# Patient Record
Sex: Male | Born: 1940 | Race: White | Hispanic: No | Marital: Married | Smoking: Former smoker
Health system: Southern US, Community
[De-identification: ages and names within clinical notes are randomized; demographics above are authoritative.]

## PROBLEM LIST (undated history)

## (undated) DIAGNOSIS — I10 Essential (primary) hypertension: Secondary | ICD-10-CM

## (undated) DIAGNOSIS — I219 Acute myocardial infarction, unspecified: Secondary | ICD-10-CM

## (undated) DIAGNOSIS — T4145XA Adverse effect of unspecified anesthetic, initial encounter: Secondary | ICD-10-CM

## (undated) DIAGNOSIS — E785 Hyperlipidemia, unspecified: Secondary | ICD-10-CM

## (undated) DIAGNOSIS — Z9889 Other specified postprocedural states: Secondary | ICD-10-CM

## (undated) DIAGNOSIS — T8859XA Other complications of anesthesia, initial encounter: Secondary | ICD-10-CM

## (undated) DIAGNOSIS — R413 Other amnesia: Principal | ICD-10-CM

## (undated) DIAGNOSIS — I4891 Unspecified atrial fibrillation: Secondary | ICD-10-CM

## (undated) DIAGNOSIS — I469 Cardiac arrest, cause unspecified: Secondary | ICD-10-CM

## (undated) DIAGNOSIS — G4730 Sleep apnea, unspecified: Secondary | ICD-10-CM

## (undated) DIAGNOSIS — M1990 Unspecified osteoarthritis, unspecified site: Secondary | ICD-10-CM

## (undated) DIAGNOSIS — R112 Nausea with vomiting, unspecified: Secondary | ICD-10-CM

## (undated) DIAGNOSIS — I519 Heart disease, unspecified: Secondary | ICD-10-CM

## (undated) HISTORY — PX: OTHER SURGICAL HISTORY: SHX169

## (undated) HISTORY — PX: HAND SURGERY: SHX662

## (undated) HISTORY — DX: Cardiac arrest, cause unspecified: I46.9

## (undated) HISTORY — DX: Acute myocardial infarction, unspecified: I21.9

## (undated) HISTORY — PX: CAROTID STENT: SHX1301

## (undated) HISTORY — DX: Heart disease, unspecified: I51.9

## (undated) HISTORY — DX: Essential (primary) hypertension: I10

## (undated) HISTORY — DX: Other amnesia: R41.3

## (undated) HISTORY — DX: Hyperlipidemia, unspecified: E78.5

## (undated) HISTORY — PX: APPENDECTOMY: SHX54

## (undated) HISTORY — DX: Sleep apnea, unspecified: G47.30

## (undated) HISTORY — DX: Unspecified atrial fibrillation: I48.91

---

## 2001-07-29 ENCOUNTER — Encounter: Admission: RE | Admit: 2001-07-29 | Discharge: 2001-07-29 | Payer: Self-pay | Admitting: Family Medicine

## 2001-07-29 ENCOUNTER — Encounter: Payer: Self-pay | Admitting: Family Medicine

## 2002-08-25 ENCOUNTER — Encounter: Admission: RE | Admit: 2002-08-25 | Discharge: 2002-09-30 | Payer: Self-pay | Admitting: Family Medicine

## 2004-09-22 ENCOUNTER — Encounter: Admission: RE | Admit: 2004-09-22 | Discharge: 2004-09-22 | Payer: Self-pay | Admitting: Family Medicine

## 2004-09-24 ENCOUNTER — Encounter: Admission: RE | Admit: 2004-09-24 | Discharge: 2004-09-24 | Payer: Self-pay | Admitting: Family Medicine

## 2006-05-06 ENCOUNTER — Emergency Department (HOSPITAL_COMMUNITY): Admission: EM | Admit: 2006-05-06 | Discharge: 2006-05-06 | Payer: Self-pay | Admitting: Emergency Medicine

## 2007-06-26 ENCOUNTER — Ambulatory Visit: Payer: Self-pay | Admitting: Cardiovascular Disease

## 2007-06-27 ENCOUNTER — Inpatient Hospital Stay (HOSPITAL_COMMUNITY): Admission: EM | Admit: 2007-06-27 | Discharge: 2007-06-29 | Payer: Self-pay | Admitting: Emergency Medicine

## 2007-06-27 HISTORY — PX: CORONARY ANGIOPLASTY WITH STENT PLACEMENT: SHX49

## 2007-10-26 ENCOUNTER — Encounter: Payer: Self-pay | Admitting: Internal Medicine

## 2007-10-26 ENCOUNTER — Ambulatory Visit (HOSPITAL_BASED_OUTPATIENT_CLINIC_OR_DEPARTMENT_OTHER): Admission: RE | Admit: 2007-10-26 | Discharge: 2007-10-26 | Payer: Self-pay | Admitting: Cardiology

## 2007-11-01 ENCOUNTER — Ambulatory Visit: Payer: Self-pay | Admitting: Internal Medicine

## 2008-07-13 ENCOUNTER — Encounter (INDEPENDENT_AMBULATORY_CARE_PROVIDER_SITE_OTHER): Payer: Self-pay | Admitting: *Deleted

## 2009-04-13 ENCOUNTER — Ambulatory Visit: Payer: Self-pay | Admitting: Internal Medicine

## 2009-04-13 DIAGNOSIS — M109 Gout, unspecified: Secondary | ICD-10-CM | POA: Insufficient documentation

## 2009-04-13 DIAGNOSIS — G4733 Obstructive sleep apnea (adult) (pediatric): Secondary | ICD-10-CM | POA: Insufficient documentation

## 2009-04-13 DIAGNOSIS — I469 Cardiac arrest, cause unspecified: Secondary | ICD-10-CM | POA: Insufficient documentation

## 2009-04-13 DIAGNOSIS — I219 Acute myocardial infarction, unspecified: Secondary | ICD-10-CM | POA: Insufficient documentation

## 2009-04-13 DIAGNOSIS — E785 Hyperlipidemia, unspecified: Secondary | ICD-10-CM | POA: Insufficient documentation

## 2009-04-13 DIAGNOSIS — I10 Essential (primary) hypertension: Secondary | ICD-10-CM | POA: Insufficient documentation

## 2009-04-21 ENCOUNTER — Encounter: Payer: Self-pay | Admitting: Internal Medicine

## 2009-05-10 ENCOUNTER — Encounter: Payer: Self-pay | Admitting: Internal Medicine

## 2009-05-24 ENCOUNTER — Telehealth: Payer: Self-pay | Admitting: Internal Medicine

## 2009-05-25 ENCOUNTER — Ambulatory Visit: Payer: Self-pay | Admitting: Internal Medicine

## 2009-11-18 ENCOUNTER — Ambulatory Visit: Payer: Self-pay | Admitting: Internal Medicine

## 2010-02-07 ENCOUNTER — Ambulatory Visit: Payer: Self-pay | Admitting: Cardiology

## 2010-05-09 NOTE — Letter (Signed)
Summary: Recall Colonoscopy Date Change Letter  [facility]  [address] [place], [place] [address]   Phone: [phone]  Fax: [phone]      July 13, 2008 MRN: [id number]   Benjamin George [address] [place], [place]  [address]   Dear [name],   Previously you were recommended to have a repeat colonoscopy around this time. Your chart was recently reviewed by Dr. Jarold Motto of [facility]. Follow up colonoscopy is now recommended in May 2015. This revised recommendation is based on current, nationally recognized guidelines for colorectal cancer screening and polyp surveillance. These guidelines are endorsed by the American Cancer Society, [employer] on Colorectal Cancer as well as numerous other major medical organizations.  Please understand that our recommendation assumes that you do not have any new symptoms such as bleeding, a change in bowel habits, anemia, or significant abdominal discomfort. If you do have any concerning GI symptoms or want to discuss the guideline recommendations, please call to arrange an office visit at your earliest convenience. Otherwise we will keep you in our reminder system and contact you 1-2 months prior to the date listed above to schedule your next colonoscopy.  Thank you,  Vania Rea. Jarold Motto, M.D.  [facility] [phone]

## 2010-05-09 NOTE — Assessment & Plan Note (Signed)
Summary: cpap/jd   Copy to:  Dr. Delfin Edis. Primary Provider/Referring Provider:  Dr. Pearson Grippe  CC:  Sleep Consult-had sleep study 10/2007.  c/o waking up several times in the night for years. .  History of Present Illness: April 13, 2009- 70 yoM seen at kind request of Dr Deborah Chalk because of sleep apnea. He had history of snore and daytime sleepiness. A sleep study 10/26/07 showed moderate obstructive sleep apnea , AHI 16.4/hr, and Periodic Limb Movement with 8.2 arousals/hr. He didn't follow up to try cpap then. The issue is raised now because blood pressure control has been difficult. He is being told still of loud snoring. Nose is congested lying in bed, and he drinks water at night due to sore throats, Bedtime 930-10PM, 1/2 hr sleep latency, waking 2-3 times/ night before up at 8AM, no naps., infrequent caffeine. Exercises at gym but stays heavy. Previous septoplasty.  Preventive Screening-Counseling & Management  Alcohol-Tobacco     Smoking Status: quit  Current Medications (verified): 1)  Atenolol 50 Mg Tabs (Atenolol) .... Two Times A Day 2)  Imdur 30 Mg Xr24h-Tab (Isosorbide Mononitrate) .... Once Daily 3)  Lipitor 10 Mg Tabs (Atorvastatin Calcium) .... Once Daily 4)  Valturna 300-320 Mg Tabs (Aliskiren-Valsartan) .... Once Daily 5)  Allopurinol 100 Mg Tabs (Allopurinol) .... Once Daily 6)  Bayer Aspirin 325 Mg Tabs (Aspirin) .... Once Daily 7)  Multivitamins  Tabs (Multiple Vitamin) .... Once Daily 8)  Vitamin C 500 Mg Tabs (Ascorbic Acid) .... Once Daily  Allergies (verified): 1)  ! Vibramycin 2)  Codeine   Past History:  Family History: Last updated: 04/13/2009 Father- hx heart disease, died "Guillain Barre" MGM deceased from ca- ? type Mother- died Alzheimers  Social History: Last updated: 04/13/2009 married Patient states former smoker. quit in 1989.  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 type Mother- died Alzheimers  Social History: married Patient states former smoker. quit in 1989.  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  The patient denies shortness of breath with activity, shortness of breath at rest, productive cough, non-productive cough, coughing up blood, chest pain, acid heartburn, indigestion, loss of appetite, weight change, abdominal pain, difficulty swallowing, tooth/dental problems, headaches, sneezing, itching, ear ache, anxiety, depression, hand/feet swelling, joint stiffness or pain, rash, change in color of mucus, and fever.    Vital Signs:  Patient profile:   70 year old male Height:      71 inches Weight:      268 pounds BMI:     37.51 O2 Sat:      95 % on Room air Pulse rate:   57 / minute BP sitting:   124 / 86  (right arm) Cuff size:   large  Vitals Entered By: Gweneth Dimitri RN (April 13, 2009 10:39 AM)  O2 Flow:  Room air CC: Sleep Consult-had sleep study 10/2007.  c/o waking up several times  in the night for years.  Comments Medications reviewed with patient Gweneth Dimitri RN  April 13, 2009 10:40 AM     Physical Exam  Additional Exam:  General: A/Ox3; pleasant and cooperative, NAD, SKIN: no rash, lesions NODES: no lymphadenopathy HEENT: [place]/AT, EOM- WNL, Conjuctivae- clear, PERRLA, TM-WNL, Nose- clear, Throat- clear and  wnl, Mellampatti  III NECK: Supple w/ fair ROM, JVD- none, normal carotid impulses w/o bruits Thyroid- normal to palpation CHEST: Clear to P&A HEART: RRR, no m/g/r heard ABDOMEN: Soft and nl; nml bowel sounds; no organomegaly or masses noted ZOX:WRUE, nl pulses, no edema  NEURO: Grossly intact to observation      Impression & Recommendations:  Problem # 1:  OBSTRUCTIVE SLEEP APNEA (ICD-327.23) Moderate OSA when tested in 2009. Nothing suggests he has improved. We discussed sleep apnea as a possible contributor to his hypertension. We discussed the physiology and medical issues as well as common treatment modalities. We emphasized weight loss, sleep hygiene and driving safety. We wil get him started with autotitration CPAP, and convert to fixed pressure after download data is available. We can give temazepam to help if needed during the adjustment.  Medications Added to Medication List This Visit: 1)  Atenolol 50 Mg Tabs (Atenolol) .... Two times a day 2)  Imdur 30 Mg Xr24h-tab (Isosorbide mononitrate) .... Once daily 3)  Lipitor 10 Mg Tabs (Atorvastatin calcium) .... Once daily 4)  Valturna 300-320 Mg Tabs (Aliskiren-valsartan) .... Once daily 5)  Allopurinol 100 Mg Tabs (Allopurinol) .... Once daily 6)  Bayer Aspirin 325 Mg Tabs (Aspirin) .... Once daily 7)  Multivitamins Tabs (Multiple vitamin) .... Once daily 8)  Vitamin C 500 Mg Tabs (Ascorbic acid) .... Once daily 9)  Temazepam 15 Mg Caps (Temazepam) .Marland Kitchen.. 1 for sleep if needed 10)  Cpap   Other Orders: Consultation Level IV (45409) DME Referral (DME)  Patient Instructions: 1)  Please schedule a follow-up appointment in 71 month. 2)  See [facility] to get started with CPAP. Feel free to call us and the home care company as needed. 3)  Script for temazepam to help with sleep if needed Prescriptions: TEMAZEPAM 15 MG CAPS (TEMAZEPAM) 1 for sleep if needed  #20 x 1   Entered and Authorized by:   Waymon Budge MD   Signed by:    Waymon Budge MD on 04/13/2009   Method used:   Print then Give to Patient   RxID:   [id number]

## 2010-05-09 NOTE — Assessment & Plan Note (Signed)
Summary: 6 weeks/apc   Copy to:  Dr. Delfin Edis. Primary Provider/Referring Provider:  Dr. Pearson Grippe  CC:  Follow up visit-sleep; "Lightheaded" feelings..  History of Present Illness: April 13, 2009- 68 yoM seen at kind request of Dr Deborah Chalk because of sleep apnea. He had history of snore and daytime sleepiness. A sleep study 10/26/07 showed moderate obstructive sleep apnea , AHI 16.4/hr, and Periodic Limb Movement with 8.2 arousals/hr. He didn't follow up to try cpap then. The issue is raised now because blood pressure control has been difficult. He is being told still of loud snoring. Nose is congested lying in bed, and he drinks water at night due to sore throats, Bedtime 930-10PM, 1/2 hr sleep latency, waking 2-3 times/ night before up at 8AM, no naps., infrequent caffeine. Exercises at gym but stays heavy. Previous septoplasty.  May 25, 2009- OSA, CAD We have ordered CPAP change to fixed pressure at 11. He says he can use it every night, is comfortable, gets up much less, and is sleeping better. Full face mask and humidifier but still gets dry mouth at night.  November 18, 2009- OSA, CAD CPAP doesn't let him sleep quite as well- wakes more- and this is a change. Feels that lack of sleep makes him feel dizzy. Mouth is drier. Does sleep with CPAP every night and notes his BP has come down as he has been on CPAP. He's not sure what has changed about sleep quality and CPAP tolerance, since CPAP settings are not changed. Machine seems  to blow too hard.  Full face mask- known mouth breather.  Preventive Screening-Counseling & Management  Alcohol-Tobacco     Smoking Status: quit > 6 months  Current Medications (verified): 1)  Atenolol 50 Mg Tabs (Atenolol) .... Two Times A Day 2)  Imdur 30 Mg Xr24h-Tab (Isosorbide Mononitrate) .... Once Daily 3)  Lipitor 10 Mg Tabs (Atorvastatin Calcium) .... Once Daily 4)  Allopurinol 100 Mg Tabs (Allopurinol) .... Once Daily 5)  Bayer Aspirin 325  Mg Tabs (Aspirin) .... Once Daily 6)  Multivitamins  Tabs (Multiple Vitamin) .... Once Daily 7)  Vitamin C 500 Mg Tabs (Ascorbic Acid) .... Once Daily 8)  Cpap 11 [facility] 9)  Losartan Potassium-Hctz 100-12.5 Mg Tabs (Losartan Potassium-Hctz) .... Take 1 By Mouth Once Daily  Allergies: 1)  ! Vibramycin 2)  ! Hydrocodone 3)  ! * Pain Killers 4)  Codeine  Past History:  Past Medical History: Last updated: 04/13/2009 HYPERLIPIDEMIA (ICD-272.4) GOUT (ICD-274.9) SLEEP APNEA (ICD-780.57) NPSG 10/26/07- AHI 16.4 HEART ATTACK (ICD-410.90) HYPERTENSION (ICD-401.9) CARDIAC ARREST (ICD-427.5)  Past Surgical History: Last updated: 04/13/2009 angioplasty stent nasal septoplasty  Family History: Last updated: 04/13/2009 Father- hx heart disease, died "Guillain Barre" MGM deceased from ca- ? type Mother- died Alzheimers  Social History: Last updated: 04/13/2009 married Patient states former smoker. quit in 1989.  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    Vital Signs:  Patient profile:   70 year old male Height:      71 inches Weight:      271.13 pounds BMI:  37.95 O2 Sat:      96 % on Room air Pulse rate:   61 / minute BP sitting:   122 / 66  (left arm) Cuff size:   large  Vitals Entered By: Reynaldo Minium CMA (November 18, 2009 2:12 PM)  O2 Flow:  Room air CC: Follow up visit-sleep; "Lightheaded" feelings.   Physical Exam  Additional Exam:  General: A/Ox3; pleasant and cooperative, NAD, obese SKIN: no  rash, lesions NODES: no lymphadenopathy HEENT: [place]/AT, EOM- WNL, Conjuctivae- clear, PERRLA, TM-WNL, Nose- clear, Throat- clear and wnl, Mallampati  III NECK: Supple w/ fair ROM, JVD- none, normal carotid impulses w/o bruits Thyroid- normal to palpation CHEST: Clear to P&A HEART: RRR, no m/g/r heard ABDOMEN: Soft and nl; NFA:OZHY, nl pulses, no edema  NEURO: Grossly intact to observation      Impression & Recommendations:  Problem # 1:  OBSTRUCTIVE SLEEP APNEA (ICD-327.23)  I can't tell why he has begun having more CPAP discomfort. It sounds as if he may be over pressured. We will try empirical reduction to 10 cwp, whyich should help noise concern and drying.  Medications Added to Medication List This Visit: 1)  Cpap 10 [facility]   Other Orders: Est. Patient Level III (86578)  Patient Instructions: 1)  Please schedule a follow-up appointment in 6 months. 2)  Ask [facility] to reduce CPAP to 10. let me know if that is not comfortable.

## 2010-05-09 NOTE — Progress Notes (Signed)
Summary: Autotitrated cpap to 11  Phone Note Other Incoming   Summary of Call: [facility]- Auto cpap to 11. Good compliance and control. Initial call taken by: Waymon Budge MD,  May 24, 2009 10:22 PM    New/Updated Medications: * CPAP 11 [facility]

## 2010-05-09 NOTE — Assessment & Plan Note (Signed)
Summary: 4 weeks/ mbw   Copy to:  Dr. Delfin Edis. Primary Provider/Referring Provider:  Dr. Pearson Grippe  CC:  follow up visit-.  History of Present Illness: April 13, 2009- 70 yoM seen at kind request of Dr Deborah Chalk because of sleep apnea. He had history of snore and daytime sleepiness. A sleep study 10/26/07 showed moderate obstructive sleep apnea , AHI 16.4/hr, and Periodic Limb Movement with 8.2 arousals/hr. He didn't follow up to try cpap then. The issue is raised now because blood pressure control has been difficult. He is being told still of loud snoring. Nose is congested lying in bed, and he drinks water at night due to sore throats, Bedtime 930-10PM, 1/2 hr sleep latency, waking 2-3 times/ night before up at 8AM, no naps., infrequent caffeine. Exercises at gym but stays heavy. Previous septoplasty.  May 25, 2009- OSA, CAD We have ordered CPAP change to fixed pressure at 11. He says he can use it every night, is comfortable, gets up much less, and is sleeping better. Full face mask and humidifier but still gets dry mouth at night.    Current Medications (verified): 1)  Atenolol 50 Mg Tabs (Atenolol) .... Two Times A Day 2)  Imdur 30 Mg Xr24h-Tab (Isosorbide Mononitrate) .... Once Daily 3)  Lipitor 10 Mg Tabs (Atorvastatin Calcium) .... Once Daily 4)  Allopurinol 100 Mg Tabs (Allopurinol) .... Once Daily 5)  Bayer Aspirin 325 Mg Tabs (Aspirin) .... Once Daily 6)  Multivitamins  Tabs (Multiple Vitamin) .... Once Daily 7)  Vitamin C 500 Mg Tabs (Ascorbic Acid) .... Once Daily 8)  Cpap 11 [facility] 9)  Losartan Potassium-Hctz 100-12.5 Mg Tabs (Losartan Potassium-Hctz) .... Take 1 By Mouth Once Daily  Allergies: 1)  ! Vibramycin 2)  ! Hydrocodone 3)  Codeine  Past History:  Past Medical History: Last updated: 04/13/2009 HYPERLIPIDEMIA (ICD-272.4) GOUT (ICD-274.9) SLEEP APNEA (ICD-780.57) NPSG 10/26/07- AHI 16.4 HEART ATTACK (ICD-410.90) HYPERTENSION (ICD-401.9) CARDIAC  ARREST (ICD-427.5)  Past Surgical History: Last updated: 04/13/2009 angioplasty stent nasal septoplasty  Family History: Last updated: 04/13/2009 Father- hx heart disease, died "Guillain Barre" MGM deceased from ca- ? type Mother- died Alzheimers  Social History: Last updated: 04/13/2009 married Patient states former smoker. quit in 1989.  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    Vital Signs:  Patient profile:   70 year old male Height:      71 inches Weight:      274.38 pounds BMI:     38.41 O2 Sat:      97 % on Room air Pulse rate:   58 / minute BP sitting:   128 / 70  (left arm) Cuff size:   large  Vitals Entered By: Reynaldo Minium CMA (May 25, 2009 10:10 AM)  O2 Flow:  Room air  Physical Exam  Additional Exam:  General: A/Ox3; pleasant and cooperative, NAD, obese SKIN: no rash, lesions NODES: no lymphadenopathy HEENT: [place]/AT, EOM- WNL, Conjuctivae- clear, PERRLA, TM-WNL, Nose- clear, Throat- clear and wnl, Mellampatti  III NECK: Supple w/ fair ROM, JVD- none, normal carotid impulses w/o bruits Thyroid- normal to palpation CHEST: Clear to P&A HEART: RRR, no m/g/r heard ABDOMEN: Soft and nl;  ZOX:WRUE, nl pulses, no edema  NEURO: Grossly intact to observation      Impression & Recommendations:  Problem # 1:  OBSTRUCTIVE SLEEP APNEA (ICD-327.23)  Great compliance and control. We are changing to fixed pressure at 11. Weight loss would help.  Medications Added to Medication List This Visit: 1)  Losartan Potassium-hctz 100-12.5 Mg Tabs (Losartan potassium-hctz) .... Take 1 by mouth once  daily  Other Orders: Est. Patient Level III (45409)  Patient Instructions: 1)  Please schedule a follow-up appointment in 6 months. 2)  Continue CPAP. We are setting fixed pressure at 11. If you aren't comfortable let the home care company or me know.

## 2010-05-17 ENCOUNTER — Ambulatory Visit (INDEPENDENT_AMBULATORY_CARE_PROVIDER_SITE_OTHER): Payer: MEDICARE | Admitting: Internal Medicine

## 2010-05-17 ENCOUNTER — Encounter: Payer: Self-pay | Admitting: Internal Medicine

## 2010-05-17 DIAGNOSIS — J309 Allergic rhinitis, unspecified: Secondary | ICD-10-CM | POA: Insufficient documentation

## 2010-05-17 DIAGNOSIS — G4700 Insomnia, unspecified: Secondary | ICD-10-CM

## 2010-05-17 DIAGNOSIS — G4733 Obstructive sleep apnea (adult) (pediatric): Secondary | ICD-10-CM

## 2010-05-17 DIAGNOSIS — J302 Other seasonal allergic rhinitis: Secondary | ICD-10-CM | POA: Insufficient documentation

## 2010-05-25 NOTE — Assessment & Plan Note (Signed)
Summary: 6 mth//kp   Copy to:  Dr. Delfin Edis. Primary Provider/Referring Provider:  Dr. Pearson Grippe  CC:  6  month follow up visit-OSA; uses CPAP each night and no complaints..  History of Present Illness: May 25, 2009- OSA, CAD We have ordered CPAP change to fixed pressure at 11. He says he can use it every night, is comfortable, gets up much less, and is sleeping better. Full face mask and humidifier but still gets dry mouth at night.  November 18, 2009- OSA, CAD CPAP doesn't let him sleep quite as well- wakes more- and this is a change. Feels that lack of sleep makes him feel dizzy. Mouth is drier. Does sleep with CPAP every night and notes his BP has come down as he has been on CPAP. He's not sure what has changed about sleep quality and CPAP tolerance, since CPAP settings are not changed. Machine seems  to blow too hard.  Full face mask- known mouth breather.  May 17, 2010- OSA, CAD Nurse-CC: 6  month follow up visit-OSA; uses CPAP each night and no complaints. CPAP 10/ [facility]- Uses all night every night. Denies naps, but continues to wake for 2 hours, unable to get back to sleep. Denies snoring. Gets up to bathroom then can't get back to sleep- tosses and turns. Denies other significant health events since here last.  Admits to seasonal rhinitis Spring and Fall- I offerred help if needed.    Preventive Screening-Counseling & Management  Alcohol-Tobacco     Smoking Status: quit > 6 months     Packs/Day: 2.0     Year Started: age 53     Year Quit: 23 years ago  Current Medications (verified): 1)  Atenolol 50 Mg Tabs (Atenolol) .... Two Times A Day 2)  Imdur 30 Mg Xr24h-Tab (Isosorbide Mononitrate) .... Once Daily 3)  Lipitor 10 Mg Tabs (Atorvastatin Calcium) .... Once Daily 4)  Allopurinol 100 Mg Tabs (Allopurinol) .... Once Daily 5)  Bayer Aspirin 325 Mg Tabs (Aspirin) .... Once Daily 6)  Multivitamins  Tabs (Multiple Vitamin) .... Once Daily 7)   Vitamin C 500 Mg Tabs (Ascorbic Acid) .... Once Daily 8)  Cpap 10 [facility] 9)  Losartan Potassium-Hctz 100-12.5 Mg Tabs (Losartan Potassium-Hctz) .... Take 1 By Mouth Once Daily 10)  Aleve 220 Mg Tabs (Naproxen Sodium) .... Take 1-2 By Mouth Once Daily As Needed 11)  Meloxicam 7.5 Mg Tabs (Meloxicam) .... Take 1-2 By Mouth Once Daily As Needed  Allergies (verified): 1)  ! Vibramycin 2)  ! Hydrocodone 3)  ! * Pain Killers 4)  Codeine  Past History:  Past Medical History: Last updated: 04/13/2009 HYPERLIPIDEMIA (ICD-272.4) GOUT (ICD-274.9) SLEEP APNEA (ICD-780.57) NPSG 10/26/07- AHI 16.4 HEART ATTACK (ICD-410.90) HYPERTENSION (ICD-401.9) CARDIAC ARREST (ICD-427.5)  Past Surgical History: Last updated: 04/13/2009 angioplasty stent nasal septoplasty  Family History: Last updated: 04/13/2009 Father- hx heart disease, died "Guillain Barre" MGM deceased from ca- ? type Mother- died Alzheimers  Social History: Last updated: 04/13/2009 married Patient states former smoker. quit in 1989.  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  The patient denies shortness of breath at rest, productive cough,  non-productive cough, coughing up blood, chest pain, irregular heartbeats, acid heartburn, indigestion, loss of appetite, weight change, abdominal pain, difficulty swallowing, sore throat, tooth/dental problems, headaches, ear ache, rash, change in color of mucus, and fever.    Vital Signs:  Patient profile:   70 year old male Height:      71 inches Weight:      277.25 pounds BMI:     38.81 O2 Sat:      96 % on Room air Pulse rate:   62 / minute BP  sitting:   120 / 72  (right arm) Cuff size:   large  Vitals Entered By: Reynaldo Minium CMA (May 17, 2010 9:23 AM)  O2 Flow:  Room air CC: 6  month follow up visit-OSA; uses CPAP each night and no complaints.   Physical Exam  Additional Exam:  General: A/Ox3; pleasant and cooperative, NAD, obese SKIN: no rash, lesions NODES: no lymphadenopathy HEENT: [place]/AT, EOM- WNL, Conjuctivae- clear, PERRLA, TM-WNL, Nose- clear, Throat- clear and wnl, Mallampati  III NECK: Supple w/ fair ROM, JVD- none, normal carotid impulses w/o bruits Thyroid- normal to palpation CHEST: Clear to P&A HEART: RRR, no m/g/r heard ABDOMEN: Seriously overweight EAV:WUJW, nl pulses, no edema  NEURO: Grossly intact to observation      Impression & Recommendations:  Problem # 1:  OBSTRUCTIVE SLEEP APNEA (ICD-327.23)  Good compliance and control with CPAP- no changes indicated for now., Weight loss would help  Orders: Est. Patient Level IV (11914)  Problem # 2:  INSOMNIA (ICD-780.52)  Complaint of difficulty maintaining sleep. Discussed light levels when he wakes for bathroom at night. We will let him try Sonata for short half life.   His updated medication list for this problem includes:    Zaleplon 10 Mg Caps (Zaleplon) .Marland Kitchen... 1 for sleep if needed  Problem # 3:  ALLERGIC RHINITIS (ICD-477.9)  He admits seasonal rhinitis. It isn't obviously interfering with CPAP use or sleep. We can offer treatment if needed.   Orders: Est. Patient Level IV (78295)  Medications Added to Medication List This Visit: 1)  Aleve 220 Mg Tabs (Naproxen sodium) .... Take 1-2 by mouth once daily as needed 2)  Meloxicam 7.5 Mg Tabs (Meloxicam) .... Take 1-2 by mouth once daily as needed 3)  Zaleplon 10 Mg Caps (Zaleplon) .Marland Kitchen.. 1 for sleep if needed  Patient Instructions: 1)  Please schedule a follow-up appointment in 1 year. 2)  Try zaleplon to help get back to sleep if needed. 3)  Continue CPAP at 10   Prescriptions: ZALEPLON 10 MG CAPS (ZALEPLON) 1 for sleep if needed  #30 x 5   Entered and Authorized by:   Waymon Budge MD   Signed by:   Waymon Budge MD on 05/17/2010   Method used:   Print then Give to Patient   RxID:   [id number]

## 2010-08-22 NOTE — H&P (Signed)
NAME:  Benjamin George, Benjamin George NO.:  [id number]   MEDICAL RECORD NO.:  [id number]          PATIENT TYPE:  EMS   LOCATION:  MAJO                         FACILITY:  [facility]   PHYSICIAN:  Brayton El, MD    DATE OF BIRTH:  Oct 29, 1940   DATE OF ADMISSION:  06/26/2007  DATE OF DISCHARGE:                              HISTORY & PHYSICAL   The patient is going to [facility].  The doctor is Dr.  Deborah Chalk who is his primary cardiologist.   REFERRING PHYSICIAN:  Colleen Can. Deborah Chalk, M.D.   CHIEF COMPLAINT:  Chest pain and palpitations.   HISTORY OF PRESENT ILLNESS:  Benjamin George is a 70 year old white male  with a past medical history significant for coronary artery disease  status post myocardial infarction at the ge of 70 and subsequent PCA and  an additional PCI approximately 5 years ago, hypertension,  hyperlipidemia, chronic back pain, presenting with persistent  palpitations and new onset chest pain with exertion today.  Patient  states that he has had problems with palpitations over the years;  however, in the past several weeks they have been worsening, especially  with exertion.  He underwent an exercise stress Cardiolite two to three  days ago but is unaware of the results.  Today, while at a game, he had  palpitations and new onset of substernal chest discomfort without  radiation or associated symptoms with exertion.  He reported to the  emergency room where his EKG was within normal limits and his first set  of enzymes was negative.  Patient is currently chest pain free.  Other  than increase in frequency of his palpitations, the new-onset chest  pain, patient has been in his normal state of health.   PAST MEDICAL HISTORY:  As above.   SOCIAL HISTORY:  Patient has a history of tobacco use but stopped  smoking after his first MI, also uses alcohol occasionally.   FAMILY HISTORY:  Family history is positive for premature coronary  disease.   ALLERGIES:   Codeine and Vibramycin.   MEDICATIONS:  Patient is unaware of his doses, but he is taking aspirin,  Atenolol, Imdur, allopurinol, Lipitor, Lisinopril and Aleve.   REVIEW OF SYSTEMS:  As in HPI.  All other systems were reviewed and are  negative.   PHYSICAL EXAMINATION:  VITAL SIGNS:  Temperature of 97.8, blood pressure  110/60, pulse of 61, respirations 18, satting 97% on 2 liters O2.  GENERAL:  He is in no acute distress.  HEENT:  Normocephalic, atraumatic.  NECK:  Supple without JVD or carotid bruit.  HEART:  Regular rate and rhythm without murmur or gallop.  LUNGS:  Clear bilaterally.  ABDOMEN:  Soft, nontender, nondistended.  EXTREMITIES:  Without edema.  NEUROLOGIC:  Nonfocal.  SKIN:  Warm and dry without any rashes.  PSYCHIATRIC:  Patient is appropriate with normal levels of insight.   EKG, independently reviewed by myself, demonstrates a normal sinus  rhythm with first-degree AV block.  There are no ST or T-wave segment  abnormalities.   Labs from the emergency room show a sodium of 138,  potassium 4.6,  chloride 106, BUN 14, glucose 111.  White count 8.5, hemoglobin 13.9,  hematocrit 40.4, platelet count of 242.  Troponin less than 0.05.  CKMB  less than 1.0.  Myoglobin 43.8.   ASSESSMENT:  A 70 year old with known coronary disease, presenting with  new-onset chest pain.   PLAN:  We will admit the patient for observation, rule him out for acute  myocardial infarction.  We will continue his home medications and only  start anticoagulation if the patient were to rule in, at which point we  would start heparin and a IIb3 inhibitor.  Patient had a stress test  approximately 2-3 days ago.  Unfortunately, we do not have the results  of this stress test this evening.  His future management will be based  on the results of that stress test.  He will be made n.p.o. in case  further procedures are warranted.      Brayton El, MD  Electronically Signed     SGA/MEDQ   D:  06/27/2007  T:  06/27/2007  Job:  [id number]

## 2010-08-22 NOTE — Cardiovascular Report (Signed)
NAME:  VERDUN, RACKLEY NO.:  [id number]   MEDICAL RECORD NO.:  [id number]          PATIENT TYPE:  INP   LOCATION:  4733                         FACILITY:  [facility]   PHYSICIAN:  Colleen Can. Deborah Chalk, M.D.DATE OF BIRTH:  Apr 20, 1940   DATE OF PROCEDURE:  06/27/2007  DATE OF DISCHARGE:                            CARDIAC CATHETERIZATION   PROCEDURE:  Left heart catheterization with selective coronary  angiography, left ventricular angiography, with Angio-Seal.   TYPE AND SITE OF ENTRY:  Percutaneous right femoral artery.   CATHETERS:  6-French four curved Judkins right and left coronary  catheters, 6-French pigtail ventriculographic catheter.   CONTRAST MATERIAL:  Omnipaque.   MEDICATIONS GIVEN PRIOR TO PROCEDURE:  Valium 10 mg.   MEDICATIONS GIVEN DURING PROCEDURE:  Versed 2 mg IV.   COMMENTS:  The patient tolerated the procedure well.   HEMODYNAMIC DATA:  The aortic pressure initially was 134/74.  It  increased after the injection of the coronaries.  The left ventricular  pressure initially was 178/11-27.  Post angiograph left ventricular  angiogram, the LV was 164/10-26, and the aortic pressure was 151/75.   ANGIOGRAPHIC DATA:  1. Left main coronary artery is normal.  2. Left circumflex.  Left circumflex had a stent in its proximal      segment.  It bifurcated into two moderate-size marginal vessels,      and then two smaller branches on the inferior aspect of the heart.      There were minor irregularities in the left circumflex, but no      obstructive disease.  3. Left anterior descending.  The left anterior descending gave rise      to high diagonal vessel, as well as a left anterior descending that      continued to cross the apex.  Left anterior descending distally was      small and somewhat tortuous, but flow completed to his terminal      portions was brisk.  There was no significant obstructive disease,      only minor irregularities.  4. Right  coronary artery.  The right coronary artery was heavily      calcified.  There was 60% ostial stenosis at the level of heavy      calcification, but there was no catheter damping at the ostium.      There were irregularities throughout, with 40%-60% narrowing.  The      vessel overall was somewhat diffusely narrowed, but still a 2.5 mm      vessel.  There was good flow in the posterior descending and      posterolateral branches.   Left ventricular angiogram was performed in the RAO projection.  Overall  cardiac size was normal.  There was apical akinesis and inferior  hypokinesis.  The global ejection fraction would be estimated to be in  the 45% range.  There was no mitral regurgitation, intracardiac  calcification, or intracavitary filling defect.   OVERALL IMPRESSION:  1. 60% ostial stenosis in the right coronary artery, with diffuse      calcification and atherosclerosis in the right coronary artery,  with patent stent in the left circumflex, with scattered      irregularities in the left coronary tree.  2. Mild left ventricular dysfunction, with inferior hypokinesis and      apical akinesis, with ejection fraction of approximately 45%.   DISCUSSION:  In light of these findings, I think we can manage Mr.  Sizemore medically.  We will look for other sources of his chest pain.      Colleen Can. Deborah Chalk, M.D.  Electronically Signed     SNT/MEDQ  D:  06/27/2007  T:  06/27/2007  Job:  [id number]

## 2010-08-25 NOTE — Procedures (Signed)
NAME:  GAYLON, BENTZ NO.:  [id number]   MEDICAL RECORD NO.:  [id number]          PATIENT TYPE:  OUT   LOCATION:  SLEEP CENTER                 FACILITY:  [facility]   PHYSICIAN:  Clinton D. Maple Hudson, MD, FCCP, FACPDATE OF BIRTH:  04-18-39   DATE OF STUDY:                            NOCTURNAL POLYSOMNOGRAM   REFERRING PHYSICIAN:  Colleen Can. Deborah Chalk, M.D.   INDICATION FOR STUDY:  Hypersomnia with sleep apnea.   EPWORTH SLEEPINESS SCORE:  Indicated by patient as zero, BMI 34.4,  weight 254 pounds, height 72 inches, neck 19 inches.   MEDICATIONS:  Home medication charted and reviewed.   SLEEP ARCHITECTURE:  Total sleep time of 292.5 minutes with sleep  efficiency 80.1%.  Stage I was 11.3%.  Stage II 69.6%.  Stage III 4.8%.  REM 14.4% of total sleep time.  Sleep latency 10.5 minutes.  REM latency  95 minutes.  Awake after sleep onset 59 minutes.  Arousal index 16.8.  The first 2 hours of sleep were marked by frequent nonspecific wakings.  No bedtime medication taken.   RESPIRATORY DATA:  Apnea/hypopnea index (AHI) 16.4 per hour.  Eighty  events were scored including 6 obstructive apneas and 74 hypopneas.  Events were nonpositional, but more common while supine.  REM AHI 10 per  hour.  A diagnostic NPSG protocol was ordered and CPAP titration was not  attempted.   OXYGEN DATA:  Moderately loud snoring with oxygen desaturation to a  nadir of 83%.  Mean oxygen saturation through the study was 92% on room  air.   CARDIAC DATA:  Normal sinus rhythm.   MOVEMENT-PARASOMNIA:  Periodic limb movement with arousal.  A total of  439 limb jerks were counted averaging 90.1 per hour.  Of these, 40  events counted as periodic limb movement with arousal for an  index of  8.2 per hour.   IMPRESSIONS-RECOMMENDATIONS:  1. Moderate obstructive sleep apnea/hypopnea syndrome, AHI 16.4 per      hour with nonpositional events more common while supine.      Moderately loud snoring and  oxygen desaturation to a nadir of 83%.  2. A diagnostic NPSG protocol was ordered and followed.  Consider      return for CPAP titration or evaluate for alternative management as      appropriate.  3. Periodic limb movement syndrome with arousal, 8.2 per hour.  He may      benefit from specific therapy such as Requip or Mirapex if      clinically appropriate.  4. The patient states his practice at home is to drink water      throughout the night expecting nocturia 3-4 times.  He      had some difficulty returning to sleep after a bathroom trip.  He      woke at 3:53 a.m. claiming to be rested and requesting to the end      the study at that time.      Clinton D. Maple Hudson, MD, FCCP, FACP  Diplomate, Biomedical engineer of Sleep Medicine  Electronically Signed     CDY/MEDQ  D:  11/01/2007 11:48:11  T:  11/01/2007 14:34:16  Job:  [id number] 

## 2010-08-25 NOTE — Discharge Summary (Signed)
NAME:  Benjamin George, Benjamin George NO.:  [id number]   MEDICAL RECORD NO.:  [id number]          PATIENT TYPE:  INP   LOCATION:  4733                         FACILITY:  [facility]   PHYSICIAN:  Colleen Can. Deborah Chalk, M.D.DATE OF BIRTH:  1940/09/11   DATE OF ADMISSION:  06/26/2007  DATE OF DISCHARGE:  06/29/2007                               DISCHARGE SUMMARY   DISCHARGE DIAGNOSES:  1. Chest pain and palpitations with subsequent elective cardiac      catheterization showing a 60% ostial stenosis in the right coronary      with diffuse calcification and atherosclerosis in the right      coronary, patent stent in the left circumflex, scattered      irregularities in the left coronary tree, mild left ventricular      dysfunction with an ejection fraction of approximately 45%, he is      felt best to be served by medical management.  2. Symptomatic premature ventricular contractions, improved with      higher doses of beta-blocker.  3. Known ischemic heart disease with previous myocardial infarction at      age 32 and subsequent percutaneous coronary intervention and      stenting of the left circumflex 5 years ago.  4. Hypertension.  5. Hyperlipidemia.  6. Chronic back pain.  7. Obesity.   HISTORY OF PRESENT ILLNESS:  Benjamin George is a very pleasant 70 year old  white male who has known ischemic heart disease as well as hypertension  and high cholesterol.  He underwent a recent Cardiolite study for  followup, which did show inferior ischemia with normal left-ventricular  systolic function.  Our plans were to proceed on with cardiac  catheterization.  However, the patient presented to the emergency room  with complaints of chest pain and substernal chest discomfort while he  was attending a basketball game.  There was no radiation or associated  symptoms reported.  When he came to the emergency room, his EKG was  unremarkable, his cardiac enzymes were negative, he was subsequently  pain  free yet admitted for further evaluation.   Please see the history and physical for further patient's presentation  and profile.   LABORATORY DATA ON ADMISSION:  His cardiac enzymes were negative.  His  chemistries were normal.  CBC was normal as well.  His EKG was within  normal limits.  Chest x-ray showed left base atelectasis.  The heart  sounds and mediastinal contours were normal.   PROCEDURES PERFORMED:  1. Cardiac catheterization performed on June 27, 2007, those results      are as noted above.  2. Gallbladder ultrasound, basically negative, the pancreas was      obscured by bowel gas.  3. CT angiogram of the chest performed on June 28, 2007, showing no      evidence of pulmonary embolism.  There was cardiomegaly and      coronary artery calcifications with diffuse degenerative changes      noted throughout the thoracic spine.   HOSPITAL COURSE:  The patient was admitted electively.  He ruled out  negative for myocardial infarction.  We proceeded on the following  morning with cardiac catheterization, those results are as noted above.  He is felt to best be served by medical management in regards to his  coronary disease.  His medicines were adjusted accordingly and our plans  were for him to be discharged the next morning; however, he began to  have significant presyncopal type episodes with ambulation.  These were  witnessed by the on-call physician over the course of the weekend and  noted to correlate with PVCs.  His beta-blocker dose was once again  doubled with a nice response.  By Sunday, on June 29, 2007, he was  doing well without complaints and was felt to be satisfactory candidate  for discharge.   DISCHARGE CONDITION:  Stable.   DISCHARGE DIET:  Low salt, heart healthy.   ACTIVITY:  To be increased as tolerated.   He is to use an ice pack if needed to the groin.   DISCHARGE MEDICINES:  1. Lipitor 20 mg a day.  2. Enteric-coated aspirin 325 a day.  3.  Multivitamin daily.  4. Allopurinol 100 mg a day.  5. Atenolol was increased to 50 mg b.i.d.  6. Lisinopril 10 mg a day.  7. Nitro patch 0.4 mg a day, put on at bedtime and removed the      following day at supper time.  8. Protonix 40 mg a day.  9. Nitroglycerin p.r.n.   PLAN:  I will see him back in the office in 1 week, certainly sooner if  any problems arise in the interim.      Sharlee Blew, N.P.      Colleen Can. Deborah Chalk, M.D.  Electronically Signed    LC/MEDQ  D:  06/30/2007  T:  07/01/2007  Job:  [id number]

## 2010-10-16 ENCOUNTER — Other Ambulatory Visit: Payer: Self-pay | Admitting: Cardiology

## 2010-10-16 MED ORDER — ATORVASTATIN CALCIUM 10 MG PO TABS: 10.0000 mg | ORAL_TABLET | Freq: Every day | ORAL | Status: DC

## 2010-10-16 NOTE — Telephone Encounter (Signed)
Called in needing a refill of his lipitor, but, wanted to know if he can take the generic brand of lipitor instead because it is one third of the price. Please call back. I have pulled his chart.

## 2010-10-16 NOTE — Telephone Encounter (Signed)
Ok to switch to generic? 

## 2010-10-16 NOTE — Telephone Encounter (Signed)
Med refill sent to [facility] on [address] by e-scribe

## 2010-10-19 ENCOUNTER — Encounter: Payer: Self-pay | Admitting: Cardiology

## 2010-10-24 ENCOUNTER — Encounter: Payer: Self-pay | Admitting: Cardiology

## 2010-10-24 ENCOUNTER — Ambulatory Visit (INDEPENDENT_AMBULATORY_CARE_PROVIDER_SITE_OTHER): Payer: Medicare Other | Admitting: Cardiology

## 2010-10-24 VITALS — BP 143/81 | HR 59 | Resp 16 | Ht 72.0 in | Wt 266.0 lb

## 2010-10-24 DIAGNOSIS — E785 Hyperlipidemia, unspecified: Secondary | ICD-10-CM

## 2010-10-24 DIAGNOSIS — I10 Essential (primary) hypertension: Secondary | ICD-10-CM

## 2010-10-24 DIAGNOSIS — Z01818 Encounter for other preprocedural examination: Secondary | ICD-10-CM | POA: Insufficient documentation

## 2010-10-24 DIAGNOSIS — I2510 Atherosclerotic heart disease of native coronary artery without angina pectoris: Secondary | ICD-10-CM | POA: Insufficient documentation

## 2010-10-24 NOTE — Assessment & Plan Note (Signed)
Stable with no ischemic symptoms.  Continue ASA, beta blocker, Imdur, statin, ARB.  Last EF was 45% in 3/09, will get an echocardiogram to reassess LV systolic function.

## 2010-10-24 NOTE — Assessment & Plan Note (Signed)
BP slightly elevated here but has been well-controlled at home.

## 2010-10-24 NOTE — Progress Notes (Signed)
PCP: Dr. Selena Batten  70 yo patient with history of CAD presents for cardiology followup.  He has been seen by Dr. Deborah Chalk in the past, and I am seeing him for the first time today.  He had RCA angioplasty in 1990 and PCI to CFX in 1997. Left heart cath in 2009 showed 60% ostial RCA stenosis and patent CFX stent.  He has been doing well recently.  No chest pain. He does yardwork and works out at [employer] twice a week with no exertional dyspnea.  He takes his BP daily, and it has always been < 140 systolic when he checks it at home.  He continues to use his CPAP for OSA.  No claudication-type symptoms.  He has osteoarthritis of the left shoulder and is considering a shoulder replacement.   ECG: NSR, slight concave ST elevation in inferior leads.    PMH: 1. OSA: CPAP 2. HTN 3. Obesity 4. PVCs 5. CAD: Inferior MI in 1990 treated with tPA then RCA angioplasty.  PCI to CFX in 1997.  LHC (3/09) with 60% ostial RCA, patent CFX stent, EF 45%.  6. Gout 7. Low back pain   SH: Married, retired from [employer], stopped smoking in 1989, occasional ETOH.   FH: Premature CAD  ROS: All systems reviewed and negative except as per HPI.   Current Outpatient Prescriptions  Medication Sig Dispense Refill  . allopurinol (ZYLOPRIM) 100 MG tablet Take 100 mg by mouth daily.        Marland Kitchen aspirin 325 MG tablet Take 325 mg by mouth daily.        Marland Kitchen atenolol (TENORMIN) 50 MG tablet Take 50 mg by mouth 2 (two) times daily.        Marland Kitchen atorvastatin (LIPITOR) 10 MG tablet Take 1 tablet (10 mg total) by mouth daily.  30 tablet  5  . isosorbide mononitrate (IMDUR) 30 MG 24 hr tablet Take 30 mg by mouth daily.        Marland Kitchen losartan-hydrochlorothiazide (HYZAAR) 100-12.5 MG per tablet Take 1 tablet by mouth daily.        . meloxicam (MOBIC) 7.5 MG tablet Take 7.5-15 mg by mouth daily as needed.        . Multiple Vitamin (MULTIVITAMIN) tablet Take 1 tablet by mouth daily.        . naproxen sodium (ANAPROX) 220 MG tablet Take 220-440 mg by  mouth daily as needed.        . NON FORMULARY CPAP 10 advanced       . vitamin C (ASCORBIC ACID) 500 MG tablet Take 500 mg by mouth daily.          BP 143/81  Pulse 59  Resp 16  Ht 6' (1.829 m)  Wt 266 lb (120.657 kg)  BMI 36.08 kg/m2 General: NAD, obese Neck: Thick neck, no JVD, no thyromegaly or thyroid nodule.  Lungs: Clear to auscultation bilaterally with normal respiratory effort. CV: Nondisplaced PMI.  Heart regular S1/S2, no S3/S4, no murmur.  No peripheral edema.  No carotid bruit.  2+ PT pulse on left and trace PT pulse on right.  Abdomen: Soft, nontender, no hepatosplenomegaly, no distention.  Neurologic: Alert and oriented x 3.  Psych: Normal affect. Extremities: No clubbing or cyanosis.

## 2010-10-24 NOTE — Assessment & Plan Note (Signed)
He will see his PCP in 2 weeks and will get lipids at that time.  I asked him to send [place] a copy.  Goal LDL < 70.

## 2010-10-24 NOTE — Assessment & Plan Note (Signed)
Patient may undergo left shoulder surgery soon.  He has had no ischemic symptoms and has good exercise tolerance.  He should continue atenolol peri-operatively.  No stress testing is indicated at this time.

## 2010-10-24 NOTE — Patient Instructions (Signed)
Schedule an appointment for an echocardiogram.  Your physician wants you to follow-up in: 6 months with Dr Shirlee Latch.(January 2013). You will receive a reminder letter in the mail two months in advance. If you don't receive a letter, please call our office to schedule the follow-up appointment.

## 2010-11-01 ENCOUNTER — Encounter: Payer: Self-pay | Admitting: Cardiology

## 2010-11-09 ENCOUNTER — Telehealth: Payer: Self-pay | Admitting: Cardiology

## 2010-11-09 ENCOUNTER — Ambulatory Visit (HOSPITAL_COMMUNITY): Payer: Medicare Other | Attending: Internal Medicine

## 2010-11-09 DIAGNOSIS — I252 Old myocardial infarction: Secondary | ICD-10-CM | POA: Insufficient documentation

## 2010-11-09 DIAGNOSIS — G4730 Sleep apnea, unspecified: Secondary | ICD-10-CM | POA: Insufficient documentation

## 2010-11-09 DIAGNOSIS — E785 Hyperlipidemia, unspecified: Secondary | ICD-10-CM | POA: Insufficient documentation

## 2010-11-09 DIAGNOSIS — I10 Essential (primary) hypertension: Secondary | ICD-10-CM | POA: Insufficient documentation

## 2010-11-09 DIAGNOSIS — Z01810 Encounter for preprocedural cardiovascular examination: Secondary | ICD-10-CM | POA: Insufficient documentation

## 2010-11-09 DIAGNOSIS — Z87891 Personal history of nicotine dependence: Secondary | ICD-10-CM | POA: Insufficient documentation

## 2010-11-09 DIAGNOSIS — I2510 Atherosclerotic heart disease of native coronary artery without angina pectoris: Secondary | ICD-10-CM | POA: Insufficient documentation

## 2010-11-09 NOTE — Telephone Encounter (Signed)
Walk In Pt Form " Pt Dropped off Labs from [facility]" sent to Message Nurse  11/09/10/km

## 2010-11-24 ENCOUNTER — Other Ambulatory Visit: Payer: Self-pay | Admitting: *Deleted

## 2010-11-24 MED ORDER — ISOSORBIDE MONONITRATE ER 30 MG PO TB24: 30.0000 mg | ORAL_TABLET | Freq: Every day | ORAL | Status: DC

## 2010-11-24 NOTE — Telephone Encounter (Signed)
rx sent in today

## 2010-11-28 ENCOUNTER — Inpatient Hospital Stay (HOSPITAL_COMMUNITY): Admission: RE | Admit: 2010-11-28 | Payer: Medicare Other | Source: Ambulatory Visit | Admitting: Orthopedic Surgery

## 2011-01-01 LAB — BASIC METABOLIC PANEL
BUN: 13
CO2: 27
Calcium: 8.7
Chloride: 103
Creatinine, Ser: 0.94
GFR calc Af Amer: >60
GFR calc non Af Amer: >60
Glucose, Bld: 90
Potassium: 4.2
Sodium: 139

## 2011-01-01 LAB — CBC
HCT: 38.3 — ABNORMAL LOW
HCT: 40.4
Hemoglobin: 13.1
Hemoglobin: 13.9
MCHC: 34.3
MCHC: 34.5
MCV: 91.7
MCV: 92.4
Platelets: 212
Platelets: 242
RBC: 4.14 — ABNORMAL LOW
RBC: 4.41
RDW: 12.8
RDW: 13
WBC: 8.5
WBC: 8.5

## 2011-01-01 LAB — I-STAT 8, (EC8 V) (CONVERTED LAB)
Acid-Base Excess: 2
BUN: 14
Bicarbonate: 28.6 — ABNORMAL HIGH
Chloride: 106
Glucose, Bld: 111 — ABNORMAL HIGH
HCT: 42
Hemoglobin: 14.3
Operator id: 270651
Potassium: 4.6
Sodium: 138
TCO2: 30
pCO2, Ven: 50.1 — ABNORMAL HIGH
pH, Ven: 7.364 — ABNORMAL HIGH

## 2011-01-01 LAB — CK TOTAL AND CKMB (NOT AT ARMC)
CK, MB: 1.3
Relative Index: INVALID
Total CK: 65

## 2011-01-01 LAB — DIFFERENTIAL
Basophils Absolute: 0
Basophils Relative: 0
Eosinophils Absolute: 0.2
Eosinophils Relative: 2
Lymphocytes Relative: 27
Lymphs Abs: 2.3
Monocytes Absolute: 0.6
Monocytes Relative: 8
Neutro Abs: 5.4
Neutrophils Relative %: 63

## 2011-01-01 LAB — CARDIAC PANEL(CRET KIN+CKTOT+MB+TROPI)
CK, MB: 1.4
CK, MB: 1.4
Relative Index: INVALID
Relative Index: INVALID
Total CK: 63
Total CK: 65
Troponin I: 0.02
Troponin I: 0.04

## 2011-01-01 LAB — POCT CARDIAC MARKERS
CKMB, poc: <1 — ABNORMAL LOW
Myoglobin, poc: 43.8
Operator id: 270651
Troponin i, poc: <0.05

## 2011-01-01 LAB — POCT I-STAT CREATININE
Creatinine, Ser: 1.1
Operator id: 270651

## 2011-01-01 LAB — TROPONIN I: Troponin I: 0.02

## 2011-03-22 ENCOUNTER — Other Ambulatory Visit: Payer: Self-pay | Admitting: Orthopedic Surgery

## 2011-03-23 ENCOUNTER — Ambulatory Visit (INDEPENDENT_AMBULATORY_CARE_PROVIDER_SITE_OTHER): Payer: Medicare Other | Admitting: Cardiology

## 2011-03-23 ENCOUNTER — Encounter: Payer: Self-pay | Admitting: Cardiology

## 2011-03-23 ENCOUNTER — Ambulatory Visit: Payer: Medicare Other | Admitting: Cardiology

## 2011-03-23 DIAGNOSIS — E785 Hyperlipidemia, unspecified: Secondary | ICD-10-CM

## 2011-03-23 DIAGNOSIS — I10 Essential (primary) hypertension: Secondary | ICD-10-CM

## 2011-03-23 DIAGNOSIS — Z01818 Encounter for other preprocedural examination: Secondary | ICD-10-CM

## 2011-03-23 DIAGNOSIS — I2510 Atherosclerotic heart disease of native coronary artery without angina pectoris: Secondary | ICD-10-CM

## 2011-03-23 NOTE — Patient Instructions (Signed)
   Your physician wants you to follow-up in: 6 months with Dr McLean. (June 2013). You will receive a reminder letter in the mail two months in advance. If you don't receive a letter, please call our office to schedule the follow-up appointment.  

## 2011-03-25 NOTE — Assessment & Plan Note (Signed)
Stable with no ischemic symptoms.  Continue ASA, beta blocker, Imdur, statin, ARB.  EF was normal on recent echo.

## 2011-03-25 NOTE — Assessment & Plan Note (Signed)
Will have him ask PCP to fax over labs when they are next done.  Goal LDL < 70.

## 2011-03-25 NOTE — Assessment & Plan Note (Signed)
BP occasionally runs low in the afternoon.  I will have him take losartan/HCT in the evening rather than in the morning.

## 2011-03-25 NOTE — Progress Notes (Signed)
PCP: Dr. Selena Batten  70 yo patient with history of CAD presents for cardiology followup.  He had RCA angioplasty in 1990 and PCI to CFX in 1997. Left heart cath in 2009 showed 60% ostial RCA stenosis and patent CFX stent.  Echo in 8/12 showed normal EF with mild MR and AI.  He has been doing well recently.  No chest pain. He does yardwork and works out at [employer] twice a week with no exertional dyspnea.  He takes his BP daily, and it has always been < 140 systolic when he checks it at home.  His BP actually drops into the 90s systolic range sometimes in the afternoon, causing mild lightheadedness.  He continues to use his CPAP for OSA.  No claudication-type symptoms.  He is planning to have a shoulder replacement in January.   ECG: NSR, slight concave ST elevation in inferior leads.    PMH: 1. OSA: CPAP 2. HTN 3. Obesity 4. PVCs 5. CAD: Inferior MI in 1990 treated with tPA then RCA angioplasty.  PCI to CFX in 1997.  LHC (3/09) with 60% ostial RCA, patent CFX stent, EF 45%.  Echo (8/12): EF 55-65%, grade I diastolic dysfunction, mild MR, mild AI.  6. Gout 7. Low back pain   SH: Married, retired from [employer], stopped smoking in 1989, occasional ETOH.   FH: Premature CAD   Current Outpatient Prescriptions  Medication Sig Dispense Refill  . allopurinol (ZYLOPRIM) 100 MG tablet Take 100 mg by mouth daily.        Marland Kitchen aspirin 325 MG tablet Take 325 mg by mouth daily.        Marland Kitchen atenolol (TENORMIN) 50 MG tablet Take 50 mg by mouth 2 (two) times daily.        Marland Kitchen atorvastatin (LIPITOR) 10 MG tablet Take 1 tablet (10 mg total) by mouth daily.  30 tablet  5  . isosorbide mononitrate (IMDUR) 30 MG 24 hr tablet Take 1 tablet (30 mg total) by mouth daily.  30 tablet  6  . losartan-hydrochlorothiazide (HYZAAR) 100-12.5 MG per tablet Take 1 tablet by mouth daily.        . meloxicam (MOBIC) 7.5 MG tablet Take 7.5-15 mg by mouth daily as needed.        . Multiple Vitamin (MULTIVITAMIN) tablet Take 1 tablet by mouth  daily.        . naproxen sodium (ANAPROX) 220 MG tablet Take 220-440 mg by mouth daily as needed.        . NON FORMULARY CPAP 10 advanced       . vitamin C (ASCORBIC ACID) 500 MG tablet Take 500 mg by mouth daily.          BP 140/80  Pulse 68  Ht 6' (1.829 m)  Wt 119.75 kg (264 lb)  BMI 35.80 kg/m2 General: NAD, obese Neck: Thick neck, no JVD, no thyromegaly or thyroid nodule.  Lungs: Clear to auscultation bilaterally with normal respiratory effort. CV: Nondisplaced PMI.  Heart regular S1/S2, no S3/S4, no murmur.  No peripheral edema.  No carotid bruit.  2+ PT pulse on left and trace PT pulse on right.  Abdomen: Soft, nontender, no hepatosplenomegaly, no distention.  Neurologic: Alert and oriented x 3.  Psych: Normal affect. Extremities: No clubbing or cyanosis.

## 2011-03-25 NOTE — Assessment & Plan Note (Signed)
Patient will undergo left shoulder surgery soon.  He has had no ischemic symptoms and has good exercise tolerance.  He should continue atenolol peri-operatively.  No stress testing is indicated at this time.

## 2011-03-30 ENCOUNTER — Encounter (HOSPITAL_COMMUNITY): Payer: Self-pay

## 2011-04-12 ENCOUNTER — Encounter (HOSPITAL_COMMUNITY)
Admission: RE | Admit: 2011-04-12 | Discharge: 2011-04-12 | Disposition: A | Discharge status: Home / Self Care | Payer: Medicare Other | Source: Ambulatory Visit | Attending: Orthopedic Surgery | Admitting: Orthopedic Surgery

## 2011-04-12 ENCOUNTER — Encounter (HOSPITAL_COMMUNITY): Payer: Self-pay

## 2011-04-12 ENCOUNTER — Other Ambulatory Visit: Payer: Self-pay

## 2011-04-12 LAB — URINALYSIS, ROUTINE W REFLEX MICROSCOPIC
Bilirubin Urine: NEGATIVE
Glucose, UA: NEGATIVE mg/dL
Hgb urine dipstick: NEGATIVE
Ketones, ur: NEGATIVE mg/dL
Leukocytes, UA: NEGATIVE
Nitrite: NEGATIVE
Protein, ur: NEGATIVE mg/dL
Specific Gravity, Urine: 1.013 (ref 1.005–1.030)
Urobilinogen, UA: 0.2 mg/dL (ref 0.0–1.0)
pH: 6 (ref 5.0–8.0)

## 2011-04-12 LAB — CBC
HCT: 40.8 % (ref 39.0–52.0)
Hemoglobin: 14.1 g/dL (ref 13.0–17.0)
MCH: 31.4 pg (ref 26.0–34.0)
MCHC: 34.6 g/dL (ref 30.0–36.0)
MCV: 90.9 fL (ref 78.0–100.0)
Platelets: 206 10*3/uL (ref 150–400)
RBC: 4.49 MIL/uL (ref 4.22–5.81)
RDW: 12.5 % (ref 11.5–15.5)
WBC: 7.8 10*3/uL (ref 4.0–10.5)

## 2011-04-12 LAB — APTT: aPTT: 30 seconds (ref 24–37)

## 2011-04-12 LAB — COMPREHENSIVE METABOLIC PANEL
ALT: 28 U/L (ref 0–53)
AST: 19 U/L (ref 0–37)
Albumin: 3.8 g/dL (ref 3.5–5.2)
Alkaline Phosphatase: 65 U/L (ref 39–117)
BUN: 14 mg/dL (ref 6–23)
CO2: 27 mEq/L (ref 19–32)
Calcium: 9.4 mg/dL (ref 8.4–10.5)
Chloride: 101 mEq/L (ref 96–112)
Creatinine, Ser: 1.02 mg/dL (ref 0.50–1.35)
GFR calc Af Amer: 83 mL/min — ABNORMAL LOW (ref >90)
GFR calc non Af Amer: 72 mL/min — ABNORMAL LOW (ref >90)
Glucose, Bld: 114 mg/dL — ABNORMAL HIGH (ref 70–99)
Potassium: 4.4 mEq/L (ref 3.5–5.1)
Sodium: 136 mEq/L (ref 135–145)
Total Bilirubin: 0.5 mg/dL (ref 0.3–1.2)
Total Protein: 6.6 g/dL (ref 6.0–8.3)

## 2011-04-12 LAB — DIFFERENTIAL
Basophils Absolute: 0 10*3/uL (ref 0.0–0.1)
Basophils Relative: 0 % (ref 0–1)
Eosinophils Absolute: 0.2 10*3/uL (ref 0.0–0.7)
Eosinophils Relative: 3 % (ref 0–5)
Lymphocytes Relative: 32 % (ref 12–46)
Lymphs Abs: 2.5 10*3/uL (ref 0.7–4.0)
Monocytes Absolute: 0.6 10*3/uL (ref 0.1–1.0)
Monocytes Relative: 8 % (ref 3–12)
Neutro Abs: 4.4 10*3/uL (ref 1.7–7.7)
Neutrophils Relative %: 57 % (ref 43–77)

## 2011-04-12 LAB — ABO/RH: ABO/RH(D): AB POS

## 2011-04-12 LAB — SURGICAL PCR SCREEN
MRSA, PCR: NEGATIVE
Staphylococcus aureus: NEGATIVE

## 2011-04-12 LAB — TYPE AND SCREEN
ABO/RH(D): AB POS
Antibody Screen: NEGATIVE

## 2011-04-12 LAB — PROTIME-INR
INR: 0.99 (ref 0.00–1.49)
Prothrombin Time: 13.3 seconds (ref 11.6–15.2)

## 2011-04-12 NOTE — Pre-Procedure Instructions (Signed)
19 Benjamin George  04/12/2011   Your procedure is scheduled on:  Apr 16, 2010 Tuesday    Report to [facility] at 1140 AM.  Call this number if you have problems the morning of surgery: [phone]   Remember:   Do not eat food:After Midnight.  May have clear liquids: up to 4 Hours before arrival.  Clear liquids include soda, tea, black coffee, apple or grape juice, broth.  Take these medicines the morning of surgery with A SIP OF WATER: atenolol, imdur    Do not wear jewelry, make-up or nail polish.  Do not wear lotions, powders, or perfumes. You may wear deodorant.  Do not shave 48 hours prior to surgery.  Do not bring valuables to the hospital.  Contacts, dentures or bridgework may not be worn into surgery.  Leave suitcase in the car. After surgery it may be brought to your room.  For patients admitted to the hospital, checkout time is 11:00 AM the day of discharge.   Patients discharged the day of surgery will not be allowed to drive home.  Name and phone number of your driver: NA  Special Instructions: CHG Shower Use Special Wash: 1/2 bottle night before surgery and 1/2 bottle morning of surgery.   Please read over the following fact sheets that you were given: Pain Booklet, MRSA Information and Surgical Site Infection Prevention

## 2011-04-13 ENCOUNTER — Telehealth: Payer: Self-pay | Admitting: Cardiology

## 2011-04-13 NOTE — Telephone Encounter (Signed)
Advised pt to stay on ASA.

## 2011-04-13 NOTE — Telephone Encounter (Signed)
New Problem:    Patient is having shoulder surgery done by Dr. Jackquline Bosch and they would like to know if he should stop his asprin today. Please advise.

## 2011-04-16 MED ORDER — CEFAZOLIN SODIUM-DEXTROSE 2-3 GM-% IV SOLR
2.0000 g | INTRAVENOUS | Status: AC
  Administered 2011-04-17: 2 g via INTRAVENOUS
  Filled 2011-04-16: qty 50

## 2011-04-17 ENCOUNTER — Encounter (HOSPITAL_COMMUNITY): Payer: Self-pay | Admitting: Anesthesiology

## 2011-04-17 ENCOUNTER — Encounter (HOSPITAL_COMMUNITY)
Admission: RE | Disposition: A | Discharge status: Home / Self Care | Payer: Self-pay | Source: Ambulatory Visit | Attending: Orthopedic Surgery

## 2011-04-17 ENCOUNTER — Ambulatory Visit (HOSPITAL_COMMUNITY): Payer: Medicare Other

## 2011-04-17 ENCOUNTER — Inpatient Hospital Stay (HOSPITAL_COMMUNITY)
Admission: RE | Admit: 2011-04-17 | Discharge: 2011-04-19 | DRG: 484 | Disposition: A | Discharge status: Home / Self Care | Payer: Medicare Other | Source: Ambulatory Visit | Attending: Orthopedic Surgery | Admitting: Orthopedic Surgery

## 2011-04-17 ENCOUNTER — Ambulatory Visit (HOSPITAL_COMMUNITY): Payer: Medicare Other | Admitting: Anesthesiology

## 2011-04-17 DIAGNOSIS — G4733 Obstructive sleep apnea (adult) (pediatric): Secondary | ICD-10-CM | POA: Diagnosis present

## 2011-04-17 DIAGNOSIS — Z7982 Long term (current) use of aspirin: Secondary | ICD-10-CM

## 2011-04-17 DIAGNOSIS — Z888 Allergy status to other drugs, medicaments and biological substances status: Secondary | ICD-10-CM

## 2011-04-17 DIAGNOSIS — M19019 Primary osteoarthritis, unspecified shoulder: Principal | ICD-10-CM | POA: Diagnosis present

## 2011-04-17 DIAGNOSIS — I10 Essential (primary) hypertension: Secondary | ICD-10-CM | POA: Diagnosis present

## 2011-04-17 DIAGNOSIS — I2510 Atherosclerotic heart disease of native coronary artery without angina pectoris: Secondary | ICD-10-CM | POA: Diagnosis present

## 2011-04-17 DIAGNOSIS — M109 Gout, unspecified: Secondary | ICD-10-CM | POA: Diagnosis present

## 2011-04-17 DIAGNOSIS — I252 Old myocardial infarction: Secondary | ICD-10-CM

## 2011-04-17 DIAGNOSIS — E785 Hyperlipidemia, unspecified: Secondary | ICD-10-CM | POA: Diagnosis present

## 2011-04-17 DIAGNOSIS — M19012 Primary osteoarthritis, left shoulder: Secondary | ICD-10-CM

## 2011-04-17 DIAGNOSIS — Z886 Allergy status to analgesic agent status: Secondary | ICD-10-CM

## 2011-04-17 HISTORY — PX: TOTAL SHOULDER ARTHROPLASTY: SHX126

## 2011-04-17 SURGERY — ARTHROPLASTY, SHOULDER, TOTAL
Anesthesia: Choice | Site: Shoulder | Laterality: Left | Wound class: Clean

## 2011-04-17 MED ORDER — ACETAMINOPHEN 650 MG RE SUPP: 650.0000 mg | Freq: Four times a day (QID) | RECTAL | Status: DC | PRN

## 2011-04-17 MED ORDER — VITAMIN C 500 MG PO TABS
500.0000 mg | ORAL_TABLET | Freq: Every day | ORAL | Status: DC
  Administered 2011-04-18 – 2011-04-19 (×2): 500 mg via ORAL
  Filled 2011-04-17 (×3): qty 1

## 2011-04-17 MED ORDER — EPHEDRINE SULFATE 50 MG/ML IJ SOLN
INTRAMUSCULAR | Status: DC | PRN
  Administered 2011-04-17 (×3): 5 mg via INTRAVENOUS

## 2011-04-17 MED ORDER — NEOSTIGMINE METHYLSULFATE 1 MG/ML IJ SOLN
INTRAMUSCULAR | Status: DC | PRN
  Administered 2011-04-17: 5 mg via INTRAVENOUS

## 2011-04-17 MED ORDER — PHENYLEPHRINE HCL 10 MG/ML IJ SOLN
INTRAMUSCULAR | Status: DC | PRN
  Administered 2011-04-17 (×7): 40 ug via INTRAVENOUS
  Administered 2011-04-17: 80 ug via INTRAVENOUS

## 2011-04-17 MED ORDER — LACTATED RINGERS IV SOLN
INTRAVENOUS | Status: DC | PRN
  Administered 2011-04-17 (×3): via INTRAVENOUS

## 2011-04-17 MED ORDER — DOCUSATE SODIUM 100 MG PO CAPS
100.0000 mg | ORAL_CAPSULE | Freq: Two times a day (BID) | ORAL | Status: DC
  Administered 2011-04-18 – 2011-04-19 (×3): 100 mg via ORAL
  Filled 2011-04-17 (×4): qty 1

## 2011-04-17 MED ORDER — MENTHOL 3 MG MT LOZG: 1.0000 | LOZENGE | OROMUCOSAL | Status: DC | PRN

## 2011-04-17 MED ORDER — ASPIRIN 81 MG PO TABS: 160.0000 mg | ORAL_TABLET | Freq: Every day | ORAL | Status: DC

## 2011-04-17 MED ORDER — ALUM & MAG HYDROXIDE-SIMETH 200-200-20 MG/5ML PO SUSP: 30.0000 mL | ORAL | Status: DC | PRN

## 2011-04-17 MED ORDER — HYDROCHLOROTHIAZIDE 12.5 MG PO CAPS
12.5000 mg | ORAL_CAPSULE | Freq: Every day | ORAL | Status: DC
  Administered 2011-04-18 – 2011-04-19 (×2): 12.5 mg via ORAL
  Filled 2011-04-17 (×3): qty 1

## 2011-04-17 MED ORDER — ALLOPURINOL 100 MG PO TABS
100.0000 mg | ORAL_TABLET | Freq: Every day | ORAL | Status: DC
  Administered 2011-04-18 – 2011-04-19 (×2): 100 mg via ORAL
  Filled 2011-04-17 (×3): qty 1

## 2011-04-17 MED ORDER — METOCLOPRAMIDE HCL 5 MG/ML IJ SOLN
5.0000 mg | Freq: Three times a day (TID) | INTRAMUSCULAR | Status: DC | PRN
  Administered 2011-04-17: 10 mg via INTRAVENOUS
  Filled 2011-04-17: qty 2

## 2011-04-17 MED ORDER — KCL IN DEXTROSE-NACL 20-5-0.9 MEQ/L-%-% IV SOLN
INTRAVENOUS | Status: DC
  Administered 2011-04-17: 22:00:00 via INTRAVENOUS
  Filled 2011-04-17 (×7): qty 1000

## 2011-04-17 MED ORDER — LOSARTAN POTASSIUM 50 MG PO TABS
100.0000 mg | ORAL_TABLET | Freq: Every day | ORAL | Status: DC
  Administered 2011-04-18 – 2011-04-19 (×2): 100 mg via ORAL
  Filled 2011-04-17 (×3): qty 2

## 2011-04-17 MED ORDER — SENNA 8.6 MG PO TABS
1.0000 | ORAL_TABLET | Freq: Two times a day (BID) | ORAL | Status: DC
  Administered 2011-04-18 – 2011-04-19 (×3): 8.6 mg via ORAL
  Filled 2011-04-17 (×5): qty 1

## 2011-04-17 MED ORDER — LACTATED RINGERS IV SOLN
INTRAVENOUS | Status: DC
  Administered 2011-04-17: 13:00:00 via INTRAVENOUS

## 2011-04-17 MED ORDER — ASPIRIN 81 MG PO CHEW
162.0000 mg | CHEWABLE_TABLET | Freq: Every day | ORAL | Status: DC
  Administered 2011-04-18 – 2011-04-19 (×2): 162 mg via ORAL
  Filled 2011-04-17 (×3): qty 2

## 2011-04-17 MED ORDER — HYDROMORPHONE HCL PF 1 MG/ML IJ SOLN: 0.2500 mg | INTRAMUSCULAR | Status: DC | PRN

## 2011-04-17 MED ORDER — FENTANYL CITRATE 0.05 MG/ML IJ SOLN
INTRAMUSCULAR | Status: DC | PRN
  Administered 2011-04-17: 150 ug via INTRAVENOUS
  Administered 2011-04-17: 50 ug via INTRAVENOUS

## 2011-04-17 MED ORDER — VECURONIUM BROMIDE 10 MG IV SOLR
INTRAVENOUS | Status: DC | PRN
  Administered 2011-04-17: 10 mg via INTRAVENOUS
  Administered 2011-04-17: 1 mg via INTRAVENOUS
  Administered 2011-04-17: 5 mg via INTRAVENOUS

## 2011-04-17 MED ORDER — ONDANSETRON HCL 4 MG/2ML IJ SOLN
4.0000 mg | Freq: Four times a day (QID) | INTRAMUSCULAR | Status: DC | PRN
  Administered 2011-04-17: 4 mg via INTRAVENOUS

## 2011-04-17 MED ORDER — HETASTARCH-ELECTROLYTES 6 % IV SOLN
INTRAVENOUS | Status: DC | PRN
  Administered 2011-04-17: 17:00:00 via INTRAVENOUS

## 2011-04-17 MED ORDER — CEFAZOLIN SODIUM-DEXTROSE 2-3 GM-% IV SOLR
2.0000 g | Freq: Four times a day (QID) | INTRAVENOUS | Status: AC
  Administered 2011-04-17 – 2011-04-18 (×3): 2 g via INTRAVENOUS
  Filled 2011-04-17 (×3): qty 50

## 2011-04-17 MED ORDER — THROMBIN 5000 UNITS EX SOLR
CUTANEOUS | Status: DC | PRN
  Administered 2011-04-17: 5000 [IU] via TOPICAL

## 2011-04-17 MED ORDER — PROMETHAZINE HCL 25 MG/ML IJ SOLN
12.5000 mg | Freq: Four times a day (QID) | INTRAMUSCULAR | Status: DC | PRN
  Administered 2011-04-17: 12.5 mg via INTRAVASCULAR

## 2011-04-17 MED ORDER — FENTANYL CITRATE 0.05 MG/ML IJ SOLN
INTRAMUSCULAR | Status: AC
  Filled 2011-04-17: qty 2

## 2011-04-17 MED ORDER — DEXTROSE 5 % IV SOLN
500.0000 mg | Freq: Four times a day (QID) | INTRAVENOUS | Status: DC | PRN
  Filled 2011-04-17: qty 5

## 2011-04-17 MED ORDER — BISACODYL 5 MG PO TBEC: 5.0000 mg | DELAYED_RELEASE_TABLET | Freq: Every day | ORAL | Status: DC | PRN

## 2011-04-17 MED ORDER — PROMETHAZINE HCL 25 MG/ML IJ SOLN
INTRAMUSCULAR | Status: AC
  Administered 2011-04-17: 12.5 mg via INTRAVASCULAR
  Filled 2011-04-17: qty 1

## 2011-04-17 MED ORDER — LIDOCAINE HCL (CARDIAC) 20 MG/ML IV SOLN
INTRAVENOUS | Status: DC | PRN
  Administered 2011-04-17: 80 mg via INTRAVENOUS

## 2011-04-17 MED ORDER — POVIDONE-IODINE 7.5 % EX SOLN
Freq: Once | CUTANEOUS | Status: DC
  Filled 2011-04-17: qty 118

## 2011-04-17 MED ORDER — LOSARTAN POTASSIUM-HCTZ 100-12.5 MG PO TABS: 1.0000 | ORAL_TABLET | Freq: Every day | ORAL | Status: DC

## 2011-04-17 MED ORDER — ONDANSETRON HCL 4 MG PO TABS: 4.0000 mg | ORAL_TABLET | Freq: Four times a day (QID) | ORAL | Status: DC | PRN

## 2011-04-17 MED ORDER — VITAMIN C 500 MG PO TABS: 500.0000 mg | ORAL_TABLET | Freq: Every day | ORAL | Status: DC

## 2011-04-17 MED ORDER — MIDAZOLAM HCL 2 MG/2ML IJ SOLN
1.0000 mg | Freq: Once | INTRAMUSCULAR | Status: AC
  Administered 2011-04-17: 2 mg via INTRAVENOUS

## 2011-04-17 MED ORDER — DIPHENHYDRAMINE HCL 12.5 MG/5ML PO ELIX
12.5000 mg | ORAL_SOLUTION | ORAL | Status: DC | PRN
  Filled 2011-04-17: qty 10

## 2011-04-17 MED ORDER — GLYCOPYRROLATE 0.2 MG/ML IJ SOLN
INTRAMUSCULAR | Status: DC | PRN
  Administered 2011-04-17: .9 mg via INTRAVENOUS

## 2011-04-17 MED ORDER — ISOSORBIDE MONONITRATE ER 30 MG PO TB24
30.0000 mg | ORAL_TABLET | Freq: Every day | ORAL | Status: DC
  Administered 2011-04-18 – 2011-04-19 (×2): 30 mg via ORAL
  Filled 2011-04-17 (×3): qty 1

## 2011-04-17 MED ORDER — PROPOFOL 10 MG/ML IV EMUL
INTRAVENOUS | Status: DC | PRN
  Administered 2011-04-17: 200 mg via INTRAVENOUS

## 2011-04-17 MED ORDER — ZOLPIDEM TARTRATE 5 MG PO TABS: 5.0000 mg | ORAL_TABLET | Freq: Every evening | ORAL | Status: DC | PRN

## 2011-04-17 MED ORDER — METOCLOPRAMIDE HCL 5 MG PO TABS
5.0000 mg | ORAL_TABLET | Freq: Three times a day (TID) | ORAL | Status: DC | PRN
  Filled 2011-04-17: qty 2

## 2011-04-17 MED ORDER — ATENOLOL 50 MG PO TABS
50.0000 mg | ORAL_TABLET | Freq: Every day | ORAL | Status: DC
  Administered 2011-04-18 – 2011-04-19 (×2): 50 mg via ORAL
  Filled 2011-04-17 (×3): qty 1

## 2011-04-17 MED ORDER — ADULT MULTIVITAMIN W/MINERALS CH
1.0000 | ORAL_TABLET | Freq: Every day | ORAL | Status: DC
  Administered 2011-04-18 – 2011-04-19 (×2): 1 via ORAL
  Filled 2011-04-17 (×3): qty 1

## 2011-04-17 MED ORDER — MORPHINE SULFATE 2 MG/ML IJ SOLN
1.0000 mg | INTRAMUSCULAR | Status: DC | PRN
  Administered 2011-04-18: 1 mg via INTRAVENOUS
  Filled 2011-04-17: qty 1

## 2011-04-17 MED ORDER — SIMVASTATIN 20 MG PO TABS
20.0000 mg | ORAL_TABLET | Freq: Every day | ORAL | Status: DC
  Administered 2011-04-17 – 2011-04-19 (×3): 20 mg via ORAL
  Filled 2011-04-17 (×3): qty 1

## 2011-04-17 MED ORDER — PHENOL 1.4 % MT LIQD
1.0000 | OROMUCOSAL | Status: DC | PRN
  Filled 2011-04-17: qty 177

## 2011-04-17 MED ORDER — ONE-DAILY MULTI VITAMINS PO TABS: 1.0000 | ORAL_TABLET | Freq: Every day | ORAL | Status: DC

## 2011-04-17 MED ORDER — OXYCODONE-ACETAMINOPHEN 5-325 MG PO TABS
1.0000 | ORAL_TABLET | ORAL | Status: DC | PRN
  Administered 2011-04-18: 1 via ORAL
  Filled 2011-04-17: qty 1

## 2011-04-17 MED ORDER — MIDAZOLAM HCL 2 MG/2ML IJ SOLN
INTRAMUSCULAR | Status: AC
  Filled 2011-04-17: qty 2

## 2011-04-17 MED ORDER — ACETAMINOPHEN 325 MG PO TABS: 650.0000 mg | ORAL_TABLET | Freq: Four times a day (QID) | ORAL | Status: DC | PRN

## 2011-04-17 MED ORDER — SODIUM CHLORIDE 0.9 % IR SOLN
Status: DC | PRN
  Administered 2011-04-17: 1

## 2011-04-17 MED ORDER — DROPERIDOL 2.5 MG/ML IJ SOLN: 0.6250 mg | INTRAMUSCULAR | Status: DC | PRN

## 2011-04-17 MED ORDER — FENTANYL CITRATE 0.05 MG/ML IJ SOLN
50.0000 ug | INTRAMUSCULAR | Status: DC | PRN
  Administered 2011-04-17: 100 ug via INTRAVENOUS

## 2011-04-17 MED ORDER — METHOCARBAMOL 500 MG PO TABS
500.0000 mg | ORAL_TABLET | Freq: Four times a day (QID) | ORAL | Status: DC | PRN
  Administered 2011-04-18: 500 mg via ORAL
  Filled 2011-04-17: qty 1

## 2011-04-17 MED ORDER — ONDANSETRON HCL 4 MG/2ML IJ SOLN
INTRAMUSCULAR | Status: DC | PRN
  Administered 2011-04-17: 4 mg via INTRAVENOUS

## 2011-04-17 SURGICAL SUPPLY — 81 items
ASSEMBLY NECK TAPER FIXED 135 (Orthopedic Implant) ×1 IMPLANT
BLADE SAW SAG 73X25 THK (BLADE) ×1
BLADE SAW SGTL 73X25 THK (BLADE) ×1 IMPLANT
BLADE SURG 15 STRL LF DISP TIS (BLADE) ×1 IMPLANT
BLADE SURG 15 STRL SS (BLADE) ×2
BOWL SMART MIX CTS (DISPOSABLE) IMPLANT
CEMENT BONE DEPUY (Cement) ×2 IMPLANT
CHLORAPREP W/TINT 26ML (MISCELLANEOUS) ×2 IMPLANT
CLOTH BEACON ORANGE TIMEOUT ST (SAFETY) ×2 IMPLANT
COVER SURGICAL LIGHT HANDLE (MISCELLANEOUS) ×2 IMPLANT
DEPUY CMW 1 IMPLANT
DRAPE INCISE IOBAN 66X45 STRL (DRAPES) ×2 IMPLANT
DRAPE SURG 17X23 STRL (DRAPES) ×2 IMPLANT
DRAPE TABLE COVER HEAVY DUTY (DRAPES) ×1 IMPLANT
DRAPE U-SHAPE 47X51 STRL (DRAPES) ×2 IMPLANT
DRSG ADAPTIC 3X8 NADH LF (GAUZE/BANDAGES/DRESSINGS) ×1 IMPLANT
DRSG PAD ABDOMINAL 8X10 ST (GAUZE/BANDAGES/DRESSINGS) ×4 IMPLANT
ELECT REM PT RETURN 9FT ADLT (ELECTROSURGICAL) ×2
ELECTRODE REM PT RTRN 9FT ADLT (ELECTROSURGICAL) ×1 IMPLANT
EVACUATOR 1/8 PVC DRAIN (DRAIN) ×2 IMPLANT
GAUZE SPONGE 4X4 12PLY STRL LF (GAUZE/BANDAGES/DRESSINGS) ×1 IMPLANT
GLENOID ANCHOR PEG CROSSLK 48 (Orthopedic Implant) ×1 IMPLANT
GLOVE BIO SURGEON STRL SZ 6.5 (GLOVE) ×2 IMPLANT
GLOVE BIO SURGEON STRL SZ7 (GLOVE) ×1 IMPLANT
GLOVE BIO SURGEON STRL SZ7.5 (GLOVE) ×4 IMPLANT
GLOVE BIOGEL PI IND STRL 7.0 (GLOVE) IMPLANT
GLOVE BIOGEL PI IND STRL 7.5 (GLOVE) IMPLANT
GLOVE BIOGEL PI IND STRL 8 (GLOVE) ×1 IMPLANT
GLOVE BIOGEL PI INDICATOR 7.0 (GLOVE) ×1
GLOVE BIOGEL PI INDICATOR 7.5 (GLOVE) ×3
GLOVE BIOGEL PI INDICATOR 8 (GLOVE) ×1
GLOVE SURG SS PI 7.5 STRL IVOR (GLOVE) ×1 IMPLANT
GOWN PREVENTION PLUS XLARGE (GOWN DISPOSABLE) ×4 IMPLANT
GOWN STRL NON-REIN LRG LVL3 (GOWN DISPOSABLE) ×2 IMPLANT
GOWN STRL REIN XL XLG (GOWN DISPOSABLE) ×3 IMPLANT
HANDPIECE INTERPULSE COAX TIP (DISPOSABLE) ×2
HEAD ECC 48X18 (Orthopedic Implant) ×1 IMPLANT
HEMOSTAT SURGICEL 2X14 (HEMOSTASIS) ×2 IMPLANT
HOOD PEEL AWAY FACE SHEILD DIS (HOOD) ×4 IMPLANT
KIT BASIN OR (CUSTOM PROCEDURE TRAY) ×2 IMPLANT
KIT ROOM TURNOVER OR (KITS) ×2 IMPLANT
MANIFOLD NEPTUNE II (INSTRUMENTS) ×2 IMPLANT
NDL HYPO 25GX1X1/2 BEV (NEEDLE) ×1 IMPLANT
NDL MAYO TROCAR (NEEDLE) ×1 IMPLANT
NEEDLE HYPO 25GX1X1/2 BEV (NEEDLE) IMPLANT
NEEDLE MAYO TROCAR (NEEDLE) ×2 IMPLANT
NS IRRIG 1000ML POUR BTL (IV SOLUTION) ×2 IMPLANT
PACK SHOULDER (CUSTOM PROCEDURE TRAY) ×2 IMPLANT
PAD ARMBOARD 7.5X6 YLW CONV (MISCELLANEOUS) ×4 IMPLANT
PIN METAGLENE 2.5 (PIN) ×1 IMPLANT
RETRIEVER SUT HEWSON (MISCELLANEOUS) ×1 IMPLANT
SET HNDPC FAN SPRY TIP SCT (DISPOSABLE) ×1 IMPLANT
SLING ARM IMMOBILIZER LRG (SOFTGOODS) ×2 IMPLANT
SLING ARM IMMOBILIZER MED (SOFTGOODS) IMPLANT
SMARTMIX MINI TOWER (MISCELLANEOUS) ×2
SPONGE GAUZE 4X4 12PLY (GAUZE/BANDAGES/DRESSINGS) ×2 IMPLANT
SPONGE LAP 18X18 X RAY DECT (DISPOSABLE) ×2 IMPLANT
SPONGE LAP 4X18 X RAY DECT (DISPOSABLE) ×3 IMPLANT
STEM GLOBAL AP 12MM (Stem) ×1 IMPLANT
STRIP CLOSURE SKIN 1/2X4 (GAUZE/BANDAGES/DRESSINGS) ×3 IMPLANT
SUCTION FRAZIER TIP 10 FR DISP (SUCTIONS) ×2 IMPLANT
SUPPORT WRAP ARM LG (MISCELLANEOUS) ×1 IMPLANT
SUT ETHIBOND 2 OS 4 DA (SUTURE) ×3 IMPLANT
SUT FIBERWIRE #2 38 T-5 BLUE (SUTURE) ×6
SUT MNCRL AB 4-0 PS2 18 (SUTURE) ×2 IMPLANT
SUT SILK 2 0 TIES 17X18 (SUTURE)
SUT SILK 2-0 18XBRD TIE BLK (SUTURE) ×1 IMPLANT
SUT VIC AB 0 CT1 27 (SUTURE) ×2
SUT VIC AB 0 CT1 27XBRD ANBCTR (SUTURE) IMPLANT
SUT VIC AB 0 CTB1 27 (SUTURE) ×4 IMPLANT
SUT VIC AB 2-0 CT1 27 (SUTURE) ×4
SUT VIC AB 2-0 CT1 TAPERPNT 27 (SUTURE) ×3 IMPLANT
SUTURE FIBERWR #2 38 T-5 BLUE (SUTURE) ×5 IMPLANT
SYR CONTROL 10ML LL (SYRINGE) ×1 IMPLANT
SYRINGE TOOMEY DISP (SYRINGE) ×1 IMPLANT
TOWEL OR 17X24 6PK STRL BLUE (TOWEL DISPOSABLE) ×2 IMPLANT
TOWEL OR 17X26 10 PK STRL BLUE (TOWEL DISPOSABLE) ×2 IMPLANT
TOWER SMARTMIX MINI (MISCELLANEOUS) ×1 IMPLANT
TRAY FOLEY CATH 14FR (SET/KITS/TRAYS/PACK) IMPLANT
WATER STERILE IRR 1000ML POUR (IV SOLUTION) ×1 IMPLANT
YANKAUER SUCT BULB TIP NO VENT (SUCTIONS) ×2 IMPLANT

## 2011-04-17 NOTE — H&P (Signed)
Benjamin George is an 71 y.o. male.   Chief Complaint: L shoulder pain  HPI: L shoulder end stage arthritis, failed nonoperative treatment.  Past Medical History  Diagnosis Date  . HLD (hyperlipidemia)   . Gout   . Sleep apnea     NPSG 10/26/07 - AHI 16.4  . Heart attack   . HTN (hypertension)   . Cardiac arrest   . Gout     Past Surgical History  Procedure Date  . Angioplasty   . Carotid stent   . Nasal septopalsty   . Hand surgery     left     No family history on file. Social History:  reports that he has quit smoking. He does not have any smokeless tobacco history on file. He reports that he drinks alcohol. His drug history not on file.  Allergies:  Allergies  Allergen Reactions  . Codeine     REACTION: nausea  . Hydrocodone   . Vibramycin     Medications Prior to Admission  Medication Dose Route Frequency Provider Last Rate Last Dose  . ceFAZolin (ANCEF) IVPB 2 g/50 mL premix  2 g Intravenous 60 min Pre-Op Tsz-Yin So, PHARMD       Medications Prior to Admission  Medication Sig Dispense Refill  . allopurinol (ZYLOPRIM) 100 MG tablet Take 100 mg by mouth daily.        Marland Kitchen aspirin 325 MG tablet Take 325 mg by mouth daily.        Marland Kitchen atenolol (TENORMIN) 50 MG tablet Take 50 mg by mouth daily.       Marland Kitchen atorvastatin (LIPITOR) 10 MG tablet Take 1 tablet (10 mg total) by mouth daily.  30 tablet  5  . isosorbide mononitrate (IMDUR) 30 MG 24 hr tablet Take 1 tablet (30 mg total) by mouth daily.  30 tablet  6  . losartan-hydrochlorothiazide (HYZAAR) 100-12.5 MG per tablet Take 1 tablet by mouth daily.        . meloxicam (MOBIC) 7.5 MG tablet Take 7.5 mg by mouth daily as needed. For back pain.      . Multiple Vitamin (MULTIVITAMIN) tablet Take 1 tablet by mouth daily.          No results found for this or any previous visit (from the past 48 hour(s)). No results found.  Review of Systems  All other systems reviewed and are negative.    There were no vitals taken for  this visit. Physical Exam  Constitutional: He is oriented to person, place, and time. He appears well-developed and well-nourished.  HENT:  Head: Atraumatic.  Eyes: EOM are normal.  Neck: Neck supple.  Cardiovascular: Intact distal pulses.   Respiratory: Effort normal.  GI: Soft.  Musculoskeletal:       Left shoulder: He exhibits decreased range of motion and tenderness.  Neurological: He is alert and oriented to person, place, and time.  Skin: Skin is warm and dry.  Psychiatric: He has a normal mood and affect.     Assessment/Plan L shoulder OA, for  TSA. Risks / benefits of surgery discussed Consent on chart  NPO for OR Preop antibiotics   Ronnie Mallette WILLIAM 04/17/2011, 7:47 AM

## 2011-04-17 NOTE — Anesthesia Procedure Notes (Addendum)
Anesthesia Regional Block:  Interscalene brachial plexus block  Pre-Anesthetic Checklist: ,, timeout performed, Correct Patient, Correct Site, Correct Laterality, Correct Procedure,, site marked, risks and benefits discussed, Surgical consent,  Pre-op evaluation,  At surgeon's request and post-op pain management  Laterality: Left  Prep: chloraprep       Needles:  Injection technique: Single-shot  Needle Type: Echogenic Stimulator Needle     Needle Length: 5cm 5 cm Needle Gauge: 22 and 22 G    Additional Needles:  Procedures: ultrasound guided and nerve stimulator Interscalene brachial plexus block  Nerve Stimulator or Paresthesia:  Response: bicep contraction, 0.45 mA,   Additional Responses:   Narrative:  Start time: 04/17/2011 1:41 PM End time: 04/17/2011 1:53 PM Injection made incrementally with aspirations every 5 mL.  Performed by: Personally  Anesthesiologist: J. Adonis Huguenin, MD  Additional Notes: Functioning IV was confirmed and monitors applied.  A 50mm 22ga echogenic arrow stimulator was used. Sterile prep and drape,hand hygiene and sterile gloves were used.Ultrasound guidance: relevent anatomy identified, needle position confirmed, local anesthetic spread visualized around nerve(s)., vascular puncture avoided.  Image printed for medical record.  Negative aspiration and negative test dose prior to incremental administration of local anesthetic. The patient tolerated the procedure well.  Interscalene brachial plexus block Procedure Name: Intubation Date/Time: 04/17/2011 2:49 PM Performed by: Carmela Rima Pre-anesthesia Checklist: Patient identified, Timeout performed, Emergency Drugs available, Suction available and Patient being monitored Patient Re-evaluated:Patient Re-evaluated prior to inductionOxygen Delivery Method: Circle System Utilized Preoxygenation: Pre-oxygenation with 100% oxygen Intubation Type: IV induction Ventilation: Mask ventilation without  difficulty Laryngoscope Size: Mac and 4 Grade View: Grade II Tube type: Oral Tube size: 8.0 mm Number of attempts: 1 Placement Confirmation: ETT inserted through vocal cords under direct vision,  breath sounds checked- equal and bilateral,  positive ETCO2 and CO2 detector Secured at: 23 cm Tube secured with: Tape Dental Injury: Teeth and Oropharynx as per pre-operative assessment

## 2011-04-17 NOTE — Transfer of Care (Signed)
Immediate Anesthesia Transfer of Care Note  Patient: Benjamin George  Procedure(s) Performed:  TOTAL SHOULDER ARTHROPLASTY  Patient Location: PACU  Anesthesia Type: GA combined with regional for post-op pain  Level of Consciousness: awake, alert  and oriented  Airway & Oxygen Therapy: Patient Spontanous Breathing and Patient connected to face mask oxygen  Post-op Assessment: Report given to PACU RN, Post -op Vital signs reviewed and stable and Patient moving all extremities X 4  Post vital signs: Reviewed and stable  Complications: No apparent anesthesia complications

## 2011-04-17 NOTE — Preoperative (Signed)
Beta Blockers   Reason not to administer Beta Blockers:Not Applicable, took Atenolol this am

## 2011-04-17 NOTE — Anesthesia Preprocedure Evaluation (Addendum)
Anesthesia Evaluation  Patient identified by MRN, date of birth, ID band Patient awake    Reviewed: Allergy & Precautions, H&P , NPO status , Patient's Chart, lab work & pertinent test results  History of Anesthesia Complications Negative for: history of anesthetic complications  Airway Mallampati: II  Neck ROM: full    Dental  (+) Dental Advidsory Given   Pulmonary sleep apnea and Continuous Positive Airway Pressure Ventilation ,  clear to auscultation  Pulmonary exam normal       Cardiovascular hypertension, Pt. on home beta blockers and Pt. on medications + CAD and + Past MI + dysrhythmias Atrial Fibrillation Regular Normal    Neuro/Psych Negative Neurological ROS  Negative Psych ROS   GI/Hepatic negative GI ROS, Neg liver ROS,   Endo/Other  Negative Endocrine ROSMorbid obesity  Renal/GU negative Renal ROS     Musculoskeletal   Abdominal   Peds  Hematology   Anesthesia Other Findings   Reproductive/Obstetrics                          Anesthesia Physical Anesthesia Plan  ASA: III  Anesthesia Plan: General   Post-op Pain Management:    Induction: Intravenous  Airway Management Planned: Oral ETT  Additional Equipment:   Intra-op Plan:   Post-operative Plan: Extubation in OR  Informed Consent: I have reviewed the patients History and Physical, chart, labs and discussed the procedure including the risks, benefits and alternatives for the proposed anesthesia with the patient or authorized representative who has indicated his/her understanding and acceptance.     Plan Discussed with: CRNA and Surgeon  Anesthesia Plan Comments:         Anesthesia Quick Evaluation

## 2011-04-17 NOTE — Op Note (Signed)
Procedure(s): TOTAL SHOULDER ARTHROPLASTY Procedure Note  Benjamin George male 71 y.o. 04/17/2011  Procedure(s) and Anesthesia Type:    * TOTAL SHOULDER ARTHROPLASTY - Choice  Surgeon(s) and Role:    * Mable Paris, MD - Primary   Indications:  71 y.o. male  With endstage left shoulder arthritis. Pain and dysfunction interfered with quality of life and nonoperative treatment with activity modification, NSAIDS and injections failed.     Surgeon: Mable Paris   Assistants: Skip Mayer PA-C  Anesthesia: General endotracheal anesthesia and IV regional     Procedure Detail  TOTAL SHOULDER ARTHROPLASTY  Findings: Depuy total shoulder with size 12 stem, 48x18 ecc head and 48 APG glenoid.  Estimated Blood Loss:  200 mL         Drains: 1 medium hemovac  Blood Given: none          Specimens: none        Complications:  * No complications entered in OR log *         Disposition: PACU - hemodynamically stable.         Condition: stable    Procedure:   The patient was identified in the preoperative holding area where I personally marked the operative extremity after verifying with the patient and consent. He  was taken to the operating room where He was transferred to the   operative table.  The patient received an interscalene block in   the holding area by the attending anesthesiologist.  General anesthesia was induced   in the operating room without complication.  The patient did receive IV  Ancef prior to the commencement of the procedure.  The patient was   placed in the beach-chair position with the back raised about 30   degrees.  The nonoperative extremity and head and neck were carefully   positioned and padded protecting against neurovascular compromise.  The   left upper extremity was then prepped and draped in the standard sterile   fashion.    The appropriate operative time-out was performed with   Anesthesia, the perioperative  staff, as well as myself and we all agreed   that the left side was the correct operative site.  An approximately   10 cm incision was made from the tip of the coracoid to the center point of the   humerus at the level of the axilla.  Dissection was carried down sharply   through subcutaneous tissues and cephalic vein was identified and taken   laterally with the deltoid.  The pectoralis major was taken medially.  The   upper 1 cm of the pectoralis major was released from its attachment on   the humerus.  The clavipectoral fascia was incised just lateral to the   conjoined tendon.  This incision was carried up to but not into the   coracoacromial ligament.  Digital palpation was used to prove   integrity of the axillary nerve which was protected throughout the   procedure.  Musculocutaneous nerve was not palpated in the operative   field.  Conjoined tendon was then retracted gently medially and the   deltoid laterally.  Anterior circumflex humeral vessels were clamped and   coagulated.  The soft tissues overlying the biceps was incised and this   incision was carried across the transverse humeral ligament to the base   of the coracoid.  The biceps was tenodesed to the soft tissue just above   pectoralis major and the remaining portion of  the biceps superiorly was   excised.  An osteotomy was performed at the lesser tuberosity and the   subscapularis was freed from the underlying capsule.  Capsule was then   released all the way down to the 6 o'clock position of the humeral head.   The humeral head was then delivered with simultaneous adduction,   extension and external rotation.  All humeral osteophytes were removed   and the anatomic neck of the humerus was marked and cut free hand at   approximately 25 degrees retroversion within about 3 mm of the cuff   reflection posteriorly.  The head size was estimated to be a 48 medium   offset.  At that point, the humeral head was retracted  posteriorly with   a Fukuda retractor and the anterior-inferior capsule was excised.   Remaining portion of the capsule was released at the base of the   coracoid.  The remaining biceps anchor and the entire anterior-inferior   labrum was excised.  The posterior labrum was also excised but the   posterior capsule was not released.  The guidepin was placed bicortically with 0 elevated guide.  The reamer was used to ream to concentric bone with punctate bleeding.  This gave an excellent concentric surface.  The center hole was then drilled for an anchor peg glenoid followed by the three peripheral holes and 1 of the holes   exited the glenoid wall posteriorly.  I then pulse irrigated these holes and dried   them with Surgicel and thrombin.  The three peripheral holes were then   pressurized cemented except the post hole and the anchor peg glenoid was placed and impacted   with an excellent fit.  The glenoid was a 48 component.  The proximal humerus was then again exposed taking care not to displace the glenoid.    The humerus was then sequentially reamed going from 6 to 12 by 2 mm incriments. The 12 mm reamer was found to have appropriate cortical contact.  A   box osteotome was then used and a 12-mm broach.  The broach handle was   removed and the trial head was placed.   Calcar reamer was used.The eccentric 48 x 18 head fit best.  With the trial implantation of the component, there was   approximately 50% posterior translation with immediate snap back to the   anatomic position.  With forward elevation, there was no tendency   towards posterior subluxation.   The trial was removed and the final implant was prepared on a back table.  The implant was then impacted and   achieved excellent anatomic reconstruction of the proximal humerus.  #2 Fiberwire was placed around the neck prior to impaction.  The joint   was then copiously irrigated with pulse lavage.  The subscapularis and   lesser tuberosity  osteotomy were then repaired using three #2 FiberWires   One #2 Ethibond was placed at the rotator interval just above   the lesser tuberosity.  After repair of the lesser tuberosity, a medium   Hemovac was placed out anterolaterally and again copious irrigation was   used.   Skin was closed with 2-0 Vicryl sutures in the deep dermal layer and 4-0 Monocryl in a subcuticular  running fashion.  Sterile dressings were then applied including Steri- Strips, 4x4s, ABDs and tape.  The patient was placed in a sling and allowed to awaken from general anesthesia and taken to the recovery room in stable  condition.  POSTOPERATIVE PLAN:  Early passive range of motion will be allowed with the goal of 403 degrees external rotation and a 140 degrees forward elevation.  No internal rotation at this time.  No active motion of the arm until the lesser tuberosity heels.  The patient will likely be kept in the hospital for 2 days and then discharged home.

## 2011-04-17 NOTE — Anesthesia Postprocedure Evaluation (Signed)
  Anesthesia Post-op Note  Patient: Benjamin George  Procedure(s) Performed:  TOTAL SHOULDER ARTHROPLASTY  Patient Location: PACU  Anesthesia Type: General  Level of Consciousness: awake  Airway and Oxygen Therapy: Patient Spontanous Breathing  Post-op Pain: mild  Post-op Assessment: Post-op Vital signs reviewed  Post-op Vital Signs: stable  Complications: No apparent anesthesia complications

## 2011-04-18 ENCOUNTER — Encounter (HOSPITAL_COMMUNITY): Payer: Self-pay | Admitting: Orthopedic Surgery

## 2011-04-18 DIAGNOSIS — M19012 Primary osteoarthritis, left shoulder: Secondary | ICD-10-CM

## 2011-04-18 LAB — HEMOGLOBIN AND HEMATOCRIT, BLOOD
HCT: 32.5 % — ABNORMAL LOW (ref 39.0–52.0)
Hemoglobin: 11.2 g/dL — ABNORMAL LOW (ref 13.0–17.0)

## 2011-04-18 LAB — BASIC METABOLIC PANEL
BUN: 13 mg/dL (ref 6–23)
CO2: 26 mEq/L (ref 19–32)
Calcium: 8.3 mg/dL — ABNORMAL LOW (ref 8.4–10.5)
Chloride: 103 mEq/L (ref 96–112)
Creatinine, Ser: 0.94 mg/dL (ref 0.50–1.35)
GFR calc Af Amer: >90 mL/min (ref >90)
GFR calc non Af Amer: 82 mL/min — ABNORMAL LOW (ref >90)
Glucose, Bld: 132 mg/dL — ABNORMAL HIGH (ref 70–99)
Potassium: 4 mEq/L (ref 3.5–5.1)
Sodium: 139 mEq/L (ref 135–145)

## 2011-04-18 MED ORDER — HYDROCODONE-ACETAMINOPHEN 5-325 MG PO TABS
1.0000 | ORAL_TABLET | ORAL | Status: DC | PRN
  Administered 2011-04-18 – 2011-04-19 (×4): 1 via ORAL
  Filled 2011-04-18 (×5): qty 1

## 2011-04-18 NOTE — Progress Notes (Signed)
PATIENT ID: KAMIL MCHAFFIE  MRN: [id number]  DOB/AGE:  [birth year] / 71 y.o.  1 Day Post-Op Procedure(s) (LRB): TOTAL SHOULDER ARTHROPLASTY (Left)  Subjective: Pain is mild.  No c/o chest pain or SOB.   No c/o overnight.  Used home CPAP.   Objective: Vital signs in last 24 hours: Temp:  [97.7 F (36.5 C)-98.4 F (36.9 C)] 98.4 F (36.9 C) (01/09 0524) Pulse Rate:  [52-94] 73  (01/09 0524) Resp:  [16-28] 20  (01/09 0524) BP: (112-134)/(59-78) 134/78 mmHg (01/09 0524) SpO2:  [93 %-100 %] 94 % (01/09 0524)  Intake/Output from previous day: 01/08 0701 - 01/09 0700 In: 2550 [I.V.:2000; IV Piggyback:500] Out: 1250 [Urine:1050; Blood:200] Intake/Output this shift:    No results found for this basename: HGB:5 in the last 72 hours No results found for this basename: WBC:2,RBC:2,HCT:2,PLT:2 in the last 72 hours No results found for this basename: NA:2,K:2,CL:2,CO2:2,BUN:2,CREATININE:2,GLUCOSE:2,CALCIUM:2 in the last 72 hours No results found for this basename: LABPT:2,INR:2 in the last 72 hours  Physical Exam: Neurovascular intact Sensation intact distally Intact pulses distally Incision: dressing C/D/I Sling intact, drain d/c'd  Assessment/Plan: 1 Day Post-Op Procedure(s) (LRB): TOTAL SHOULDER ARTHROPLASTY (Left)   Advance diet Up with therapy.  OT to teach PROM program. D/C IV fluids Plan for discharge tomorrow Non Weight Bearing (NWB)  Labs pending.   Mable Paris 04/18/2011, 7:38 AM

## 2011-04-18 NOTE — Progress Notes (Signed)
Pt admitted for L total shoulder repair. Pt has a dry dressing to L shoulder and +cms of L hand. Ice prn to L shoulder. Pt has sling to L shoulder. Pt is alert and oriented x 3. Neuro check is negative. Heart rate regular rate and rhythm. No s/sx cardiac or resp distress and no c/o such. Lungs CTA. Pt has a history of sleep apnea and CPAP at bedside and pt is using while asleep. Pt repts that he has a history of postop nausea and vomiting and did have some yesterday PM but denies upon assessment. Will start pt on clear liquid diet this AM. Pt abdomen is soft flat nontender and nondistended but BS faint to nonexistent x 4. Pt denies passing gas. Pt voids in urinal without difficulty. Pt repts LBM 1/7. Pt is receiving stool softners per order. MD in this AM to d/c hemovac.

## 2011-04-18 NOTE — Progress Notes (Signed)
Occupational Therapy Evaluation Patient Details Name: Benjamin George MRN: [id number] DOB: 05-10-1940 Today's Date: 04/18/2011  Problem List:  Patient Active Problem List  Diagnoses  . HYPERLIPIDEMIA  . GOUT  . OBSTRUCTIVE SLEEP APNEA  . HYPERTENSION  . HEART ATTACK  . CARDIAC ARREST  . ALLERGIC RHINITIS  . INSOMNIA  . CAD (coronary artery disease)  . Preoperative evaluation to rule out surgical contraindication    Past Medical History:  Past Medical History  Diagnosis Date  . HLD (hyperlipidemia)   . Gout   . Sleep apnea     NPSG 10/26/07 - AHI 16.4  . Heart attack   . HTN (hypertension)   . Cardiac arrest   . Gout    Past Surgical History:  Past Surgical History  Procedure Date  . Angioplasty   . Carotid stent   . Nasal septopalsty   . Hand surgery     left   . Total shoulder arthroplasty 04/17/2011    Procedure: TOTAL SHOULDER ARTHROPLASTY;  Surgeon: Mable Paris, MD;  Location: [facility] OR;  Service: Orthopedics;  Laterality: Left;    OT Assessment/Plan/Recommendation OT Assessment Clinical Impression Statement: Pt s/p L total shoulder replacement with Dr. Ave Filter. Pt will benefit from skilled acute care OT to complete ADL education s/p shoulder relacement according to precautions ( NWB and no AROM) in addition to educating pt/wife on HEP of forward flexion to 140 degress with assist of RUE and ER to 40 with use of dowel. Pt to follow up with MD after D/C. Further OT will be established at that time. OT Recommendation/Assessment: Patient will need skilled OT in the acute care venue OT Problem List: Decreased strength;Decreased range of motion;Decreased knowledge of precautions;Impaired UE functional use;Pain Barriers to Discharge: None OT Therapy Diagnosis : Generalized weakness OT Plan OT Frequency: Min 2X/week OT Treatment/Interventions: Self-care/ADL training;Therapeutic exercise;Therapeutic activities;Patient/family education OT Recommendation Follow  Up Recommendations: Outpatient OT (as needed. TBA) Equipment Recommended: None recommended by OT Individuals Consulted Consulted and Agree with Results and Recommendations: Patient;Family member/caregiver Family Member Consulted: wife OT Goals Acute Rehab OT Goals OT Goal Formulation: With patient Time For Goal Achievement: 7 days ADL Goals Pt Will Perform Upper Body Bathing: with set-up;Standing at sink ADL Goal: Upper Body Bathing - Progress: Not met Pt Will Perform Upper Body Dressing: with min assist;Sit to stand from chair;with cueing (comment type and amount) ADL Goal: Upper Body Dressing - Progress: Not met Arm Goals Pt Will Perform AROM: Independently;to maintain range of motion;Left upper extremity;2 sets (L elbow/hand A/AAROM when out of sling to pain tolerance.) Arm Goal: AROM - Progress: Not met Pt Will Tolerate PROM: Other (comment);Left upper extremity (per Dr Ave Filter protocal with assist of RUE. ER 40 with dowe) Arm Goal: PROM - Progress: Progressing toward goal Miscellaneous OT Goals Miscellaneous OT Goal #1: pt'wife will donn/doff sling independently. OT Goal: Miscellaneous Goal #1 - Progress: Progressing toward goals  OT Evaluation Precautions/Restrictions  Precautions Precautions: Shoulder (Chandler protocal) Type of Shoulder Precautions: PROM only. shoulder forward flexion to 140 supine using RUE to guide L arm. 40 degress ER with use of dowel Precaution Comments: NWB. Sling at all times with exception of ADL Required Braces or Orthoses: Yes (sling) Restrictions Weight Bearing Restrictions: Yes LUE Weight Bearing: Non weight bearing Prior Functioning Home Living Lives With: Spouse Receives Help From: Family Type of Home: House Home Layout: Two level;Full bath on main level Home Access: Stairs to enter [employer] Shower/Tub: Health visitor: Standard Home Adaptive Equipment:  None Prior Function Level of Independence: Independent with basic  ADLs;Independent with homemaking with ambulation Able to Take Stairs?: Yes Driving: Yes Vocation: Retired ADL ADL Eating/Feeding: Set up Grooming: Minimal assistance Where Assessed - Grooming: Sitting, chair Upper Body Bathing: Minimal assistance;Performed Where Assessed - Upper Body Bathing: Sitting, chair Lower Body Bathing: Minimal assistance Where Assessed - Lower Body Bathing: Sit to stand from chair Upper Body Dressing: Moderate assistance;Simulated Where Assessed - Upper Body Dressing: Sitting, chair Lower Body Dressing: Minimal assistance;Simulated Where Assessed - Lower Body Dressing: Sit to stand from chair Toilet Transfer: Supervision/safety Toilet Transfer Method: Proofreader: Comfort height toilet Toileting - Clothing Manipulation: Performed;Independent Where Assessed - Toileting Clothing Manipulation: Standing Toileting - Hygiene: Independent Where Assessed - Toileting Hygiene: Standing Tub/Shower Transfer: Not assessed Equipment Used:  (sling) Ambulation Related to ADLs: indep for ADL ADL Comments: Educated pt/family on compensatory technique for bathing and dressing after shoulder replacement surgery. Given written information.  Vision/Perception  Vision - History Baseline Vision: Wears glasses all the time Cognition Cognition Arousal/Alertness: Awake/alert Overall Cognitive Status: Appears within functional limits for tasks assessed Orientation Level: Oriented X4 Sensation/Coordination Sensation Light Touch: Appears Intact Stereognosis: Appears Intact Coordination Gross Motor Movements are Fluid and Coordinated: No Fine Motor Movements are Fluid and Coordinated: No Coordination and Movement Description:  (due to surgery/precautions) Extremity Assessment RUE Assessment RUE Assessment: Within Functional Limits LUE Assessment LUE Assessment: Exceptions to [facility] (restrictions due to surgery) Mobility  Bed Mobility Bed Mobility:  Yes (supervision. Cues for NWB LUE.) Transfers Transfers: Yes (indep) Exercises Shoulder Exercises Shoulder Flexion: PROM;Supine;Limitations;Left (goal to 140 with assist of RUE.) Shoulder External Rotation: PROM;Left;Supine (with dowel) Elbow Flexion: AAROM Elbow Extension: AAROM Wrist Flexion: AROM Wrist Extension: AROM Digit Composite Flexion: AROM Composite Extension: AROM End of Session OT - End of Session Equipment Utilized During Treatment: Gait belt Activity Tolerance: Patient tolerated treatment well;Patient limited by pain Patient left: in bed;with call bell in reach;with family/visitor present Nurse Communication: Mobility status for transfers;Other (comment) (arm precautions) General Behavior During Session: [facility] for tasks performed Cognition: [facility] for tasks performed   Lemont Sitzmann,HILLARY 04/18/2011, 2:43 PM  [facility], OTR/L  [phone] 04/18/2011

## 2011-04-18 NOTE — Progress Notes (Signed)
UR COMPLETED.  [phone] 

## 2011-04-19 MED ORDER — BISACODYL 10 MG RE SUPP
10.0000 mg | Freq: Every day | RECTAL | Status: DC | PRN
  Filled 2011-04-19: qty 1

## 2011-04-19 MED ORDER — HYDROCODONE-ACETAMINOPHEN 5-325 MG PO TABS: 1.0000 | ORAL_TABLET | ORAL | Status: AC | PRN

## 2011-04-19 MED ORDER — DSS 100 MG PO CAPS: 100.0000 mg | ORAL_CAPSULE | Freq: Two times a day (BID) | ORAL | Status: AC

## 2011-04-19 MED ORDER — METHOCARBAMOL 500 MG PO TABS: 500.0000 mg | ORAL_TABLET | Freq: Three times a day (TID) | ORAL | Status: AC | PRN

## 2011-04-19 NOTE — Progress Notes (Signed)
After pt requested suppository prior to d/c, RN obtained order for suppository and went into room to administer. Pt declined suppository. Pt states, "I will take care of that at home". Proceeding with d/c prior order.

## 2011-04-19 NOTE — Progress Notes (Signed)
Pt is s/p  L total shoulder repair. +cms. Pt keeps LUE elevated to prevent/minimize edema. Dr. Ave Filter in this AM to remove dressing. Incision of L shoulder is open to air. Incision well approximated with steri strips. No s/sx infection noted. Ice prn to L shoulder. Pt ambulates with steady gait with sling. Pt repts, "I am constipated." Pt BS+x4. Pt repts passing gas. Pt denies nausea or vomiting and tolerates diet fair to good. BS+x4. Abdomen soft, flat nontender and nondistended. Pt repts LBM 1/7. Pt is receiving stool softners per order. Will call MD and obtain order for laxative and administer. Pt is voiding and pain is controlled with current pain regimen. No s/sx resp distress and no c/o such. Vital signs stable. Lungs CTA. Heart rate regular.

## 2011-04-19 NOTE — Discharge Summary (Signed)
Patient ID: Benjamin George MRN: [id number] DOB/AGE: [birth year] 71 y.o.  Admit date: 04/17/2011 Discharge date: 04/19/2011  Admission Diagnoses:  Active Problems:  Arthritis of shoulder region, left   Discharge Diagnoses:  Same  Past Medical History  Diagnosis Date  . HLD (hyperlipidemia)   . Gout   . Sleep apnea     NPSG 10/26/07 - AHI 16.4  . Heart attack   . HTN (hypertension)   . Cardiac arrest   . Gout     Surgeries: Procedure(s): TOTAL SHOULDER ARTHROPLASTY on 04/17/2011   Consultants:  none  Discharged Condition: Improved  Hospital Course: Benjamin George is an 71 y.o. male who was admitted 04/17/2011 for operative treatment of L shoulder osteoarthritis. Patient has severe unremitting pain that affects sleep, daily activities, and work/hobbies. After pre-op clearance the patient was taken to the operating room on 04/17/2011 and underwent  Procedure(s): TOTAL SHOULDER ARTHROPLASTY.    Patient was given perioperative antibiotics: Anti-infectives     Start     Dose/Rate Route Frequency Ordered Stop   04/17/11 2200   ceFAZolin (ANCEF) IVPB 2 g/50 mL premix        2 g 100 mL/hr over 30 Minutes Intravenous Every 6 hours 04/17/11 1802 04/18/11 1136   04/16/11 1500   ceFAZolin (ANCEF) IVPB 2 g/50 mL premix        2 g 100 mL/hr over 30 Minutes Intravenous 60 min pre-op 04/16/11 1452 04/17/11 1445           Patient was given sequential compression devices, early ambulation,  to prevent DVT.  Patient benefited maximally from hospital stay and there were no complications.    Maintained CPAP nightly for OSA.  Worked with OT and demonstrated proficiency with exercises.  Pain well controlled at d/c. Tolerated norco, without significant nausea.  Recent vital signs: Patient Vitals for the past 24 hrs:  BP Temp Temp src Pulse Resp SpO2  04/19/11 0659 129/67 mmHg 97.9 F (36.6 C) - 65  24  90 %  04/18/11 2332 123/72 mmHg 98.8 F (37.1 C) Oral 74  24  95 %  04/18/11  2218 - - - - - 93 %  04/18/11 1400 117/72 mmHg 99.1 F (37.3 C) - 77  18  95 %     Recent laboratory studies:  Basename 04/18/11 0705  WBC --  HGB 11.2*  HCT 32.5*  PLT --  NA 139  K 4.0  CL 103  CO2 26  BUN 13  CREATININE 0.94  GLUCOSE 132*  INR --  CALCIUM 8.3*     Discharge Medications:  Current Discharge Medication List    START taking these medications   Details  docusate sodium 100 MG CAPS Take 100 mg by mouth 2 (two) times daily. Qty: 60 capsule, Refills: 0    HYDROcodone-acetaminophen (NORCO) 5-325 MG per tablet Take 1-2 tablets by mouth every 4 (four) hours as needed. Qty: 60 tablet, Refills: 0    methocarbamol (ROBAXIN) 500 MG tablet Take 1 tablet (500 mg total) by mouth 3 (three) times daily as needed. Qty: 30 tablet, Refills: 0      CONTINUE these medications which have NOT CHANGED   Details  allopurinol (ZYLOPRIM) 100 MG tablet Take 100 mg by mouth daily.      Ascorbic Acid (VITAMIN C) 1000 MG tablet Take 500 mg by mouth daily.      !! aspirin 325 MG tablet Take 325 mg by mouth daily.      !!  aspirin 81 MG tablet Take 160 mg by mouth daily.      atenolol (TENORMIN) 50 MG tablet Take 50 mg by mouth daily.     atorvastatin (LIPITOR) 10 MG tablet Take 1 tablet (10 mg total) by mouth daily. Qty: 30 tablet, Refills: 5    losartan-hydrochlorothiazide (HYZAAR) 100-12.5 MG per tablet Take 1 tablet by mouth daily.      Multiple Vitamin (MULTIVITAMIN) tablet Take 1 tablet by mouth daily.      isosorbide mononitrate (IMDUR) 30 MG 24 hr tablet Take 1 tablet (30 mg total) by mouth daily. Qty: 30 tablet, Refills: 6     !! - Potential duplicate medications found. Please discuss with provider.    STOP taking these medications     meloxicam (MOBIC) 7.5 MG tablet         Diagnostic Studies: Dg Chest 2 View  04/12/2011  *RADIOLOGY REPORT*  Clinical Data: Preoperative respiratory exam; left shoulder arthroplasty; hypertension and previous smoker  CHEST - 2  VIEW  Comparison: June 26, 2007  Findings: The cardiac silhouette, mediastinum, pulmonary vasculature are within normal limits.  Both lungs are clear. There is no acute bony abnormality.  IMPRESSION: There is no evidence of acute cardiac or pulmonary process.  Original Report Authenticated By: Brandon Melnick, M.D.   Dg Shoulder Left Port  04/17/2011  *RADIOLOGY REPORT*  Clinical Data: Postop  PORTABLE LEFT SHOULDER - 2+ VIEW  Comparison: Plain film 02/26/2010  Findings: Left shoulder hemiarthroplasty.  Glenohumeral joint is intact.  Surgical drain in place.  IMPRESSION: Left shoulder hemiarthroplasty without complication.  Original Report Authenticated By: Genevive Bi, M.D.    Disposition:   Discharge Orders    Future Orders Please Complete By Expires   Diet - low sodium heart healthy      Call MD / Call 911      Comments:   If you experience chest pain or shortness of breath, CALL 911 and be transported to the hospital emergency room.  If you develope a fever above 101 F, pus (white drainage) or increased drainage or redness at the wound, or calf pain, call your surgeon's office.   Constipation Prevention      Comments:   Drink plenty of fluids.  Prune juice may be helpful.  You may use a stool softener, such as Colace (over the counter) 100 mg twice a day.  Use MiraLax (over the counter) for constipation as needed.   Increase activity slowly as tolerated      Weight Bearing as taught in Physical Therapy      Comments:   Use a walker or crutches as instructed.   Driving restrictions      Comments:   No driving for 4 weeks      Follow-up Information    Follow up with Mable Paris, MD in 2 weeks.   Contact information:   [facility] [address] [place] [address] [phone]           Signed: Mable Paris 04/19/2011, 7:33 AM

## 2011-04-19 NOTE — Progress Notes (Signed)
PATIENT ID: Benjamin George  MRN: [id number]  DOB/AGE:  12/22/1940 / 71 y.o.  2 Days Post-Op Procedure(s) (LRB): TOTAL SHOULDER ARTHROPLASTY (Left)  Subjective: Pain is mild.  No c/o chest pain or SOB.   Slept well overnight.  Saw OT yesterday.   Objective: Vital signs in last 24 hours: Temp:  [97.9 F (36.6 C)-99.1 F (37.3 C)] 97.9 F (36.6 C) (01/10 0659) Pulse Rate:  [65-77] 65  (01/10 0659) Resp:  [18-24] 24  (01/10 0659) BP: (117-129)/(67-72) 129/67 mmHg (01/10 0659) SpO2:  [90 %-95 %] 90 % (01/10 0659)  Intake/Output from previous day: 01/09 0701 - 01/10 0700 In: 2430 [P.O.:1680; I.V.:750] Out: 601 [Urine:601] Intake/Output this shift:     Basename 04/18/11 0705  HGB 11.2*    Basename 04/18/11 0705  WBC --  RBC --  HCT 32.5*  PLT --    Basename 04/18/11 0705  NA 139  K 4.0  CL 103  CO2 26  BUN 13  CREATININE 0.94  GLUCOSE 132*  CALCIUM 8.3*   No results found for this basename: LABPT:2,INR:2 in the last 72 hours  Physical Exam: Neurovascular intact Sensation intact distally Intact pulses distally Incision: no drainage  Assessment/Plan: 2 Days Post-Op Procedure(s) (LRB): TOTAL SHOULDER ARTHROPLASTY (Left)   Doing well. D/c home today after OT for reinforcement of exercises F/u 10-14 days.    Mable Paris 04/19/2011, 7:28 AM

## 2011-04-19 NOTE — Progress Notes (Addendum)
Occupational Therapy Treatment Patient Details Name: Benjamin George MRN: [id number] DOB: February 13, 1941 Today's Date: 04/19/2011 9:23-9:51  1sc 1te OT Assessment/Plan OT Assessment/Plan Comments on Treatment Session: Pt doing well.  Able to return demonstrate safe performance of self PROM exercises.  Will have assist for selfcare tasks from wife.  Feel he is ok to D/C home with wife and proceed with outpatient therapy as Dr. Ave Filter sees fit. OT Plan: Discharge plan remains appropriate Follow Up Recommendations: Outpatient OT Equipment Recommended: None recommended by OT OT Goals Arm Goals Arm Goal: PROM - Progress: Partly met Miscellaneous OT Goals OT Goal: Miscellaneous Goal #1 - Progress: Progressing toward goals  OT Treatment Precautions/Restrictions  Precautions Precautions: Shoulder Type of Shoulder Precautions: PROM shoulder flexion to 140 degrees, external rotation with dowel rod to 40 degrees. Required Braces or Orthoses: Yes Other Brace/Splint: Sling for left arm. Restrictions Weight Bearing Restrictions: Yes LUE Weight Bearing: Non weight bearing   ADL ADL Upper Body Dressing: Maximal assistance Upper Body Dressing Details (indicate cue type and reason): Max assist for sling. Lower Body Dressing: Minimal assistance Lower Body Dressing Details (indicate cue type and reason): Pt required assist with donning underpants over left foo and pull over hips. Where Assessed - Lower Body Dressing: Sit to stand from bed ADL Comments: Pt/family educated on self PROM exercises using dowel rod.  Wife present and will be able to assist with selfcare post d/c.  Educated on proper donning, doffing, and positioning of sling. Mobility  Bed Mobility Bed Mobility: Yes Rolling Right: 5: Supervision Right Sidelying to Sit: 5: Supervision Transfers Transfers: Yes Sit to Stand: 7: Independent;With upper extremity assist Exercises Shoulder Exercises Shoulder Flexion: Left;10  reps;Supine;Self ROM (Utilized dowel rod.  ) Shoulder External Rotation: Self ROM;10 reps;Other (comment) Elbow Flexion: 10 reps  End of Session OT - End of Session Equipment Utilized During Treatment: Other (comment) (Sling) Activity Tolerance: Patient tolerated treatment well Patient left: in bed General Behavior During Session: [facility] for tasks performed Cognition: [facility] for tasks performed  Yulian Gosney OTR/L 04/19/2011, 1:28 PM Pager number [phone]

## 2011-04-19 NOTE — Progress Notes (Signed)
Occupational Therapy Treatment Patient Details Name: Benjamin George MRN: [id number] DOB: 16-Sep-1940 Today's Date: 04/19/2011 12:43-12:58  1sc OT Assessment/Plan OT Assessment/Plan Comments on Treatment Session: Pt and family had forgotten correct technique for donning button up shirt.  Therapist re-instructed pt and spouse on correct procedure for donning button up shirt.  Also re-emphasized bathing techniques.  Pt ready fo D/C. OT Plan: Discharge plan remains appropriate OT Frequency: Min 2X/week Follow Up Recommendations: Outpatient OT Equipment Recommended: None recommended by OT OT Goals ADL Goals ADL Goal: Upper Body Dressing - Progress: Progressing toward goals Arm Goals Arm Goal: PROM - Progress: Partly met Miscellaneous OT Goals OT Goal: Miscellaneous Goal #1 - Progress: Progressing toward goals  OT Treatment Precautions/Restrictions  Precautions Precautions: Shoulder Type of Shoulder Precautions: PROM shoulder flexion to 140 degrees, external rotation with dowel rod to 40 degrees. Precaution Comments: Sling at all times except for bathing, dressing, and exercises. Required Braces or Orthoses: Yes Other Brace/Splint: Sling for left arm. Restrictions Weight Bearing Restrictions: Yes LUE Weight Bearing: Non weight bearing   ADL ADL Upper Body Dressing: Moderate assistance Upper Body Dressing Details (indicate cue type and reason): For button up shirt, max assist for sling.  Pt's wife able to help donn sling independently. Where Assessed - Upper Body Dressing: Sitting, bed Lower Body Dressing: Minimal assistance Lower Body Dressing Details (indicate cue type and reason): Pt required assist with donning underpants over left foo and pull over hips. Where Assessed - Lower Body Dressing: Sit to stand from bed ADL Comments: Educated pt and wife on correct donning of button up shirt and reviewed handout with regards to psitioning for bathing, dressing, and exercises for the  non involved joints.   Mobility  Bed Mobility Bed Mobility: No Rolling Right: 5: Supervision Right Sidelying to Sit: 5: Supervision Transfers Transfers: No Sit to Stand: 7: Independent;With upper extremity assist  End of Session OT - End of Session Equipment Utilized During Treatment: Other (comment) (Sling) Activity Tolerance: Patient tolerated treatment well Patient left: in bed General Behavior During Session: [facility] for tasks performed Cognition: [facility] for tasks performed  Waniya Hoglund OTR/L 04/19/2011, 1:45 PM Pager number [phone]

## 2011-05-22 ENCOUNTER — Encounter: Payer: Self-pay | Admitting: Cardiology

## 2011-06-20 ENCOUNTER — Other Ambulatory Visit: Payer: Self-pay | Admitting: Cardiology

## 2011-09-06 ENCOUNTER — Encounter: Payer: Self-pay | Admitting: Cardiology

## 2011-09-25 ENCOUNTER — Other Ambulatory Visit: Payer: Self-pay | Admitting: *Deleted

## 2011-09-25 MED ORDER — ISOSORBIDE MONONITRATE ER 30 MG PO TB24: 30.0000 mg | ORAL_TABLET | Freq: Every day | ORAL | Status: DC

## 2011-10-25 ENCOUNTER — Other Ambulatory Visit: Payer: Self-pay | Admitting: Internal Medicine

## 2011-10-25 ENCOUNTER — Ambulatory Visit
Admission: RE | Admit: 2011-10-25 | Discharge: 2011-10-25 | Disposition: A | Discharge status: Home / Self Care | Payer: Medicare Other | Source: Ambulatory Visit | Attending: Internal Medicine | Admitting: Internal Medicine

## 2011-10-25 ENCOUNTER — Encounter: Payer: Self-pay | Admitting: Cardiology

## 2011-10-25 DIAGNOSIS — R109 Unspecified abdominal pain: Secondary | ICD-10-CM

## 2011-11-19 ENCOUNTER — Other Ambulatory Visit: Payer: Self-pay | Admitting: Cardiology

## 2011-12-09 ENCOUNTER — Other Ambulatory Visit: Payer: Self-pay | Admitting: Cardiology

## 2011-12-12 ENCOUNTER — Ambulatory Visit (INDEPENDENT_AMBULATORY_CARE_PROVIDER_SITE_OTHER): Payer: Medicare Other | Admitting: Cardiology

## 2011-12-12 ENCOUNTER — Encounter: Payer: Self-pay | Admitting: Cardiology

## 2011-12-12 VITALS — BP 130/70 | HR 58 | Ht 71.0 in | Wt 270.0 lb

## 2011-12-12 DIAGNOSIS — E785 Hyperlipidemia, unspecified: Secondary | ICD-10-CM

## 2011-12-12 DIAGNOSIS — I2510 Atherosclerotic heart disease of native coronary artery without angina pectoris: Secondary | ICD-10-CM

## 2011-12-12 NOTE — Assessment & Plan Note (Signed)
Stable with no ischemic symptoms.  Continue ASA, beta blocker, Imdur, statin, ARB but can decrease aspirin to 81 mg daily.  EF was normal on most recent echo.

## 2011-12-12 NOTE — Assessment & Plan Note (Signed)
We discussed diet and exercise for weight loss. 

## 2011-12-12 NOTE — Progress Notes (Signed)
Patient ID: Benjamin George, male   DOB: [birth year], 71 y.o.   MRN: [id number] PCP: Dr. Selena Batten  71 yo patient with history of CAD presents for cardiology followup.  He had RCA angioplasty in 1990 and PCI to CFX in 1997. Left heart cath in 2009 showed 60% ostial RCA stenosis and patent CFX stent.  Echo in 8/12 showed normal EF with mild MR and AI.  He continues to use his CPAP for OSA.  He had shoulder replacement in 1/13 with no complications.  Main complaint now is right hip pain with ambulation.   He also has some muscle pain/aching in the legs.  Not very active since surgery, but no chest pain or exertional dyspnea.  He played golf last week for the first time since his operation.  ECG: NSR, 1st degree AV block  Labs (2/13): K 4.7, creatinine 1.09, HDL 42, LDL 78  PMH: 1. OSA: CPAP 2. HTN 3. Obesity 4. PVCs 5. CAD: Inferior MI in 1990 treated with tPA then RCA angioplasty.  PCI to CFX in 1997.  LHC (3/09) with 60% ostial RCA, patent CFX stent, EF 45%.  Echo (8/12): EF 55-65%, grade I diastolic dysfunction, mild MR, mild AI.  6. Gout 7. Low back pain 8. Left shoulder replacement   SH: Married, retired from [employer], stopped smoking in 1989, occasional ETOH.   FH: Premature CAD   Current Outpatient Prescriptions  Medication Sig Dispense Refill  . allopurinol (ZYLOPRIM) 100 MG tablet Take 100 mg by mouth daily.        . Ascorbic Acid (VITAMIN C) 1000 MG tablet Take 500 mg by mouth daily.        Marland Kitchen atenolol (TENORMIN) 50 MG tablet Take 50 mg by mouth daily.       . isosorbide mononitrate (IMDUR) 30 MG 24 hr tablet Take 1 tablet (30 mg total) by mouth daily.  90 tablet  1  . losartan-hydrochlorothiazide (HYZAAR) 100-12.5 MG per tablet TAKE 1 TABLET BY MOUTH EVERY DAY  90 tablet  1  . Multiple Vitamin (MULTIVITAMIN) tablet Take 1 tablet by mouth daily.        Marland Kitchen DISCONTD: aspirin 325 MG tablet Take 325 mg by mouth daily.        Marland Kitchen aspirin EC 81 MG tablet Take 1 tablet (81 mg total) by mouth  daily.      Marland Kitchen atorvastatin (LIPITOR) 10 MG tablet TAKE 1 TABLET BY MOUTH EVERY DAY  90 tablet  3  . Coenzyme Q10 200 MG TABS Take by mouth.    0  . DISCONTD: aspirin 81 MG tablet Take 160 mg by mouth daily.          BP 130/70  Pulse 58  Ht 5\' 11"  (1.803 m)  Wt 270 lb (122.471 kg)  BMI 37.66 kg/m2 General: NAD, obese Neck: Thick neck, no JVD, no thyromegaly or thyroid nodule.  Lungs: Clear to auscultation bilaterally with normal respiratory effort. CV: Nondisplaced PMI.  Heart regular S1/S2, no S3/S4, no murmur.  No peripheral edema.  No carotid bruit.  2+ PT pulse on left and trace PT pulse on right.  Abdomen: Soft, nontender, no hepatosplenomegaly, no distention.  Neurologic: Alert and oriented x 3.  Psych: Normal affect. Extremities: No clubbing or cyanosis.

## 2011-12-12 NOTE — Assessment & Plan Note (Signed)
LDL was near goal of < 70 in 5/13.  He has some muscle aching that may be attributable to the statin.  I will have him try coenzyme Q10 200 mg daily to see if this helps the symptoms.  If no effect and the muscle aching continues, could switch to Crestor.

## 2011-12-12 NOTE — Patient Instructions (Addendum)
Decrease aspirin to 81mg  daily. Take this with food.  Take coenzyme Q10 200mg  daily.  Your physician wants you to follow-up in: 6 months with Dr Shirlee Latch. (March 2014). You will receive a reminder letter in the mail two months in advance. If you don't receive a letter, please call our office to schedule the follow-up appointment.

## 2012-01-01 ENCOUNTER — Other Ambulatory Visit: Payer: Self-pay | Admitting: Cardiology

## 2012-01-17 ENCOUNTER — Other Ambulatory Visit: Payer: Self-pay | Admitting: Cardiology

## 2012-03-12 ENCOUNTER — Encounter: Payer: Self-pay | Admitting: Cardiology

## 2012-03-24 ENCOUNTER — Telehealth: Payer: Self-pay | Admitting: Cardiology

## 2012-03-24 NOTE — Telephone Encounter (Signed)
New problem:   Returning  Call back to nurse.

## 2012-03-24 NOTE — Telephone Encounter (Signed)
Spoke with pt. Pt states LDL results he received from Dr Selena Batten are different than LDL on lab report from Dr Selena Batten done 03/12/12 scanned in to Epic. Pt will check this with his paperwork and home and call back if questions.

## 2012-05-23 ENCOUNTER — Other Ambulatory Visit: Payer: Self-pay | Admitting: Cardiology

## 2012-07-01 ENCOUNTER — Encounter: Payer: Self-pay | Admitting: Cardiology

## 2012-07-01 ENCOUNTER — Ambulatory Visit (INDEPENDENT_AMBULATORY_CARE_PROVIDER_SITE_OTHER): Payer: Medicare Other | Admitting: Cardiology

## 2012-07-01 VITALS — BP 120/66 | HR 63 | Ht 71.0 in | Wt 269.0 lb

## 2012-07-01 DIAGNOSIS — I10 Essential (primary) hypertension: Secondary | ICD-10-CM

## 2012-07-01 DIAGNOSIS — E785 Hyperlipidemia, unspecified: Secondary | ICD-10-CM

## 2012-07-01 DIAGNOSIS — I2584 Coronary atherosclerosis due to calcified coronary lesion: Secondary | ICD-10-CM

## 2012-07-01 DIAGNOSIS — I2510 Atherosclerotic heart disease of native coronary artery without angina pectoris: Secondary | ICD-10-CM

## 2012-07-01 MED ORDER — ATORVASTATIN CALCIUM 20 MG PO TABS: 20.0000 mg | ORAL_TABLET | Freq: Every day | ORAL | Status: DC

## 2012-07-01 NOTE — Patient Instructions (Addendum)
Increase atorvastatin to 20mg  daily. You can take 2 of your 10mg  tablets daily at the same time and use your current supply.  Your physician recommends that you return for a FASTING lipid profile /liver profile in 2 months. This is scheduled for Tuesday May 27,2014. The lab opens at 7:30 Am. Do not eat or drink after midnight.   Your physician wants you to follow-up in: 6 months with Dr Shirlee Latch. (September 2014).  You will receive a reminder letter in the mail two months in advance. If you don't receive a letter, please call our office to schedule the follow-up appointment.

## 2012-07-01 NOTE — Progress Notes (Signed)
Patient ID: ALOYSIOUS George, male   DOB: 09-Sep-1940, 72 y.o.   MRN: [id number] PCP: Dr. Selena George  72 yo patient with history of CAD presents for cardiology followup.  He had RCA angioplasty in 1990 and PCI to CFX in 1997. Left heart cath in 2009 showed 60% ostial RCA stenosis and patent CFX stent.  Echo in 8/12 showed normal EF with mild MR and AI.  He continues to use his CPAP for OSA.    He is doing well in general.  Main complaint is chronic back pain.  No chest pain or exertional dyspnea.  BP is controlled and weight is stable.   ECG: NSR, 1st degree AV block  Labs (2/13): K 4.7, creatinine 1.09, HDL 42, LDL 78 Labs (3/14): K 5.1, creatinine 1.17, HDL 49, LDL 92  PMH: 1. OSA: CPAP 2. HTN 3. Obesity 4. PVCs 5. CAD: Inferior MI in 1990 treated with tPA then RCA angioplasty.  PCI to CFX in 1997.  LHC (3/09) with 60% ostial RCA, patent CFX stent, EF 45%.  Echo (8/12): EF 55-65%, grade I diastolic dysfunction, mild MR, mild AI.  6. Gout 7. Low back pain 8. Left shoulder replacement  SH: Married, retired from [employer], stopped smoking in 1989, occasional ETOH.   FH: Premature CAD   Current Outpatient Prescriptions  Medication Sig Dispense Refill  . allopurinol (ZYLOPRIM) 100 MG tablet Take 100 mg by mouth daily.        . Ascorbic Acid (VITAMIN C) 1000 MG tablet Take 500 mg by mouth daily.        Marland Kitchen aspirin EC 81 MG tablet Take 1 tablet (81 mg total) by mouth daily.      Marland Kitchen atenolol (TENORMIN) 50 MG tablet       . isosorbide mononitrate (IMDUR) 30 MG 24 hr tablet TAKE 1 TABLET BY MOUTH DAILY.  30 tablet  6  . losartan-hydrochlorothiazide (HYZAAR) 100-12.5 MG per tablet TAKE 1 TABLET BY MOUTH EVERY DAY  90 tablet  0  . Multiple Vitamin (MULTIVITAMIN) tablet Take 1 tablet by mouth daily.        Marland Kitchen atorvastatin (LIPITOR) 20 MG tablet Take 1 tablet (20 mg total) by mouth daily.  30 tablet  3  . Coenzyme Q10 200 MG TABS Take by mouth.    0   No current facility-administered medications for this  visit.    BP 120/66  Pulse 63  Ht 5\' 11"  (1.803 m)  Wt 269 lb (122.018 kg)  BMI 37.53 kg/m2  SpO2 96% General: NAD, obese Neck: Thick neck, no JVD, no thyromegaly or thyroid nodule.  Lungs: Clear to auscultation bilaterally with normal respiratory effort. CV: Nondisplaced PMI.  Heart regular S1/S2, no S3/S4, no murmur.  No peripheral edema.  No carotid bruit.  2+ PT pulse on left and trace PT pulse on right.  Abdomen: Soft, nontender, no hepatosplenomegaly, no distention.  Neurologic: Alert and oriented x 3.  Psych: Normal affect. Extremities: No clubbing or cyanosis.   Assessment/Plan: 1. CAD: No ischemic symptoms.  Continue ASA 81, statin, ARB, and beta blocker.  2. Hyperlipidemia: I would like to see LDL lower.  Increase atorvastatin to 20 mg daily with lipids/LFTs in 2 months. 3. HTN: BP is under good control.  Benjamin George 07/01/2012

## 2012-08-15 ENCOUNTER — Telehealth: Payer: Self-pay | Admitting: Cardiology

## 2012-08-15 NOTE — Telephone Encounter (Signed)
LMOVM will forward to [facility]. Mylo Red RN

## 2012-08-20 ENCOUNTER — Other Ambulatory Visit: Payer: Self-pay | Admitting: Cardiology

## 2012-08-25 NOTE — Telephone Encounter (Signed)
LMTCB

## 2012-08-27 NOTE — Telephone Encounter (Signed)
Spoke with patient and he said everything is cool.

## 2012-09-02 ENCOUNTER — Other Ambulatory Visit (INDEPENDENT_AMBULATORY_CARE_PROVIDER_SITE_OTHER): Payer: Medicare Other

## 2012-09-02 DIAGNOSIS — I2584 Coronary atherosclerosis due to calcified coronary lesion: Secondary | ICD-10-CM

## 2012-09-02 DIAGNOSIS — E785 Hyperlipidemia, unspecified: Secondary | ICD-10-CM

## 2012-09-02 LAB — HEPATIC FUNCTION PANEL
ALT: 20 U/L (ref 0–53)
AST: 19 U/L (ref 0–37)
Albumin: 3.8 g/dL (ref 3.5–5.2)
Alkaline Phosphatase: 45 U/L (ref 39–117)
Bilirubin, Direct: 0 mg/dL (ref 0.0–0.3)
Total Bilirubin: 0.5 mg/dL (ref 0.3–1.2)
Total Protein: 6.6 g/dL (ref 6.0–8.3)

## 2012-09-02 LAB — LIPID PANEL
Cholesterol: 150 mg/dL (ref 0–200)
HDL: 49.7 mg/dL (ref >39.00)
LDL Cholesterol: 72 mg/dL (ref 0–99)
Total CHOL/HDL Ratio: 3
Triglycerides: 141 mg/dL (ref 0.0–149.0)
VLDL: 28.2 mg/dL (ref 0.0–40.0)

## 2012-10-27 ENCOUNTER — Other Ambulatory Visit: Payer: Self-pay | Admitting: Internal Medicine

## 2012-10-27 DIAGNOSIS — M549 Dorsalgia, unspecified: Secondary | ICD-10-CM

## 2012-10-30 ENCOUNTER — Ambulatory Visit
Admission: RE | Admit: 2012-10-30 | Discharge: 2012-10-30 | Disposition: A | Discharge status: Home / Self Care | Payer: Medicare Other | Source: Ambulatory Visit | Attending: Internal Medicine | Admitting: Internal Medicine

## 2012-10-30 DIAGNOSIS — M549 Dorsalgia, unspecified: Secondary | ICD-10-CM

## 2012-11-17 ENCOUNTER — Other Ambulatory Visit: Payer: Self-pay | Admitting: Cardiology

## 2012-11-26 ENCOUNTER — Other Ambulatory Visit: Payer: Self-pay | Admitting: Cardiology

## 2012-12-11 ENCOUNTER — Other Ambulatory Visit: Payer: Self-pay | Admitting: Cardiology

## 2012-12-12 NOTE — Telephone Encounter (Signed)
Please call the patient and ask him if he has been taking atenolol.

## 2013-01-01 ENCOUNTER — Other Ambulatory Visit: Payer: Self-pay | Admitting: Cardiology

## 2013-01-26 ENCOUNTER — Other Ambulatory Visit: Payer: Self-pay | Admitting: Cardiology

## 2013-02-16 ENCOUNTER — Other Ambulatory Visit: Payer: Self-pay | Admitting: Cardiology

## 2013-02-25 ENCOUNTER — Other Ambulatory Visit: Payer: Self-pay | Admitting: Cardiology

## 2013-03-02 ENCOUNTER — Ambulatory Visit (INDEPENDENT_AMBULATORY_CARE_PROVIDER_SITE_OTHER): Payer: Medicare Other | Admitting: Cardiology

## 2013-03-02 ENCOUNTER — Encounter: Payer: Self-pay | Admitting: Cardiology

## 2013-03-02 ENCOUNTER — Encounter (INDEPENDENT_AMBULATORY_CARE_PROVIDER_SITE_OTHER): Payer: Self-pay

## 2013-03-02 VITALS — BP 109/63 | HR 62 | Ht 71.0 in | Wt 263.8 lb

## 2013-03-02 DIAGNOSIS — E785 Hyperlipidemia, unspecified: Secondary | ICD-10-CM

## 2013-03-02 DIAGNOSIS — Z01818 Encounter for other preprocedural examination: Secondary | ICD-10-CM

## 2013-03-02 DIAGNOSIS — I2510 Atherosclerotic heart disease of native coronary artery without angina pectoris: Secondary | ICD-10-CM

## 2013-03-02 DIAGNOSIS — R002 Palpitations: Secondary | ICD-10-CM | POA: Insufficient documentation

## 2013-03-02 LAB — BASIC METABOLIC PANEL
BUN: 24 mg/dL — ABNORMAL HIGH (ref 6–23)
CO2: 25 mEq/L (ref 19–32)
Calcium: 9.1 mg/dL (ref 8.4–10.5)
Chloride: 103 mEq/L (ref 96–112)
Creatinine, Ser: 1.4 mg/dL (ref 0.4–1.5)
GFR: 54.08 mL/min — ABNORMAL LOW (ref >60.00)
Glucose, Bld: 103 mg/dL — ABNORMAL HIGH (ref 70–99)
Potassium: 4.5 mEq/L (ref 3.5–5.1)
Sodium: 138 mEq/L (ref 135–145)

## 2013-03-02 LAB — LIPID PANEL
Cholesterol: 152 mg/dL (ref 0–200)
HDL: 47.5 mg/dL (ref >39.00)
LDL Cholesterol: 79 mg/dL (ref 0–99)
Total CHOL/HDL Ratio: 3
Triglycerides: 128 mg/dL (ref 0.0–149.0)
VLDL: 25.6 mg/dL (ref 0.0–40.0)

## 2013-03-02 NOTE — Progress Notes (Signed)
Patient ID: Benjamin George, male   DOB: December 15, 1940, 72 y.o.   MRN: [id number] PCP: Dr. Selena Batten  72 yo patient with history of CAD presents for cardiology followup.  He had RCA angioplasty in 1990 and PCI to CFX in 1997. Left heart cath in 2009 showed 60% ostial RCA stenosis and patent CFX stent.  Echo in 8/12 showed normal EF with mild MR and AI.  He continues to use his CPAP for OSA.    A few weeks ago, he had about 2 days where he felt his heart beating irregularly.  He did not seek medical care at the time.  He took an extra atenolol at night for several days and the sensation resolved.  Currently, he has mild occasional palpitations, but no long runs of irregular heart rhythm.  No chest pain.  No exertional dyspnea.  Main complaint currently is low back pain from spinal stenosis. He is going to have an operation in January.   Weight is down 6 lbs compared to prior appointment.    ECG: NSR, normal  Labs (2/13): K 4.7, creatinine 1.09, HDL 42, LDL 78 Labs (3/14): K 5.1, creatinine 1.17, HDL 49, LDL 92 Labs (5/14): LDL 72, HDL 50  PMH: 1. OSA: CPAP 2. HTN 3. Obesity 4. PVCs 5. CAD: Inferior MI in 1990 treated with tPA then RCA angioplasty.  PCI to CFX in 1997.  LHC (3/09) with 60% ostial RCA, patent CFX stent, EF 45%.  Echo (8/12): EF 55-65%, grade I diastolic dysfunction, mild MR, mild AI.  6. Gout 7. Low back pain: spinal stenosis.  8. Left shoulder replacement  SH: Married, retired from [employer], stopped smoking in 1989, occasional ETOH.   FH: Premature CAD  ROS: All systems reviewed and negative except as per HPI.    Current Outpatient Prescriptions  Medication Sig Dispense Refill  . acetaminophen (TYLENOL) 500 MG tablet Take 1,000 mg by mouth every 6 (six) hours as needed.       Marland Kitchen allopurinol (ZYLOPRIM) 100 MG tablet Take 100 mg by mouth daily.        . Ascorbic Acid (VITAMIN C) 1000 MG tablet Take 500 mg by mouth daily.        Marland Kitchen aspirin EC 81 MG tablet Take 1 tablet (81 mg  total) by mouth daily.      Marland Kitchen atenolol (TENORMIN) 50 MG tablet Take 1 tablet (50 mg total) by mouth daily.  90 tablet  1  . atorvastatin (LIPITOR) 20 MG tablet TAKE 1 TABLET BY MOUTH ONCE DAILY  30 tablet  0  . CINNAMON PO Take 1,000 mg by mouth daily.      . Coenzyme Q10 200 MG TABS Take by mouth.    0  . Docusate Calcium (STOOL SOFTENER PO) Take 500 mg by mouth 2 (two) times daily.      . isosorbide mononitrate (IMDUR) 30 MG 24 hr tablet TAKE 1 TABLET BY MOUTH DAILY.  30 tablet  6  . isosorbide mononitrate (IMDUR) 30 MG 24 hr tablet TAKE 1 TABLET BY MOUTH DAILY.  30 tablet  6  . losartan-hydrochlorothiazide (HYZAAR) 100-12.5 MG per tablet TAKE 1 TABLET BY MOUTH ONCE DAILY  90 tablet  0  . Multiple Vitamin (MULTIVITAMIN) tablet Take 1 tablet by mouth daily.        . vitamin B-12 (CYANOCOBALAMIN) 100 MCG tablet Take 100 mcg by mouth 2 (two) times daily.       No current facility-administered medications for this visit.  BP 109/63  Pulse 62  Ht 5\' 11"  (1.803 m)  Wt 119.659 kg (263 lb 12.8 oz)  BMI 36.81 kg/m2 General: NAD, obese Neck: Thick neck, no JVD, no thyromegaly or thyroid nodule.  Lungs: Clear to auscultation bilaterally with normal respiratory effort. CV: Nondisplaced PMI.  Heart regular S1/S2, no S3/S4, no murmur.  No peripheral edema.  No carotid bruit.  2+ PT pulse on left and trace PT pulse on right.  Abdomen: Soft, nontender, no hepatosplenomegaly, no distention.  Neurologic: Alert and oriented x 3.  Psych: Normal affect. Extremities: No clubbing or cyanosis.   Assessment/Plan: 1. CAD: No ischemic symptoms.  Continue ASA 81, statin, ARB, and beta blocker.  2. Hyperlipidemia:Good lipids in 5/14.  3. HTN: BP is under good control. 4. Palpitations: Long run of irregular heart rhythm a couple of weeks ago.  He is in NSR today.  I will get a 48 hour holter monitor to screen for paroxysmal atrial fibrillation.   5. Preoperative evaluation: Patient needs back surgery.  No  chest pain or exertional dyspnea.  He does not need further testing prior to surgery.  He should continue beta blocker perioperatively.    [name] 03/02/2013

## 2013-03-02 NOTE — Patient Instructions (Signed)
Your physician recommends that you return for a  lipid profile /BMET today.  Your physician has recommended that you wear a holter monitor. Holter monitors are medical devices that record the heart's electrical activity. Doctors most often use these monitors to diagnose arrhythmias. Arrhythmias are problems with the speed or rhythm of the heartbeat. The monitor is a small, portable device. You can wear one while you do your normal daily activities. This is usually used to diagnose what is causing palpitations/syncope (passing out). 48 Hour  Your physician wants you to follow-up in: 6 months with Dr Shirlee Latch. (May 2015). You will receive a reminder letter in the mail two months in advance. If you don't receive a letter, please call our office to schedule the follow-up appointment.

## 2013-03-10 ENCOUNTER — Other Ambulatory Visit: Payer: Self-pay | Admitting: Orthopedic Surgery

## 2013-03-14 ENCOUNTER — Other Ambulatory Visit: Payer: Self-pay | Admitting: Cardiology

## 2013-03-17 ENCOUNTER — Encounter (INDEPENDENT_AMBULATORY_CARE_PROVIDER_SITE_OTHER): Payer: Medicare Other

## 2013-03-17 ENCOUNTER — Encounter: Payer: Self-pay | Admitting: *Deleted

## 2013-03-17 DIAGNOSIS — E785 Hyperlipidemia, unspecified: Secondary | ICD-10-CM

## 2013-03-17 DIAGNOSIS — R002 Palpitations: Secondary | ICD-10-CM

## 2013-03-17 DIAGNOSIS — I2510 Atherosclerotic heart disease of native coronary artery without angina pectoris: Secondary | ICD-10-CM

## 2013-03-17 NOTE — Progress Notes (Signed)
Patient ID: Benjamin George, male   DOB: February 12, 1941, 72 y.o.   MRN: [id number] EVO 48 hour holter monitor applied to patient.

## 2013-03-23 ENCOUNTER — Telehealth: Payer: Self-pay | Admitting: Cardiology

## 2013-03-23 NOTE — Telephone Encounter (Signed)
Dr Shirlee Latch reviewed monitor done 03/17/13--rare PVCs and PACs, no change in therapy. Pt notified.

## 2013-03-23 NOTE — Telephone Encounter (Signed)
New Problem:  Pt states he would like to know the results from his heart monitor. Pt is requesting a call back.

## 2013-03-23 NOTE — Telephone Encounter (Signed)
I have not been given the results of the monitor. I will forward to Shelly/Katy

## 2013-03-24 ENCOUNTER — Telehealth: Payer: Self-pay | Admitting: Cardiology

## 2013-03-24 NOTE — Telephone Encounter (Signed)
Received request from Nurse fax box, documents faxed for surgical clearance. To: [facility] Fax number: [phone] Attention: 03/24/13/KM

## 2013-03-30 ENCOUNTER — Other Ambulatory Visit: Payer: Self-pay | Admitting: Cardiology

## 2013-04-06 ENCOUNTER — Other Ambulatory Visit: Payer: Self-pay | Admitting: Orthopedic Surgery

## 2013-04-07 ENCOUNTER — Encounter (HOSPITAL_COMMUNITY): Admission: RE | Admit: 2013-04-07 | Payer: Medicare Other | Source: Ambulatory Visit

## 2013-04-16 ENCOUNTER — Encounter (HOSPITAL_COMMUNITY): Admission: RE | Payer: Self-pay | Source: Ambulatory Visit

## 2013-04-16 ENCOUNTER — Inpatient Hospital Stay (HOSPITAL_COMMUNITY): Admission: RE | Admit: 2013-04-16 | Payer: Medicare Other | Source: Ambulatory Visit | Admitting: Orthopedic Surgery

## 2013-04-16 SURGERY — LUMBAR LAMINECTOMY WITH  X-STOP 1 LEVEL
Anesthesia: General

## 2013-04-22 ENCOUNTER — Encounter (HOSPITAL_COMMUNITY): Payer: Self-pay | Admitting: Pharmacy Technician

## 2013-04-24 NOTE — Pre-Procedure Instructions (Addendum)
Decoda Van Cherne  04/24/2013   Your procedure is scheduled on:  Wednesday, January28.  Report to [facility], Main Entrance / Entrance "A" at 6:30AM.  Call this number if you have problems the morning of surgery: [phone]   Remember:   Do not eat food or drink liquids after midnight Tuesday, January 27.   Take these medicines the morning of surgery with A SIP OF WATER:  Atenolol (Tenormin),  isosorbide mononitrate (IMDUR),  allopurinol (ZYLOPRIM).   Take if needed: acetaminophen (TYLENOL), meclizine (ANTIVERT).           STOP all herbel meds, nsaids (aleve,naproxen,advil,ibuprofen) 5 days prior to surgery including vitamins ,aspirin, cinnamon, coq10    Do not wear jewelry, make-up or nail polish.  Do not wear lotions, powders, or perfumes. You may wear deodorant.   Men may shave face and neck.  Do not bring valuables to the hospital.  [facility] is not responsible for any belongings or valuables.               Contacts, dentures or bridgework may not be worn into surgery.  Leave suitcase in the car. After surgery it may be brought to your room.  For patients admitted to the hospital, discharge time is determined by your treatment team.               Patients discharged the day of surgery will not be allowed to drive home.  Name and phone number of your driver: -   Special Instructions: Shower using CHG 2 nights before surgery and the night before surgery.  If you shower the day of surgery use CHG.  Use special wash - you have one bottle of CHG for all showers.  You should use approximately 1/3 of the bottle for each shower.   Please read over the following fact sheets that you were given: Pain Booklet, Coughing and Deep Breathing, Blood Transfusion Information and Surgical Site Infection Prevention

## 2013-04-27 ENCOUNTER — Encounter (HOSPITAL_COMMUNITY)
Admission: RE | Admit: 2013-04-27 | Discharge: 2013-04-27 | Disposition: A | Discharge status: Home / Self Care | Payer: Medicare Other | Source: Ambulatory Visit | Attending: Orthopedic Surgery | Admitting: Orthopedic Surgery

## 2013-04-27 ENCOUNTER — Encounter (HOSPITAL_COMMUNITY): Payer: Self-pay

## 2013-04-27 DIAGNOSIS — Z01810 Encounter for preprocedural cardiovascular examination: Secondary | ICD-10-CM | POA: Insufficient documentation

## 2013-04-27 DIAGNOSIS — Z01812 Encounter for preprocedural laboratory examination: Secondary | ICD-10-CM | POA: Insufficient documentation

## 2013-04-27 DIAGNOSIS — Z01818 Encounter for other preprocedural examination: Secondary | ICD-10-CM | POA: Insufficient documentation

## 2013-04-27 HISTORY — DX: Unspecified osteoarthritis, unspecified site: M19.90

## 2013-04-27 LAB — URINALYSIS, ROUTINE W REFLEX MICROSCOPIC
Bilirubin Urine: NEGATIVE
Glucose, UA: NEGATIVE mg/dL
Hgb urine dipstick: NEGATIVE
Ketones, ur: NEGATIVE mg/dL
Leukocytes, UA: NEGATIVE
Nitrite: NEGATIVE
Protein, ur: NEGATIVE mg/dL
Specific Gravity, Urine: 1.017 (ref 1.005–1.030)
Urobilinogen, UA: 0.2 mg/dL (ref 0.0–1.0)
pH: 6 (ref 5.0–8.0)

## 2013-04-27 LAB — CBC WITH DIFFERENTIAL/PLATELET
Basophils Absolute: 0 10*3/uL (ref 0.0–0.1)
Basophils Relative: 0 % (ref 0–1)
Eosinophils Absolute: 0.2 10*3/uL (ref 0.0–0.7)
Eosinophils Relative: 3 % (ref 0–5)
HCT: 39.3 % (ref 39.0–52.0)
Hemoglobin: 13.7 g/dL (ref 13.0–17.0)
Lymphocytes Relative: 32 % (ref 12–46)
Lymphs Abs: 2.6 10*3/uL (ref 0.7–4.0)
MCH: 32.4 pg (ref 26.0–34.0)
MCHC: 34.9 g/dL (ref 30.0–36.0)
MCV: 92.9 fL (ref 78.0–100.0)
Monocytes Absolute: 0.7 10*3/uL (ref 0.1–1.0)
Monocytes Relative: 9 % (ref 3–12)
Neutro Abs: 4.4 10*3/uL (ref 1.7–7.7)
Neutrophils Relative %: 56 % (ref 43–77)
Platelets: 222 10*3/uL (ref 150–400)
RBC: 4.23 MIL/uL (ref 4.22–5.81)
RDW: 12.5 % (ref 11.5–15.5)
WBC: 7.9 10*3/uL (ref 4.0–10.5)

## 2013-04-27 LAB — COMPREHENSIVE METABOLIC PANEL
ALT: 20 U/L (ref 0–53)
AST: 18 U/L (ref 0–37)
Albumin: 3.8 g/dL (ref 3.5–5.2)
Alkaline Phosphatase: 60 U/L (ref 39–117)
BUN: 13 mg/dL (ref 6–23)
CO2: 26 mEq/L (ref 19–32)
Calcium: 9.3 mg/dL (ref 8.4–10.5)
Chloride: 100 mEq/L (ref 96–112)
Creatinine, Ser: 1.1 mg/dL (ref 0.50–1.35)
GFR calc Af Amer: 75 mL/min — ABNORMAL LOW (ref >90)
GFR calc non Af Amer: 65 mL/min — ABNORMAL LOW (ref >90)
Glucose, Bld: 109 mg/dL — ABNORMAL HIGH (ref 70–99)
Potassium: 4.4 mEq/L (ref 3.7–5.3)
Sodium: 139 mEq/L (ref 137–147)
Total Bilirubin: 0.4 mg/dL (ref 0.3–1.2)
Total Protein: 7 g/dL (ref 6.0–8.3)

## 2013-04-27 LAB — TYPE AND SCREEN
ABO/RH(D): AB POS
Antibody Screen: NEGATIVE

## 2013-04-27 LAB — APTT: aPTT: 27 s (ref 24–37)

## 2013-04-27 LAB — PROTIME-INR
INR: 0.97 (ref 0.00–1.49)
Prothrombin Time: 12.7 seconds (ref 11.6–15.2)

## 2013-04-27 LAB — SURGICAL PCR SCREEN
MRSA, PCR: NEGATIVE
Staphylococcus aureus: NEGATIVE

## 2013-05-05 MED ORDER — CEFAZOLIN SODIUM 10 G IJ SOLR
3.0000 g | INTRAMUSCULAR | Status: AC
  Administered 2013-05-06: 3 g via INTRAVENOUS
  Filled 2013-05-05: qty 3000

## 2013-05-06 ENCOUNTER — Encounter (HOSPITAL_COMMUNITY)
Admission: RE | Disposition: A | Discharge status: Home / Self Care | Payer: Self-pay | Source: Ambulatory Visit | Attending: Orthopedic Surgery

## 2013-05-06 ENCOUNTER — Encounter (HOSPITAL_COMMUNITY): Payer: Self-pay | Admitting: Critical Care Medicine

## 2013-05-06 ENCOUNTER — Observation Stay (HOSPITAL_COMMUNITY)
Admission: RE | Admit: 2013-05-06 | Discharge: 2013-05-07 | Disposition: A | Discharge status: Home / Self Care | Payer: Medicare Other | Source: Ambulatory Visit | Attending: Orthopedic Surgery | Admitting: Orthopedic Surgery

## 2013-05-06 ENCOUNTER — Ambulatory Visit (HOSPITAL_COMMUNITY): Payer: Medicare Other

## 2013-05-06 ENCOUNTER — Ambulatory Visit (HOSPITAL_COMMUNITY): Payer: Medicare Other | Admitting: Critical Care Medicine

## 2013-05-06 ENCOUNTER — Encounter (HOSPITAL_COMMUNITY): Payer: Medicare Other | Admitting: Critical Care Medicine

## 2013-05-06 DIAGNOSIS — M4800 Spinal stenosis, site unspecified: Secondary | ICD-10-CM | POA: Diagnosis present

## 2013-05-06 DIAGNOSIS — G4730 Sleep apnea, unspecified: Secondary | ICD-10-CM | POA: Insufficient documentation

## 2013-05-06 DIAGNOSIS — Z7982 Long term (current) use of aspirin: Secondary | ICD-10-CM | POA: Insufficient documentation

## 2013-05-06 DIAGNOSIS — I252 Old myocardial infarction: Secondary | ICD-10-CM | POA: Insufficient documentation

## 2013-05-06 DIAGNOSIS — Z6837 Body mass index (BMI) 37.0-37.9, adult: Secondary | ICD-10-CM | POA: Insufficient documentation

## 2013-05-06 DIAGNOSIS — E785 Hyperlipidemia, unspecified: Secondary | ICD-10-CM | POA: Insufficient documentation

## 2013-05-06 DIAGNOSIS — Z87891 Personal history of nicotine dependence: Secondary | ICD-10-CM | POA: Insufficient documentation

## 2013-05-06 DIAGNOSIS — I10 Essential (primary) hypertension: Secondary | ICD-10-CM | POA: Insufficient documentation

## 2013-05-06 DIAGNOSIS — M129 Arthropathy, unspecified: Secondary | ICD-10-CM | POA: Insufficient documentation

## 2013-05-06 DIAGNOSIS — M109 Gout, unspecified: Secondary | ICD-10-CM | POA: Insufficient documentation

## 2013-05-06 DIAGNOSIS — M48062 Spinal stenosis, lumbar region with neurogenic claudication: Principal | ICD-10-CM | POA: Insufficient documentation

## 2013-05-06 DIAGNOSIS — Z8674 Personal history of sudden cardiac arrest: Secondary | ICD-10-CM | POA: Insufficient documentation

## 2013-05-06 HISTORY — DX: Other specified postprocedural states: Z98.890

## 2013-05-06 HISTORY — PX: LUMBAR LAMINECTOMY/DECOMPRESSION MICRODISCECTOMY: SHX5026

## 2013-05-06 HISTORY — DX: Other specified postprocedural states: R11.2

## 2013-05-06 HISTORY — DX: Other complications of anesthesia, initial encounter: T88.59XA

## 2013-05-06 HISTORY — DX: Adverse effect of unspecified anesthetic, initial encounter: T41.45XA

## 2013-05-06 LAB — GLUCOSE, CAPILLARY: Glucose-Capillary: 111 mg/dL — ABNORMAL HIGH (ref 70–99)

## 2013-05-06 SURGERY — LUMBAR LAMINECTOMY/DECOMPRESSION MICRODISCECTOMY 1 LEVEL
Anesthesia: General | Site: Spine Lumbar

## 2013-05-06 MED ORDER — BUPIVACAINE-EPINEPHRINE (PF) 0.25% -1:200000 IJ SOLN
INTRAMUSCULAR | Status: AC
  Filled 2013-05-06: qty 30

## 2013-05-06 MED ORDER — FENTANYL CITRATE 0.05 MG/ML IJ SOLN
INTRAMUSCULAR | Status: AC
  Filled 2013-05-06: qty 5

## 2013-05-06 MED ORDER — DIAZEPAM 5 MG PO TABS: 5.0000 mg | ORAL_TABLET | Freq: Four times a day (QID) | ORAL | Status: DC | PRN

## 2013-05-06 MED ORDER — GLYCOPYRROLATE 0.2 MG/ML IJ SOLN
INTRAMUSCULAR | Status: DC | PRN
  Administered 2013-05-06: 0.6 mg via INTRAVENOUS

## 2013-05-06 MED ORDER — ACETAMINOPHEN 650 MG RE SUPP: 650.0000 mg | RECTAL | Status: DC | PRN

## 2013-05-06 MED ORDER — POVIDONE-IODINE 7.5 % EX SOLN
Freq: Once | CUTANEOUS | Status: DC
  Filled 2013-05-06: qty 118

## 2013-05-06 MED ORDER — SODIUM CHLORIDE 0.9 % IJ SOLN: 3.0000 mL | INTRAMUSCULAR | Status: DC | PRN

## 2013-05-06 MED ORDER — THROMBIN 20000 UNITS EX SOLR
CUTANEOUS | Status: DC | PRN
  Administered 2013-05-06: 09:00:00

## 2013-05-06 MED ORDER — NEOSTIGMINE METHYLSULFATE 1 MG/ML IJ SOLN
INTRAMUSCULAR | Status: DC | PRN
  Administered 2013-05-06: 4 mg via INTRAVENOUS

## 2013-05-06 MED ORDER — METOCLOPRAMIDE HCL 5 MG/ML IJ SOLN
INTRAMUSCULAR | Status: AC
  Filled 2013-05-06: qty 2

## 2013-05-06 MED ORDER — LOSARTAN POTASSIUM-HCTZ 100-12.5 MG PO TABS: 1.0000 | ORAL_TABLET | Freq: Every morning | ORAL | Status: DC

## 2013-05-06 MED ORDER — METHYLPREDNISOLONE ACETATE 40 MG/ML IJ SUSP
INTRAMUSCULAR | Status: AC
  Filled 2013-05-06: qty 1

## 2013-05-06 MED ORDER — ACETAMINOPHEN 160 MG/5ML PO SOLN
325.0000 mg | ORAL | Status: DC | PRN
  Filled 2013-05-06: qty 20.3

## 2013-05-06 MED ORDER — ATENOLOL 50 MG PO TABS
50.0000 mg | ORAL_TABLET | Freq: Every morning | ORAL | Status: DC
  Administered 2013-05-07: 50 mg via ORAL
  Filled 2013-05-06: qty 1

## 2013-05-06 MED ORDER — FLEET ENEMA 7-19 GM/118ML RE ENEM
1.0000 | ENEMA | Freq: Once | RECTAL | Status: AC | PRN
Start: 2013-05-06 — End: 2013-05-06

## 2013-05-06 MED ORDER — PHENOL 1.4 % MT LIQD: 1.0000 | OROMUCOSAL | Status: DC | PRN

## 2013-05-06 MED ORDER — VITAMIN B-12 100 MCG PO TABS
200.0000 ug | ORAL_TABLET | Freq: Every morning | ORAL | Status: DC
  Administered 2013-05-07: 200 ug via ORAL
  Filled 2013-05-06: qty 2

## 2013-05-06 MED ORDER — SODIUM CHLORIDE 0.9 % IJ SOLN
3.0000 mL | Freq: Two times a day (BID) | INTRAMUSCULAR | Status: DC
  Administered 2013-05-06: 3 mL via INTRAVENOUS

## 2013-05-06 MED ORDER — HYDROMORPHONE HCL PF 1 MG/ML IJ SOLN
INTRAMUSCULAR | Status: AC
  Filled 2013-05-06: qty 1

## 2013-05-06 MED ORDER — MENTHOL 3 MG MT LOZG: 1.0000 | LOZENGE | OROMUCOSAL | Status: DC | PRN

## 2013-05-06 MED ORDER — ALLOPURINOL 100 MG PO TABS
100.0000 mg | ORAL_TABLET | Freq: Every morning | ORAL | Status: DC
  Administered 2013-05-07: 100 mg via ORAL
  Filled 2013-05-06: qty 1

## 2013-05-06 MED ORDER — ONDANSETRON HCL 4 MG PO TABS: 4.0000 mg | ORAL_TABLET | Freq: Three times a day (TID) | ORAL | Status: DC | PRN

## 2013-05-06 MED ORDER — HYDROMORPHONE HCL PF 1 MG/ML IJ SOLN
0.5000 mg | INTRAMUSCULAR | Status: DC | PRN
Start: 2013-05-06 — End: 2013-05-07

## 2013-05-06 MED ORDER — NEOSTIGMINE METHYLSULFATE 1 MG/ML IJ SOLN
INTRAMUSCULAR | Status: AC
  Filled 2013-05-06: qty 20

## 2013-05-06 MED ORDER — ARTIFICIAL TEARS OP OINT
TOPICAL_OINTMENT | OPHTHALMIC | Status: AC
  Filled 2013-05-06: qty 3.5

## 2013-05-06 MED ORDER — PROPOFOL 10 MG/ML IV BOLUS
INTRAVENOUS | Status: AC
  Filled 2013-05-06: qty 20

## 2013-05-06 MED ORDER — METOCLOPRAMIDE HCL 5 MG/ML IJ SOLN
INTRAMUSCULAR | Status: DC | PRN
  Administered 2013-05-06: 10 mg via INTRAVENOUS

## 2013-05-06 MED ORDER — ROCURONIUM BROMIDE 50 MG/5ML IV SOLN
INTRAVENOUS | Status: AC
  Filled 2013-05-06: qty 1

## 2013-05-06 MED ORDER — ACETAMINOPHEN 325 MG PO TABS: 325.0000 mg | ORAL_TABLET | ORAL | Status: DC | PRN

## 2013-05-06 MED ORDER — EPHEDRINE SULFATE 50 MG/ML IJ SOLN
INTRAMUSCULAR | Status: AC
  Filled 2013-05-06: qty 1

## 2013-05-06 MED ORDER — LOSARTAN POTASSIUM 50 MG PO TABS
100.0000 mg | ORAL_TABLET | Freq: Every day | ORAL | Status: DC
  Administered 2013-05-07: 100 mg via ORAL
  Filled 2013-05-06: qty 2

## 2013-05-06 MED ORDER — ARTIFICIAL TEARS OP OINT
TOPICAL_OINTMENT | OPHTHALMIC | Status: DC | PRN
  Administered 2013-05-06: 1 via OPHTHALMIC

## 2013-05-06 MED ORDER — MIDAZOLAM HCL 2 MG/2ML IJ SOLN
INTRAMUSCULAR | Status: AC
  Filled 2013-05-06: qty 2

## 2013-05-06 MED ORDER — GLYCOPYRROLATE 0.2 MG/ML IJ SOLN
INTRAMUSCULAR | Status: AC
  Filled 2013-05-06: qty 3

## 2013-05-06 MED ORDER — METHYLENE BLUE 1 % INJ SOLN
INTRAMUSCULAR | Status: AC
  Filled 2013-05-06: qty 10

## 2013-05-06 MED ORDER — FENTANYL CITRATE 0.05 MG/ML IJ SOLN
INTRAMUSCULAR | Status: DC | PRN
  Administered 2013-05-06: 100 ug via INTRAVENOUS
  Administered 2013-05-06: 50 ug via INTRAVENOUS
  Administered 2013-05-06: 150 ug via INTRAVENOUS
  Administered 2013-05-06 (×4): 25 ug via INTRAVENOUS
  Administered 2013-05-06: 100 ug via INTRAVENOUS

## 2013-05-06 MED ORDER — BUPIVACAINE-EPINEPHRINE 0.25% -1:200000 IJ SOLN
INTRAMUSCULAR | Status: DC | PRN
  Administered 2013-05-06: 26 mL

## 2013-05-06 MED ORDER — OXYCODONE HCL 5 MG/5ML PO SOLN: 5.0000 mg | Freq: Once | ORAL | Status: DC | PRN

## 2013-05-06 MED ORDER — ONDANSETRON HCL 4 MG/2ML IJ SOLN: 4.0000 mg | Freq: Once | INTRAMUSCULAR | Status: DC | PRN

## 2013-05-06 MED ORDER — PHENYLEPHRINE 40 MCG/ML (10ML) SYRINGE FOR IV PUSH (FOR BLOOD PRESSURE SUPPORT)
PREFILLED_SYRINGE | INTRAVENOUS | Status: AC
  Filled 2013-05-06: qty 10

## 2013-05-06 MED ORDER — HYDROCHLOROTHIAZIDE 12.5 MG PO CAPS
12.5000 mg | ORAL_CAPSULE | Freq: Every day | ORAL | Status: DC
  Administered 2013-05-07: 12.5 mg via ORAL
  Filled 2013-05-06: qty 1

## 2013-05-06 MED ORDER — DEXAMETHASONE SODIUM PHOSPHATE 4 MG/ML IJ SOLN
INTRAMUSCULAR | Status: AC
  Filled 2013-05-06: qty 1

## 2013-05-06 MED ORDER — INDIGOTINDISULFONATE SODIUM 8 MG/ML IJ SOLN
INTRAMUSCULAR | Status: DC | PRN
  Administered 2013-05-06: .5 mL

## 2013-05-06 MED ORDER — SODIUM CHLORIDE 0.9 % IV SOLN
250.0000 mL | INTRAVENOUS | Status: DC
Start: 2013-05-06 — End: 2013-05-07

## 2013-05-06 MED ORDER — PHENYLEPHRINE HCL 10 MG/ML IJ SOLN
INTRAMUSCULAR | Status: DC | PRN
  Administered 2013-05-06: 80 ug via INTRAVENOUS

## 2013-05-06 MED ORDER — PHENYLEPHRINE HCL 10 MG/ML IJ SOLN
10.0000 mg | INTRAVENOUS | Status: DC | PRN
  Administered 2013-05-06: 25 ug/min via INTRAVENOUS

## 2013-05-06 MED ORDER — LACTATED RINGERS IV SOLN
INTRAVENOUS | Status: DC | PRN
Start: 2013-05-06 — End: 2013-05-06
  Administered 2013-05-06 (×2): via INTRAVENOUS

## 2013-05-06 MED ORDER — ONDANSETRON HCL 4 MG/2ML IJ SOLN
INTRAMUSCULAR | Status: AC
  Filled 2013-05-06: qty 2

## 2013-05-06 MED ORDER — ONDANSETRON HCL 4 MG/2ML IJ SOLN
INTRAMUSCULAR | Status: DC | PRN
  Administered 2013-05-06 (×2): 4 mg via INTRAVENOUS

## 2013-05-06 MED ORDER — CEFAZOLIN SODIUM 1-5 GM-% IV SOLN
1.0000 g | Freq: Three times a day (TID) | INTRAVENOUS | Status: AC
  Administered 2013-05-06 – 2013-05-07 (×2): 1 g via INTRAVENOUS
  Filled 2013-05-06 (×2): qty 50

## 2013-05-06 MED ORDER — ZOLPIDEM TARTRATE 5 MG PO TABS: 5.0000 mg | ORAL_TABLET | Freq: Every evening | ORAL | Status: DC | PRN

## 2013-05-06 MED ORDER — DOCUSATE SODIUM 100 MG PO CAPS
100.0000 mg | ORAL_CAPSULE | Freq: Two times a day (BID) | ORAL | Status: DC
  Administered 2013-05-06 – 2013-05-07 (×2): 100 mg via ORAL
  Filled 2013-05-06 (×2): qty 1

## 2013-05-06 MED ORDER — BISACODYL 5 MG PO TBEC
5.0000 mg | DELAYED_RELEASE_TABLET | Freq: Every day | ORAL | Status: DC | PRN
Start: 2013-05-06 — End: 2013-05-07

## 2013-05-06 MED ORDER — SENNOSIDES-DOCUSATE SODIUM 8.6-50 MG PO TABS: 1.0000 | ORAL_TABLET | Freq: Every evening | ORAL | Status: DC | PRN

## 2013-05-06 MED ORDER — OXYCODONE-ACETAMINOPHEN 5-325 MG PO TABS
1.0000 | ORAL_TABLET | ORAL | Status: DC | PRN
  Administered 2013-05-06 – 2013-05-07 (×3): 1 via ORAL
  Filled 2013-05-06 (×3): qty 1

## 2013-05-06 MED ORDER — OXYCODONE HCL 5 MG PO TABS: 5.0000 mg | ORAL_TABLET | Freq: Once | ORAL | Status: DC | PRN

## 2013-05-06 MED ORDER — DEXAMETHASONE SODIUM PHOSPHATE 4 MG/ML IJ SOLN
INTRAMUSCULAR | Status: DC | PRN
  Administered 2013-05-06: 4 mg via INTRAVENOUS

## 2013-05-06 MED ORDER — ONDANSETRON HCL 4 MG/2ML IJ SOLN
4.0000 mg | INTRAMUSCULAR | Status: DC | PRN
  Administered 2013-05-06: 4 mg via INTRAVENOUS
  Filled 2013-05-06: qty 2

## 2013-05-06 MED ORDER — LIDOCAINE HCL (CARDIAC) 20 MG/ML IV SOLN
INTRAVENOUS | Status: DC | PRN
Start: 2013-05-06 — End: 2013-05-06
  Administered 2013-05-06: 60 mg via INTRAVENOUS

## 2013-05-06 MED ORDER — HEMOSTATIC AGENTS (NO CHARGE) OPTIME
TOPICAL | Status: DC | PRN
  Administered 2013-05-06 (×2): 1

## 2013-05-06 MED ORDER — HYDROMORPHONE HCL PF 1 MG/ML IJ SOLN
0.2500 mg | INTRAMUSCULAR | Status: DC | PRN
  Administered 2013-05-06 (×2): 0.5 mg via INTRAVENOUS

## 2013-05-06 MED ORDER — SUCCINYLCHOLINE CHLORIDE 20 MG/ML IJ SOLN
INTRAMUSCULAR | Status: DC | PRN
  Administered 2013-05-06: 120 mg via INTRAVENOUS

## 2013-05-06 MED ORDER — ISOSORBIDE MONONITRATE ER 30 MG PO TB24
30.0000 mg | ORAL_TABLET | Freq: Every morning | ORAL | Status: DC
  Administered 2013-05-07: 30 mg via ORAL
  Filled 2013-05-06: qty 1

## 2013-05-06 MED ORDER — ATORVASTATIN CALCIUM 20 MG PO TABS
20.0000 mg | ORAL_TABLET | Freq: Every morning | ORAL | Status: DC
  Administered 2013-05-07: 20 mg via ORAL
  Filled 2013-05-06: qty 1

## 2013-05-06 MED ORDER — THROMBIN 20000 UNITS EX SOLR
CUTANEOUS | Status: AC
  Filled 2013-05-06: qty 20000

## 2013-05-06 MED ORDER — PROMETHAZINE HCL 25 MG PO TABS: 12.5000 mg | ORAL_TABLET | ORAL | Status: DC | PRN

## 2013-05-06 MED ORDER — EPHEDRINE SULFATE 50 MG/ML IJ SOLN
INTRAMUSCULAR | Status: DC | PRN
  Administered 2013-05-06: 5 mg via INTRAVENOUS
  Administered 2013-05-06: 20 mg via INTRAVENOUS

## 2013-05-06 MED ORDER — MECLIZINE HCL 25 MG PO TABS
25.0000 mg | ORAL_TABLET | Freq: Two times a day (BID) | ORAL | Status: DC | PRN
  Filled 2013-05-06: qty 1

## 2013-05-06 MED ORDER — VITAMIN C 500 MG PO TABS
500.0000 mg | ORAL_TABLET | Freq: Every morning | ORAL | Status: DC
  Administered 2013-05-07: 500 mg via ORAL
  Filled 2013-05-06: qty 1

## 2013-05-06 MED ORDER — 0.9 % SODIUM CHLORIDE (POUR BTL) OPTIME
TOPICAL | Status: DC | PRN
  Administered 2013-05-06: 1000 mL

## 2013-05-06 MED ORDER — PROPOFOL 10 MG/ML IV BOLUS
INTRAVENOUS | Status: DC | PRN
  Administered 2013-05-06: 200 mg via INTRAVENOUS

## 2013-05-06 MED ORDER — ROCURONIUM BROMIDE 100 MG/10ML IV SOLN
INTRAVENOUS | Status: DC | PRN
  Administered 2013-05-06: 10 mg via INTRAVENOUS
  Administered 2013-05-06: 40 mg via INTRAVENOUS

## 2013-05-06 MED ORDER — LIDOCAINE HCL (CARDIAC) 20 MG/ML IV SOLN
INTRAVENOUS | Status: AC
  Filled 2013-05-06: qty 5

## 2013-05-06 MED ORDER — DOCUSATE SODIUM 100 MG PO CAPS: 100.0000 mg | ORAL_CAPSULE | Freq: Every day | ORAL | Status: DC | PRN

## 2013-05-06 MED ORDER — SODIUM CHLORIDE 0.9 % IJ SOLN
INTRAMUSCULAR | Status: AC
  Filled 2013-05-06: qty 10

## 2013-05-06 MED ORDER — ACETAMINOPHEN 325 MG PO TABS: 650.0000 mg | ORAL_TABLET | ORAL | Status: DC | PRN

## 2013-05-06 MED ORDER — ALUM & MAG HYDROXIDE-SIMETH 200-200-20 MG/5ML PO SUSP: 30.0000 mL | Freq: Four times a day (QID) | ORAL | Status: DC | PRN

## 2013-05-06 MED ORDER — SUCCINYLCHOLINE CHLORIDE 20 MG/ML IJ SOLN
INTRAMUSCULAR | Status: AC
  Filled 2013-05-06: qty 1

## 2013-05-06 MED ORDER — METHYLPREDNISOLONE ACETATE 40 MG/ML IJ SUSP
INTRAMUSCULAR | Status: DC | PRN
  Administered 2013-05-06: 40 mg

## 2013-05-06 MED ORDER — MIDAZOLAM HCL 5 MG/5ML IJ SOLN
INTRAMUSCULAR | Status: DC | PRN
  Administered 2013-05-06: 2 mg via INTRAVENOUS

## 2013-05-06 SURGICAL SUPPLY — 70 items
APL SKNCLS STERI-STRIP NONHPOA (GAUZE/BANDAGES/DRESSINGS) ×1
BENZOIN TINCTURE PRP APPL 2/3 (GAUZE/BANDAGES/DRESSINGS) ×1 IMPLANT
BUR ROUND PRECISION 4.0 (BURR) ×2 IMPLANT
CANISTER SUCTION 2500CC (MISCELLANEOUS) ×2 IMPLANT
CARTRIDGE OIL MAESTRO DRILL (MISCELLANEOUS) ×1 IMPLANT
CORDS BIPOLAR (ELECTRODE) ×2 IMPLANT
COVER SURGICAL LIGHT HANDLE (MISCELLANEOUS) ×2 IMPLANT
DIFFUSER DRILL AIR PNEUMATIC (MISCELLANEOUS) ×2 IMPLANT
DRAIN CHANNEL 15F RND FF W/TCR (WOUND CARE) IMPLANT
DRAPE POUCH INSTRU U-SHP 10X18 (DRAPES) ×4 IMPLANT
DRAPE SURG 17X23 STRL (DRAPES) ×8 IMPLANT
DURAPREP 26ML APPLICATOR (WOUND CARE) ×2 IMPLANT
ELECT BLADE 4.0 EZ CLEAN MEGAD (MISCELLANEOUS) ×2
ELECT CAUTERY BLADE 6.4 (BLADE) ×2 IMPLANT
ELECT REM PT RETURN 9FT ADLT (ELECTROSURGICAL) ×2
ELECTRODE BLDE 4.0 EZ CLN MEGD (MISCELLANEOUS) IMPLANT
ELECTRODE REM PT RTRN 9FT ADLT (ELECTROSURGICAL) ×1 IMPLANT
EVACUATOR SILICONE 100CC (DRAIN) IMPLANT
FILTER STRAW FLUID ASPIR (MISCELLANEOUS) ×2 IMPLANT
GAUZE SPONGE 4X4 16PLY XRAY LF (GAUZE/BANDAGES/DRESSINGS) ×4 IMPLANT
GLOVE BIO SURGEON STRL SZ7 (GLOVE) ×2 IMPLANT
GLOVE BIO SURGEON STRL SZ8 (GLOVE) ×2 IMPLANT
GLOVE BIOGEL PI IND STRL 7.0 (GLOVE) ×1 IMPLANT
GLOVE BIOGEL PI IND STRL 8 (GLOVE) ×1 IMPLANT
GLOVE BIOGEL PI INDICATOR 7.0 (GLOVE) ×1
GLOVE BIOGEL PI INDICATOR 8 (GLOVE) ×1
GOWN STRL NON-REIN LRG LVL3 (GOWN DISPOSABLE) ×2 IMPLANT
GOWN STRL REIN XL XLG (GOWN DISPOSABLE) ×4 IMPLANT
IV CATH 14GX2 1/4 (CATHETERS) ×2 IMPLANT
KIT BASIN OR (CUSTOM PROCEDURE TRAY) ×2 IMPLANT
KIT POSITION SURG JACKSON T1 (MISCELLANEOUS) ×2 IMPLANT
KIT ROOM TURNOVER OR (KITS) ×2 IMPLANT
NDL 18GX1X1/2 (RX/OR ONLY) (NEEDLE) ×1 IMPLANT
NDL HYPO 25GX1X1/2 BEV (NEEDLE) ×1 IMPLANT
NDL SPNL 18GX3.5 QUINCKE PK (NEEDLE) ×2 IMPLANT
NEEDLE 18GX1X1/2 (RX/OR ONLY) (NEEDLE) ×2 IMPLANT
NEEDLE 22X1 1/2 (OR ONLY) (NEEDLE) ×2 IMPLANT
NEEDLE HYPO 25GX1X1/2 BEV (NEEDLE) ×2 IMPLANT
NEEDLE SPNL 18GX3.5 QUINCKE PK (NEEDLE) ×4 IMPLANT
NS IRRIG 1000ML POUR BTL (IV SOLUTION) ×2 IMPLANT
OIL CARTRIDGE MAESTRO DRILL (MISCELLANEOUS) ×2
PACK LAMINECTOMY ORTHO (CUSTOM PROCEDURE TRAY) ×2 IMPLANT
PACK UNIVERSAL I (CUSTOM PROCEDURE TRAY) ×2 IMPLANT
PAD ARMBOARD 7.5X6 YLW CONV (MISCELLANEOUS) ×4 IMPLANT
PATTIES SURGICAL .5 X.5 (GAUZE/BANDAGES/DRESSINGS) ×1 IMPLANT
PATTIES SURGICAL .5 X1 (DISPOSABLE) ×2 IMPLANT
SPONGE GAUZE 4X4 12PLY (GAUZE/BANDAGES/DRESSINGS) ×2 IMPLANT
SPONGE GAUZE 4X4 12PLY STER LF (GAUZE/BANDAGES/DRESSINGS) ×1 IMPLANT
SPONGE INTESTINAL PEANUT (DISPOSABLE) ×2 IMPLANT
SPONGE SURGIFOAM ABS GEL 100 (HEMOSTASIS) ×2 IMPLANT
STRIP CLOSURE SKIN 1/2X4 (GAUZE/BANDAGES/DRESSINGS) ×1 IMPLANT
SURGIFLO TRUKIT (HEMOSTASIS) IMPLANT
SUT MNCRL AB 4-0 PS2 18 (SUTURE) ×2 IMPLANT
SUT VIC AB 0 CT1 18XCR BRD 8 (SUTURE) IMPLANT
SUT VIC AB 0 CT1 27 (SUTURE)
SUT VIC AB 0 CT1 27XBRD ANBCTR (SUTURE) IMPLANT
SUT VIC AB 0 CT1 8-18 (SUTURE)
SUT VIC AB 1 CT1 18XCR BRD 8 (SUTURE) ×1 IMPLANT
SUT VIC AB 1 CT1 8-18 (SUTURE) ×2
SUT VIC AB 2-0 CT2 18 VCP726D (SUTURE) ×2 IMPLANT
SYR 20CC LL (SYRINGE) IMPLANT
SYR BULB IRRIGATION 50ML (SYRINGE) ×2 IMPLANT
SYR CONTROL 10ML LL (SYRINGE) ×4 IMPLANT
SYR TB 1ML 26GX3/8 SAFETY (SYRINGE) ×4 IMPLANT
SYR TB 1ML LUER SLIP (SYRINGE) ×4 IMPLANT
TAPE CLOTH SURG 4X10 WHT LF (GAUZE/BANDAGES/DRESSINGS) ×1 IMPLANT
TOWEL OR 17X24 6PK STRL BLUE (TOWEL DISPOSABLE) ×2 IMPLANT
TOWEL OR 17X26 10 PK STRL BLUE (TOWEL DISPOSABLE) ×2 IMPLANT
WATER STERILE IRR 1000ML POUR (IV SOLUTION) ×2 IMPLANT
YANKAUER SUCT BULB TIP NO VENT (SUCTIONS) ×2 IMPLANT

## 2013-05-06 NOTE — Anesthesia Procedure Notes (Signed)
Procedure Name: Intubation Date/Time: 05/06/2013 8:32 AM Performed by: Carola Frost Pre-anesthesia Checklist: Patient identified, Timeout performed, Emergency Drugs available, Patient being monitored and Suction available Patient Re-evaluated:Patient Re-evaluated prior to inductionOxygen Delivery Method: Circle system utilized Preoxygenation: Pre-oxygenation with 100% oxygen Intubation Type: IV induction Ventilation: Mask ventilation with difficulty Laryngoscope Size: Mac and 4 Grade View: Grade II Tube type: Oral Tube size: 8.0 mm Number of attempts: 1 Placement Confirmation: positive ETCO2,  ETT inserted through vocal cords under direct vision and breath sounds checked- equal and bilateral Secured at: 24 cm Tube secured with: Tape Dental Injury: Teeth and Oropharynx as per pre-operative assessment

## 2013-05-06 NOTE — Preoperative (Signed)
Beta Blockers   Reason not to administer Beta Blockers:Not Applicable, pt took atenolol 05/06/13

## 2013-05-06 NOTE — Transfer of Care (Signed)
Immediate Anesthesia Transfer of Care Note  Patient: Benjamin George  Procedure(s) Performed: Procedure(s) with comments: Lumbar 4-5 decompression    1 LEVEL (N/A) - Lumbar 4-5 decompression  Patient Location: PACU  Anesthesia Type:General  Level of Consciousness: patient cooperative, lethargic and responds to stimulation  Airway & Oxygen Therapy: Patient Spontanous Breathing and Patient connected to nasal cannula oxygen  Post-op Assessment: Report given to PACU RN and Patient moving all extremities X 4  Post vital signs: Reviewed and stable  Complications: No apparent anesthesia complications

## 2013-05-06 NOTE — Op Note (Signed)
NAME:  Benjamin George, Benjamin George NO.:  [id number]  MEDICAL RECORD NO.:  [id number]  LOCATION:  [id number]                        FACILITY:  [facility]  PHYSICIAN:  Phylliss Bob, MD      DATE OF BIRTH:  06-Dec-1940  DATE OF PROCEDURE:  05/06/2013                              OPERATIVE REPORT   PREOPERATIVE DIAGNOSES: 1. 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  POSTOPERATIVE DIAGNOSES: 1. 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  PROCEDURE:  L4-5 laminectomy with bilateral partial facetectomy and bilateral foraminotomy.  SURGEON:  Phylliss Bob, MD  ASSISTANT:  Pricilla Holm, PA-C  ANESTHESIA:  General endotracheal anesthesia.  COMPLICATIONS:  None.  DISPOSITION:  Stable.  ESTIMATED BLOOD LOSS:  Minimal.  INDICATIONS FOR PROCEDURE:  Briefly, Benjamin George is a very pleasant 73- year-old male who I did evaluate on January 12, 2013, with severe bilateral leg pain.  His pain has been present for approximately 3 years.  His symptoms were very much consistent with neurogenic claudication.  An MRI did reveal moderate to severe spinal stenosis at the L4-5 level.  Of note, given the patient's medical history, we did discuss proceeding with placement of an X-Stop implant.  However, for reasons they are very much unclear to me.  His insurance company will not cover proceeding with the procedure.  I doubt this is very unfortunate, as he was an ideal candidate for such a procedure. However, we did ultimately discussed the alternative of proceeding with a lumbar decompressive procedure.  The patient did fully understand the risks and limitations of the procedure and did wish to proceed.  OPERATIVE DETAILS:  On May 06, 2013, the patient was brought to surgery and general endotracheal anesthesia was administered.  The patient was placed prone on a flat Jackson bed with a spinal frame. Antibiotics were given and the time-out procedure was performed.  The back was  prepped and draped in the usual sterile fashion.  I then made a midline incision overlying the L4-5 interspace.  The fascia was incised at the midline.  The paraspinal musculature was bluntly swept laterally. The lamina of L4 and L5 was identified and subperiosteally exposed.  A lateral intraoperative radiograph to confirm the appropriate operative level.  I then went forward with a central decompression removing the inferior medial aspect of the L4 lamina.  The ligamentum flavum was identified and also removed using a series of Kerrison punches.  In this manner, I was able to perform a thorough lateral recess decompression. The neural foramina were also decompressed in the same manner.  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  I then copiously irrigated the wound.  I did use a Woodson to confirm that there was no residual neurologic compression on the right or the left sides.  I was able to palpate the L4 and L5 pedicles bilaterally.  I was very pleased with the final decompression.  There was no extravasation of cerebrospinal fluid noted.  I did control epidural bleeding using bipolar electrocautery in addition to Surgiflo.  I then introduced 40 mg of Depo-Medrol about the epidural space.  The fascia was then closed using #1 Vicryl.  The subcutaneous layer was closed  using 2-0 Vicryl. The skin was closed using 3-0 Monocryl.  Benzoin and Steri-Strips were applied followed by sterile dressing.  All instrument counts were correct at the termination of the procedure.  Of note, Pricilla Holm was my assistant throughout the entirety of the procedure, and did aid in essential retraction and suctioning needed throughout surgery.     Phylliss Bob, MD     MD/MEDQ  D:  05/06/2013  T:  05/06/2013  Job:  [id number]  cc:   Dian Situ, MD

## 2013-05-06 NOTE — Discharge Instructions (Signed)

## 2013-05-06 NOTE — H&P (Signed)
PREOPERATIVE H&P  Chief Complaint: bilateral leg pain  HPI: Benjamin George is a 73 y.o. male who presents with ongoing bilateral leg pain. MRI = stenosis at L4/5. Patient has failed multiple epidural injections and PT. We previously discussed proceeding with an XSTOP procedure given the patient's cardiac history and additional medical history, but for reasons that are unclear to me, his insurance company has denied coverage of the procedure.   Past Medical History  Diagnosis Date  . HLD (hyperlipidemia)   . Gout   . Heart attack   . HTN (hypertension)   . Gout   . Cardiac arrest   . Sleep apnea     NPSG 10/26/07 - AHI 16.4 used5 yrs  . Arthritis   . Complication of anesthesia   . PONV (postoperative nausea and vomiting)    Past Surgical History  Procedure Laterality Date  . Angioplasty    . Carotid stent    . Nasal septopalsty    . Hand surgery      left   . Total shoulder arthroplasty  04/17/2011    Procedure: TOTAL SHOULDER ARTHROPLASTY; left Surgeon: Nita Sells, MD;  Location: [facility];  Service: Orthopedics;  Laterality: Left;  . Appendectomy     History   Social History  . Marital Status: Married    Spouse Name: N/A    Number of Children: N/A  . Years of Education: N/A   Social History Main Topics  . Smoking status: Former Smoker -- 1.50 packs/day for 30 years    Types: Cigarettes    Quit date: 04/28/1987  . Smokeless tobacco: Never Used     Comment: smoked 2 ppd for 32 years; quit in 1989  . Alcohol Use: 9.6 oz/week    14 Cans of beer, 2 Shots of liquor per week     Comment: 3-4 beers daily   . Drug Use: No  . Sexual Activity: None   Other Topics Concern  . None   Social History Narrative   Married, 2 children.    Retired VP at [employer]    History reviewed. No pertinent family history. Allergies  Allergen Reactions  . Codeine Nausea And Vomiting  . Hydrocodone Nausea And Vomiting  . Methocarbamol Nausea Only  . Other Nausea Only   vibramyacin   Prior to Admission medications   Medication Sig Start Date End Date Taking? Authorizing Provider  acetaminophen (TYLENOL) 500 MG tablet Take 1,000 mg by mouth every 6 (six) hours as needed (for back pain).    Yes Historical Provider, MD  allopurinol (ZYLOPRIM) 100 MG tablet Take 100 mg by mouth every morning.    Yes Historical Provider, MD  aspirin EC 81 MG tablet Take 1 tablet (81 mg total) by mouth daily. 12/12/11  Yes Larey Dresser, MD  atenolol (TENORMIN) 50 MG tablet Take 50 mg by mouth every morning.   Yes Historical Provider, MD  atorvastatin (LIPITOR) 20 MG tablet Take 20 mg by mouth every morning.   Yes Historical Provider, MD  CINNAMON PO Take 1,000 mg by mouth daily.   Yes Historical Provider, MD  Coenzyme Q10 200 MG capsule Take 200 mg by mouth daily.   Yes Historical Provider, MD  docusate sodium (COLACE) 100 MG capsule Take 100 mg by mouth daily as needed for mild constipation.   Yes Historical Provider, MD  isosorbide mononitrate (IMDUR) 30 MG 24 hr tablet Take 30 mg by mouth every morning.   Yes Historical Provider, MD  losartan-hydrochlorothiazide Konrad Penta)  100-12.5 MG per tablet Take 1 tablet by mouth every morning.   Yes Historical Provider, MD  meclizine (ANTIVERT) 25 MG tablet Take 25 mg by mouth 2 (two) times daily as needed (for vertigo).   Yes Historical Provider, MD  Multiple Vitamin (MULTIVITAMIN) tablet Take 1 tablet by mouth every morning.    Yes Historical Provider, MD  vitamin B-12 (CYANOCOBALAMIN) 100 MCG tablet Take 200 mcg by mouth every morning.    Yes Historical Provider, MD  vitamin C (ASCORBIC ACID) 500 MG tablet Take 500 mg by mouth every morning.   Yes Historical Provider, MD     All other systems have been reviewed and were otherwise negative with the exception of those mentioned in the HPI and as above.  Physical Exam: Filed Vitals:   05/06/13 0632  BP: 114/60  Pulse: 63  Temp: 97.4 F (36.3 C)  Resp: 20    General: Alert, no  acute distress Cardiovascular: No pedal edema Respiratory: No cyanosis, no use of accessory musculature Skin: No lesions in the area of chief complaint Neurologic: Sensation intact distally Psychiatric: Patient is competent for consent with normal mood and affect Lymphatic: No axillary or cervical lymphadenopathy   Assessment/Plan: Bilateral leg pain Plan for Procedure(s): Lumbar 4-5 decompression 1 LEVEL   Sinclair Ship, MD 05/06/2013 8:12 AM

## 2013-05-06 NOTE — Anesthesia Postprocedure Evaluation (Signed)
  Anesthesia Post-op Note  Patient: Benjamin George  Procedure(s) Performed: Procedure(s) with comments: Lumbar 4-5 decompression    1 LEVEL (N/A) - Lumbar 4-5 decompression  Patient Location: PACU  Anesthesia Type:General  Level of Consciousness: awake, alert  and oriented  Airway and Oxygen Therapy: Patient Spontanous Breathing  Post-op Pain: mild  Post-op Assessment: Post-op Vital signs reviewed, Patient's Cardiovascular Status Stable, Respiratory Function Stable, Patent Airway, No signs of Nausea or vomiting and Pain level controlled  Post-op Vital Signs: Reviewed and stable  Complications: No apparent anesthesia complications

## 2013-05-06 NOTE — Progress Notes (Signed)
Spouse and patient requested to stay overnight. Dr. Lynann Bologna notified.

## 2013-05-06 NOTE — Anesthesia Preprocedure Evaluation (Addendum)
Anesthesia Evaluation  Patient identified by MRN, date of birth, ID band Patient awake    Reviewed: Allergy & Precautions, H&P , NPO status , Patient's Chart, lab work & pertinent test results, reviewed documented beta blocker date and time   History of Anesthesia Complications (+) PONV and history of anesthetic complications  Airway Mallampati: II TM Distance: >3 FB Neck ROM: Full    Dental  (+) Teeth Intact   Pulmonary sleep apnea and Continuous Positive Airway Pressure Ventilation , former smoker,    Pulmonary exam normal       Cardiovascular Exercise Tolerance: Poor hypertension, Pt. on medications and Pt. on home beta blockers + CAD and + Past MI + dysrhythmias Rhythm:Regular Rate:Normal  Few PACs and PVCs   Neuro/Psych Lower back pain with numbness and weakness down right leg  Neuromuscular disease negative psych ROS   GI/Hepatic negative GI ROS, Neg liver ROS,   Endo/Other  neg diabetesMorbid obesity  Renal/GU negative Renal ROS  negative genitourinary   Musculoskeletal   Abdominal (+) + obese,   Peds  Hematology   Anesthesia Other Findings   Reproductive/Obstetrics                          Anesthesia Physical Anesthesia Plan  ASA: III  Anesthesia Plan: General   Post-op Pain Management:    Induction:   Airway Management Planned: Oral ETT  Additional Equipment: None  Intra-op Plan:   Post-operative Plan: Extubation in OR  Informed Consent: I have reviewed the patients History and Physical, chart, labs and discussed the procedure including the risks, benefits and alternatives for the proposed anesthesia with the patient or authorized representative who has indicated his/her understanding and acceptance.   Dental advisory given  Plan Discussed with: CRNA and Surgeon  Anesthesia Plan Comments:         Anesthesia Quick Evaluation

## 2013-05-07 ENCOUNTER — Encounter (HOSPITAL_COMMUNITY): Payer: Self-pay | Admitting: Orthopedic Surgery

## 2013-05-07 NOTE — Plan of Care (Signed)
D/c instructions and med script given to pt, v/u. Pt is stable to be d/c.

## 2013-05-07 NOTE — Progress Notes (Signed)
Patient doing well. Has been ambulating to bathroom without previously noted bilateral leg pain.  BP 121/60  Pulse 64  Temp(Src) 97.8 F (36.6 C) (Axillary)  Resp 18  Ht 6' (1.829 m)  Wt 276 lb (125.193 kg)  BMI 37.42 kg/m2  SpO2 97%  NVI Dressing in place  POD #1 after L4/5 decompression, doing well with resolution of leg pain  - back precautions reviewed with patient - OK to d/c home today - f/u 2 weeks

## 2013-05-20 ENCOUNTER — Other Ambulatory Visit: Payer: Self-pay | Admitting: Cardiology

## 2013-05-21 NOTE — Discharge Summary (Signed)
Patient ID: Benjamin George MRN: [id number] DOB/AGE: Jun 07, 1940 73 y.o.  Admit date: 05/06/2013 Discharge date: 05/07/2013  Admission Diagnoses:  Active Problems:   Spinal stenosis   Discharge Diagnoses:  Same  Past Medical History  Diagnosis Date  . HLD (hyperlipidemia)   . Gout   . Heart attack   . HTN (hypertension)   . Gout   . Cardiac arrest   . Sleep apnea     NPSG 10/26/07 - AHI 16.4 used5 yrs  . Arthritis   . Complication of anesthesia   . PONV (postoperative nausea and vomiting)     Surgeries: Procedure(s): Lumbar 4-5 decompression    1 LEVEL on 05/06/2013   Discharged Condition: Improved  Hospital Course: Benjamin George is an 73 y.o. male who was admitted 05/06/2013 for operative treatment of neurogenic claudication. Patient has severe unremitting pain that affects sleep, daily activities, and work/hobbies. After pre-op clearance the patient was taken to the operating room on 05/06/2013 and underwent  Procedure(s): Lumbar 4-5 decompression    1 LEVEL.    Patient was given perioperative antibiotics:  Anti-infectives   Start     Dose/Rate Route Frequency Ordered Stop   05/06/13 1700  ceFAZolin (ANCEF) IVPB 1 g/50 mL premix     1 g 100 mL/hr over 30 Minutes Intravenous Every 8 hours 05/06/13 1651 05/07/13 0143   05/06/13 0600  ceFAZolin (ANCEF) 3 g in dextrose 5 % 50 mL IVPB     3 g 160 mL/hr over 30 Minutes Intravenous On call to O.R. 05/05/13 1414 05/06/13 0837       Patient was given sequential compression devices, early ambulation to prevent DVT.  Patient benefited maximally from hospital stay and there were no complications.    Recent vital signs: BP 132/76  Pulse 68  Temp(Src) 97.7 F (36.5 C) (Oral)  Resp 18  Ht 6' (1.829 m)  Wt 125.193 kg (276 lb)  BMI 37.42 kg/m2  SpO2 93%   Discharge Medications:     Medication List    STOP taking these medications       acetaminophen 500 MG tablet  Commonly known as:  TYLENOL     aspirin EC 81 MG  tablet      TAKE these medications       allopurinol 100 MG tablet  Commonly known as:  ZYLOPRIM  Take 100 mg by mouth every morning.     atenolol 50 MG tablet  Commonly known as:  TENORMIN  Take 50 mg by mouth every morning.     atorvastatin 20 MG tablet  Commonly known as:  LIPITOR  Take 20 mg by mouth every morning.     CINNAMON PO  Take 1,000 mg by mouth daily.     Coenzyme Q10 200 MG capsule  Take 200 mg by mouth daily.     docusate sodium 100 MG capsule  Commonly known as:  COLACE  Take 100 mg by mouth daily as needed for mild constipation.     isosorbide mononitrate 30 MG 24 hr tablet  Commonly known as:  IMDUR  Take 30 mg by mouth every morning.     losartan-hydrochlorothiazide 100-12.5 MG per tablet  Commonly known as:  HYZAAR  Take 1 tablet by mouth every morning.     meclizine 25 MG tablet  Commonly known as:  ANTIVERT  Take 25 mg by mouth 2 (two) times daily as needed (for vertigo).     multivitamin tablet  Take 1 tablet by mouth  every morning.     vitamin B-12 100 MCG tablet  Commonly known as:  CYANOCOBALAMIN  Take 200 mcg by mouth every morning.     vitamin C 500 MG tablet  Commonly known as:  ASCORBIC ACID  Take 500 mg by mouth every morning.        Diagnostic Studies: Dg Chest 2 View  04/27/2013   CLINICAL DATA:  Preop  EXAM: CHEST  2 VIEW  COMPARISON:  04/12/2011  FINDINGS: Heart size is normal. Negative for heart failure. Mild lingular scarring is unchanged. Negative for pneumonia or effusion.  IMPRESSION: No active cardiopulmonary disease.   Electronically Signed   By: Franchot Gallo M.D.   On: 04/27/2013 11:47   Dg Lumbar Spine 2-3 Views  05/06/2013   CLINICAL DATA:  Lumbar disc protrusion.  EXAM: LUMBAR SPINE - 2-3 VIEW  COMPARISON:  MRI dated 10/30/2012  FINDINGS: Lateral view of the lumbar spine demonstrates needles at the level of the L3 spinous process and the L5 spinous process.  Second radiograph demonstrates instruments at the L3-4  and L4-5 levels.  IMPRESSION: Instruments at L3-4 and L4-5.   Electronically Signed   By: Rozetta Nunnery M.D.   On: 05/06/2013 09:53    Disposition: 01-Home or Self Care    POD #1 after L4/5 decompression, doing well with resolution of leg pain  - back precautions reviewed with patient  - OK to d/c home today  - f/u 2 weeks  Signed: Justice Britain 05/21/2013, 7:46 AM

## 2013-06-04 ENCOUNTER — Other Ambulatory Visit: Payer: Self-pay | Admitting: Cardiology

## 2013-08-09 ENCOUNTER — Other Ambulatory Visit: Payer: Self-pay | Admitting: Cardiology

## 2013-09-17 ENCOUNTER — Ambulatory Visit (INDEPENDENT_AMBULATORY_CARE_PROVIDER_SITE_OTHER): Payer: Medicare Other | Admitting: Cardiology

## 2013-09-17 ENCOUNTER — Encounter: Payer: Self-pay | Admitting: Cardiology

## 2013-09-17 VITALS — BP 122/68 | HR 66 | Ht 71.5 in | Wt 262.0 lb

## 2013-09-17 DIAGNOSIS — E785 Hyperlipidemia, unspecified: Secondary | ICD-10-CM

## 2013-09-17 DIAGNOSIS — I2510 Atherosclerotic heart disease of native coronary artery without angina pectoris: Secondary | ICD-10-CM

## 2013-09-17 NOTE — Patient Instructions (Signed)
Your physician wants you to follow-up in: 1 year with Dr Aundra Dubin. (June 2016). You will receive a reminder letter in the mail two months in advance. If you don't receive a letter, please call our office to schedule the follow-up appointment.

## 2013-09-18 NOTE — Progress Notes (Signed)
Patient ID: Benjamin George, male   DOB: [birth year], 73 y.o.   MRN: [id number] PCP: Dr. Maudie Mercury  73 yo patient with history of CAD presents for cardiology followup.  He had RCA angioplasty in 1990 and PCI to CFX in 1997. Left heart cath in 2009 showed 60% ostial RCA stenosis and patent CFX stent.  Echo in 8/12 showed normal EF with mild Benjamin and AI.  He continues to use his CPAP for OSA.    Since last appointment, Benjamin George had a lumbar laminectomy.  This helped with sciatica symptoms but he still has low back pain.  Weight is stable.  No exertional chest pain or dyspnea.  He went on a [employer] cruise recently and thinks that is why his weight is not down any.  He uses a recumbent bike and plans to start walking 1.5 miles daily.   ECG: NSR, 1st degree AV block  Labs (2/13): K 4.7, creatinine 1.09, HDL 42, LDL 78 Labs (3/14): K 5.1, creatinine 1.17, HDL 49, LDL 92 Labs (5/14): LDL 72, HDL 50 Labs (11/14): LDL 79, HDL 48 Labs (1/15): K 4.4, creatinine 1.1  PMH: 1. OSA: CPAP 2. HTN 3. Obesity 4. PVCs: Holter (12/14) showed rare PVCs and PACs.  5. CAD: Inferior MI in 1990 treated with tPA then RCA angioplasty.  PCI to CFX in 1997.  LHC (3/09) with 60% ostial RCA, patent CFX stent, EF 45%.  Echo (8/12): EF 64-40%, grade I diastolic dysfunction, mild Benjamin, mild AI.  6. Gout 7. Low back pain: spinal stenosis. Lumbar laminectomy in 1/15.  8. Left shoulder replacement  SH: Married, retired from [employer], stopped smoking in 1989, occasional ETOH.   FH: Premature CAD  ROS: All systems reviewed and negative except as per HPI.    Current Outpatient Prescriptions  Medication Sig Dispense Refill  . allopurinol (ZYLOPRIM) 100 MG tablet Take 100 mg by mouth every morning.       Marland Kitchen aspirin 81 MG tablet Take 81 mg by mouth daily.      Marland Kitchen atenolol (TENORMIN) 50 MG tablet TAKE 1 TABLET BY MOUTH ONCE DAILY  90 tablet  1  . atorvastatin (LIPITOR) 20 MG tablet Take 20 mg by mouth every morning.      Marland Kitchen  CINNAMON PO Take 1,000 mg by mouth daily.      . Coenzyme Q10 200 MG capsule Take 200 mg by mouth daily.      Marland Kitchen docusate sodium (COLACE) 100 MG capsule Take 100 mg by mouth daily as needed for mild constipation.      . isosorbide mononitrate (IMDUR) 30 MG 24 hr tablet Take 30 mg by mouth every morning.      Marland Kitchen losartan-hydrochlorothiazide (HYZAAR) 100-12.5 MG per tablet TAKE 1 TABLET BY MOUTH ONCE DAILY  90 tablet  0  . meclizine (ANTIVERT) 25 MG tablet Take 25 mg by mouth 2 (two) times daily as needed (for vertigo).      . meloxicam (MOBIC) 15 MG tablet Take 1 tablet by mouth daily as needed.      . Multiple Vitamin (MULTIVITAMIN) tablet Take 1 tablet by mouth every morning.       . vitamin B-12 (CYANOCOBALAMIN) 100 MCG tablet Take 200 mcg by mouth every morning.       . vitamin C (ASCORBIC ACID) 500 MG tablet Take 500 mg by mouth every morning.       No current facility-administered medications for this visit.    BP 122/68  Pulse  66  Ht 5' 11.5" (1.816 m)  Wt 118.842 kg (262 lb)  BMI 36.04 kg/m2 General: NAD, obese Neck: Thick neck, no JVD, no thyromegaly or thyroid nodule.  Lungs: Clear to auscultation bilaterally with normal respiratory effort. CV: Nondisplaced PMI.  Heart regular S1/S2, no S3/S4, no murmur.  No peripheral edema.  No carotid bruit.  2+ PT pulse on left and trace PT pulse on right.  Abdomen: Soft, nontender, no hepatosplenomegaly, no distention.  Neurologic: Alert and oriented x 3.  Psych: Normal affect. Extremities: No clubbing or cyanosis.   Assessment/Plan: 1. CAD: No ischemic symptoms.  Continue ASA 81, statin, ARB, and beta blocker.  2. Hyperlipidemia:Good lipids in 11/14.  3. HTN: BP is under good control. 4. Palpitations: Not noting recently.  12/14 holter showed rare PVCs and PACs.  5. Obesity: Needs to work on diet and exercise for weight loss.    Loralie Champagne 09/18/2013

## 2013-10-02 ENCOUNTER — Encounter: Payer: Self-pay | Admitting: Cardiology

## 2013-10-08 ENCOUNTER — Other Ambulatory Visit: Payer: Self-pay | Admitting: Cardiology

## 2013-10-20 ENCOUNTER — Encounter: Payer: Self-pay | Admitting: Gastroenterology

## 2013-11-18 ENCOUNTER — Other Ambulatory Visit: Payer: Self-pay | Admitting: Cardiology

## 2013-12-07 ENCOUNTER — Other Ambulatory Visit: Payer: Self-pay | Admitting: Cardiology

## 2014-04-07 ENCOUNTER — Other Ambulatory Visit: Payer: Self-pay | Admitting: Cardiology

## 2014-04-12 DIAGNOSIS — R739 Hyperglycemia, unspecified: Secondary | ICD-10-CM | POA: Diagnosis not present

## 2014-04-12 DIAGNOSIS — Z125 Encounter for screening for malignant neoplasm of prostate: Secondary | ICD-10-CM | POA: Diagnosis not present

## 2014-04-12 DIAGNOSIS — I10 Essential (primary) hypertension: Secondary | ICD-10-CM | POA: Diagnosis not present

## 2014-04-14 ENCOUNTER — Encounter: Payer: Self-pay | Admitting: Cardiology

## 2014-04-14 DIAGNOSIS — I10 Essential (primary) hypertension: Secondary | ICD-10-CM | POA: Diagnosis not present

## 2014-04-14 DIAGNOSIS — E780 Pure hypercholesterolemia: Secondary | ICD-10-CM | POA: Diagnosis not present

## 2014-04-14 DIAGNOSIS — Z0000 Encounter for general adult medical examination without abnormal findings: Secondary | ICD-10-CM | POA: Diagnosis not present

## 2014-04-14 DIAGNOSIS — R972 Elevated prostate specific antigen [PSA]: Secondary | ICD-10-CM | POA: Diagnosis not present

## 2014-04-29 ENCOUNTER — Encounter: Payer: Self-pay | Admitting: Gastroenterology

## 2014-04-29 DIAGNOSIS — R972 Elevated prostate specific antigen [PSA]: Secondary | ICD-10-CM | POA: Diagnosis not present

## 2014-05-03 ENCOUNTER — Other Ambulatory Visit: Payer: Self-pay | Admitting: Cardiology

## 2014-05-12 ENCOUNTER — Other Ambulatory Visit: Payer: Self-pay | Admitting: Cardiology

## 2014-05-20 ENCOUNTER — Other Ambulatory Visit: Payer: Self-pay | Admitting: Cardiology

## 2014-06-02 ENCOUNTER — Ambulatory Visit (AMBULATORY_SURGERY_CENTER): Payer: Self-pay | Admitting: *Deleted

## 2014-06-02 VITALS — Ht 72.0 in | Wt 267.6 lb

## 2014-06-02 DIAGNOSIS — Z1211 Encounter for screening for malignant neoplasm of colon: Secondary | ICD-10-CM

## 2014-06-02 MED ORDER — MOVIPREP 100 G PO SOLR
1.0000 | Freq: Once | ORAL | Status: DC
Start: 2014-06-02 — End: 2014-06-16

## 2014-06-02 NOTE — Progress Notes (Signed)
Denies allergies to eggs or soy products. Denies complications with sedation or anesthesia. Denies O2 use. Denies use of diet or weight loss medications.  Emmi instructions given for colonoscopy.  

## 2014-06-03 ENCOUNTER — Telehealth: Payer: Self-pay | Admitting: Gastroenterology

## 2014-06-03 NOTE — Telephone Encounter (Signed)
Called pt no answer/Mailed copy of split dose Miralax instructions.  Angela/PV

## 2014-06-16 ENCOUNTER — Ambulatory Visit (AMBULATORY_SURGERY_CENTER): Payer: Medicare Other | Admitting: Gastroenterology

## 2014-06-16 ENCOUNTER — Encounter: Payer: Self-pay | Admitting: Gastroenterology

## 2014-06-16 VITALS — BP 120/56 | HR 55 | Temp 95.0°F | Resp 24 | Ht 72.0 in | Wt 267.0 lb

## 2014-06-16 DIAGNOSIS — K635 Polyp of colon: Secondary | ICD-10-CM | POA: Diagnosis not present

## 2014-06-16 DIAGNOSIS — Z1211 Encounter for screening for malignant neoplasm of colon: Secondary | ICD-10-CM

## 2014-06-16 DIAGNOSIS — D123 Benign neoplasm of transverse colon: Secondary | ICD-10-CM

## 2014-06-16 DIAGNOSIS — D120 Benign neoplasm of cecum: Secondary | ICD-10-CM

## 2014-06-16 DIAGNOSIS — I252 Old myocardial infarction: Secondary | ICD-10-CM | POA: Diagnosis not present

## 2014-06-16 DIAGNOSIS — D125 Benign neoplasm of sigmoid colon: Secondary | ICD-10-CM

## 2014-06-16 MED ORDER — SODIUM CHLORIDE 0.9 % IV SOLN: 500.0000 mL | INTRAVENOUS | Status: DC

## 2014-06-16 NOTE — Progress Notes (Signed)
Stable to RR 

## 2014-06-16 NOTE — Progress Notes (Signed)
No problems noted in the recovery room. maw 

## 2014-06-16 NOTE — Progress Notes (Signed)
Called to room to assist during endoscopic procedure.  Patient ID and intended procedure confirmed with present staff. Received instructions for my participation in the procedure from the performing physician.  

## 2014-06-16 NOTE — Patient Instructions (Signed)
YOU HAD AN ENDOSCOPIC PROCEDURE TODAY AT THE [facility]:   Refer to the procedure report that was given to you for any specific questions about what was found during the examination.  If the procedure report does not answer your questions, please call your gastroenterologist to clarify.  If you requested that your care partner not be given the details of your procedure findings, then the procedure report has been included in a sealed envelope for you to review at your convenience later.  YOU SHOULD EXPECT: Some feelings of bloating in the abdomen. Passage of more gas than usual.  Walking can help get rid of the air that was put into your GI tract during the procedure and reduce the bloating. If you had a lower endoscopy (such as a colonoscopy or flexible sigmoidoscopy) you may notice spotting of blood in your stool or on the toilet paper. If you underwent a bowel prep for your procedure, you may not have a normal bowel movement for a few days.  Please Note:  You might notice some irritation and congestion in your nose or some drainage.  This is from the oxygen used during your procedure.  There is no need for concern and it should clear up in a day or so.  SYMPTOMS TO REPORT IMMEDIATELY:   Following lower endoscopy (colonoscopy or flexible sigmoidoscopy):  Excessive amounts of blood in the stool  Significant tenderness or worsening of abdominal pains  Swelling of the abdomen that is new, acute  Fever of 100F or higher   For urgent or emergent issues, a gastroenterologist can be reached at any hour by calling [phone].   DIET: Your first meal following the procedure should be a small meal and then it is ok to progress to your normal diet. Heavy or fried foods are harder to digest and may make you feel nauseous or bloated.  Likewise, meals heavy in dairy and vegetables can increase bloating.  Drink plenty of fluids but you should avoid alcoholic beverages for 24  hours.  ACTIVITY:  You should plan to take it easy for the rest of today and you should NOT DRIVE or use heavy machinery until tomorrow (because of the sedation medicines used during the test).    FOLLOW UP: Our staff will call the number listed on your records the next business day following your procedure to check on you and address any questions or concerns that you may have regarding the information given to you following your procedure. If we do not reach you, we will leave a message.  However, if you are feeling well and you are not experiencing any problems, there is no need to return our call.  We will assume that you have returned to your regular daily activities without incident.  If any biopsies were taken you will be contacted by phone or by letter within the next 1-3 weeks.  Please call us at [phone] if you have not heard about the biopsies in 3 weeks.    SIGNATURES/CONFIDENTIALITY: You and/or your care partner have signed paperwork which will be entered into your electronic medical record.  These signatures attest to the fact that that the information above on your After Visit Summary has been reviewed and is understood.  Full responsibility of the confidentiality of this discharge information lies with you and/or your care-partner.    Handouts were given to your care partner on polyps, diverticulosis,and a high fiber diet with liberal fluid intake. You might notice some   irritation in your nose or drainage.  This may cause feelings of congestion.  This is from the oxygen, which can be drying.  This is no cause for concern; this should clear up in a few days.  You may resume your current medications today. Await biopsy results. Please call if any questions or concerns.   

## 2014-06-16 NOTE — Op Note (Signed)
[facility]  [address] [place], [id number]   COLONOSCOPY PROCEDURE REPORT PATIENT: Benjamin George, Benjamin George  MR#: [id number] BIRTHDATE: 1940/07/06 , 73  yrs. old GENDER: male ENDOSCOPIST: Ladene Artist, MD, [facility] REFERRED WO:EHOZY Kim, M.D. PROCEDURE DATE:  06/16/2014 PROCEDURE:   Colonoscopy, screening and Colonoscopy with snare polypectomy First Screening Colonoscopy - Avg.  risk and is 50 yrs.  old or older - No.  Prior Negative Screening - Now for repeat screening. 10 or more years since last screening  History of Adenoma - Now for follow-up colonoscopy & has been > or = to 3 yrs.  N/A  Polyps Removed Today? Yes. ASA CLASS:   Class III INDICATIONS:Screening for colonic neoplasia and Average Risk. MEDICATIONS: Monitored anesthesia care, Propofol 250 mg IV, and lidocaine 40 mg IV DESCRIPTION OF PROCEDURE:   After the risks benefits and alternatives of the procedure were thoroughly explained, informed consent was obtained.  The digital rectal exam revealed no abnormalities of the rectum.   The LB [id number]  endoscope was introduced through the anus and advanced to the cecum, which was identified by both the appendix and ileocecal valve. No adverse events experienced.   The quality of the prep was good.  (MoviPrep was used)  The instrument was then slowly withdrawn as the colon was fully examined.  COLON FINDINGS: Two sessile polyps measuring 5-6 mm in size were found at the cecum.  Polypectomies were performed with a cold snare.  The resection was complete, the polyp tissue was completely retrieved and sent to histology. Two sessile polyps measuring 5 mm in size were found in the transverse colon.  Polypectomies were performed with a cold snare.  Three sessile polyps measuring 5-6 mm in size were found in the sigmoid colon.  Polypectomies were performed with a cold snare.  The resection was complete, the polyp tissue was completely retrieved and sent to  histology. There was mild diverticulosis noted in the sigmoid colon and transverse colon.   The examination was otherwise normal.  Retroflexed views revealed no abnormalities. The time to cecum = 3.2 Withdrawal time = 14.4   The scope was withdrawn and the procedure completed. COMPLICATIONS: There were no immediate complications.  ENDOSCOPIC IMPRESSION: 1.   Two sessile polyps at the cecum; polypectomies performed with a cold snare 2.   Two sessile polyps in the transverse colon; polypectomies performed with a cold snare 3.   Three sessile polyps in the sigmoid colon; polypectomies performed with a cold snare 4.   Mild diverticulosis in the sigmoid colon and transverse colon  RECOMMENDATIONS: 1.  Await pathology results 2.  Repeat colonoscopy in 3 years if 3 or more polyps adenomatous; 5 years if 1-2 adenomatous; otherwise, given your age, you will not need another colonoscopy for colon cancer screening or polyp surveillance.  These types of tests usually stop around the age 42.   eSigned:  Ladene Artist, MD, [facility] 06/16/2014 11:14 AM    PATIENT NAME:  Benjamin George, Benjamin George MR#: [id number]

## 2014-06-17 ENCOUNTER — Telehealth: Payer: Self-pay | Admitting: *Deleted

## 2014-06-17 NOTE — Telephone Encounter (Signed)
  Follow up Call-  Call back number 06/16/2014  Post procedure Call Back phone  # [phone]  Permission to leave phone message Yes     Patient questions:  Do you have a fever, pain , or abdominal swelling? No. Pain Score  0 *  Have you tolerated food without any problems? Yes.    Have you been able to return to your normal activities? Yes.    Do you have any questions about your discharge instructions: Diet   No. Medications  No. Follow up visit  No.  Do you have questions or concerns about your Care? No.  Actions: * If pain score is 4 or above: No action needed, pain <4.  Information provided per wife.

## 2014-06-21 ENCOUNTER — Encounter: Payer: Self-pay | Admitting: Gastroenterology

## 2014-06-22 DIAGNOSIS — G4733 Obstructive sleep apnea (adult) (pediatric): Secondary | ICD-10-CM | POA: Diagnosis not present

## 2014-06-30 ENCOUNTER — Telehealth: Payer: Self-pay | Admitting: Cardiology

## 2014-06-30 NOTE — Telephone Encounter (Signed)
Spoke with pt. He reports heart rate is usually around 70 and blood pressure 95-120/55-65.  Today when he got up heart rate was around 100.  Checked at [employer] and [facility] and it was 100-105.  He was able to go home and work in yard but heart rate has remained around 100.  He has occasional lightheadedness. Did not drink much water when outside today but heart rate was up before going outside.  He checked pulse when on phone with me and it was around 100.  Pt reports heart rate feels fast but regular.  I reviewed with Dr. Aundra Dubin who would like pt to have office visit tomorrow.  I spoke with pt and appt made for him to see Ignacia Bayley, NP tomorrow at 9 AM. Pt aware if symptoms worsen he should go to ED.

## 2014-06-30 NOTE — Telephone Encounter (Signed)
New message     Patient calling  C/o Pulse rate up to  100  Range. B/p 96/56

## 2014-07-01 ENCOUNTER — Ambulatory Visit (INDEPENDENT_AMBULATORY_CARE_PROVIDER_SITE_OTHER): Payer: Medicare Other | Admitting: Physician Assistant

## 2014-07-01 ENCOUNTER — Telehealth: Payer: Self-pay | Admitting: Physician Assistant

## 2014-07-01 ENCOUNTER — Encounter: Payer: Self-pay | Admitting: Physician Assistant

## 2014-07-01 VITALS — BP 94/58 | HR 73 | Ht 72.0 in | Wt 267.2 lb

## 2014-07-01 DIAGNOSIS — R002 Palpitations: Secondary | ICD-10-CM

## 2014-07-01 LAB — BASIC METABOLIC PANEL
BUN: 23 mg/dL (ref 6–23)
CO2: 27 mEq/L (ref 19–32)
Calcium: 8.7 mg/dL (ref 8.4–10.5)
Chloride: 103 mEq/L (ref 96–112)
Creatinine, Ser: 1.5 mg/dL (ref 0.40–1.50)
GFR: 48.53 mL/min — ABNORMAL LOW (ref >60.00)
Glucose, Bld: 111 mg/dL — ABNORMAL HIGH (ref 70–99)
Potassium: 4.5 mEq/L (ref 3.5–5.1)
Sodium: 137 mEq/L (ref 135–145)

## 2014-07-01 NOTE — Patient Instructions (Addendum)
Increase water intake to 6 glasses per day. Make sure to drink at least 8 ounces of water every time you exercise.   If you continue to have problems with dizziness, light-headed feeling or fast heart rate, call the office and we will schedule a Nurse visit for orthostatic vital signs, blood pressure lying, sitting and standing.   Try to increase cardiovascular exercise to 3 times a week for 30 minutes or more.  Follow up with Dr Aundra Dubin in 6 months.a letter will be sent  To your home for you to call back to the office and schedule your appointment   Lab work today to check kidney function and electrolytes.

## 2014-07-01 NOTE — Progress Notes (Signed)
Cardiology Office Note   Date:  07/01/2014   ID:  Benjamin George, DOB 27-Mar-1941, MRN [id number]  PCP:  [name], MD  Cardiologist:  Dr Waynetta Pean, PA-C   Chief Complaint  Patient presents with  . Palpitations     History of Present Illness: Benjamin George is a 74 y.o. male who presents for with history of CAD presents for cardiology followup. He had RCA angioplasty in 1990 and PCI to CFX in 1997. Left heart cath in 2009 showed 60% ostial RCA stenosis and patent CFX stent, EF 45%. Echo in 8/12 showed normal EF with mild MR and AI. He continues to use his CPAP for OSA.Last o.v. 2015. Dr Maudie Mercury follows his cholesterol.  [name] generally does very well. He exercises regularly and does not get chest pain or unusual SOB with this. He checks his BP/HR regularly, sometimes twice a day. His blood pressure runs on the low side chronically with a heart rate in the 70s. He gets light-headed at times.   He had a colonoscopy about 10 days ago with 7 colon polyps removed (none cancerous but 2 were precancerous). He has constipation regularly, but does not have to take much for it. No blood or melena in stools. He drinks very little liquids, about 2 glasses of water daily, 1 soda and a couple of light beers. He has been told he is prediabetic. Compliant w/ meds and CPAP. He frequently eats fast food for breakfast.  The incident yesterday had never happened before. He noticed his heart rate was 106 in the morning and was high every time he checked it. He was a little light-headed all day. No chest pain or SOB. His heart rate was regular, he thought, and a nurse friend also said this. He cannot remember if his lightheaded feeling was worse with position change. He was concerned and got scheduled for a visit today.   Today he is feeling better. Not light-headed. Took his usual medications, and had a yogurt for breakfast.   Past Medical History  Diagnosis Date  . HLD  (hyperlipidemia)   . Gout   . Heart attack   . HTN (hypertension)   . Gout   . Cardiac arrest   . Sleep apnea     NPSG 10/26/07 - AHI 16.4 used5 yrs  . Arthritis   . Complication of anesthesia   . PONV (postoperative nausea and vomiting)     Past Surgical History  Procedure Laterality Date  . Angioplasty    . Carotid stent    . Nasal septopalsty    . Hand surgery      left   . Total shoulder arthroplasty  04/17/2011    Procedure: TOTAL SHOULDER ARTHROPLASTY; left Surgeon: Nita Sells, MD;  Location: [facility];  Service: Orthopedics;  Laterality: Left;  . Appendectomy    . Lumbar laminectomy/decompression microdiscectomy N/A 05/06/2013    Procedure: Lumbar 4-5 decompression    1 LEVEL;  Surgeon: Sinclair Ship, MD;  Location: [facility];  Service: Orthopedics;  Laterality: N/A;  Lumbar 4-5 decompression  . Coronary angioplasty with stent placement  06/27/2007    L main OK, LAD mild irreg, CFX stent OK, irreg, RCA 60% calcified, EF 45%    Current Outpatient Prescriptions  Medication Sig Dispense Refill  . Acetaminophen 500 MG coapsule Take by mouth every 4 (four) hours as needed for fever.    Marland Kitchen allopurinol (ZYLOPRIM) 100 MG tablet Take 100  mg by mouth every morning.     Marland Kitchen aspirin 81 MG tablet Take 81 mg by mouth daily.    Marland Kitchen atenolol (TENORMIN) 50 MG tablet TAKE 1 TABLET BY MOUTH ONCE DAILY 90 tablet 3  . atorvastatin (LIPITOR) 20 MG tablet TAKE 1 TABLET BY MOUTH ONCE DAILY 30 tablet 5  . CINNAMON PO Take 1,000 mg by mouth daily.    . Coenzyme Q10 200 MG capsule Take 200 mg by mouth daily.    Marland Kitchen docusate sodium (COLACE) 100 MG capsule Take 100 mg by mouth daily as needed for mild constipation.    . isosorbide mononitrate (IMDUR) 30 MG 24 hr tablet TAKE 1 TABLET BY MOUTH ONCE DAILY 30 tablet 3  . losartan-hydrochlorothiazide (HYZAAR) 100-12.5 MG per tablet TAKE 1 TABLET BY MOUTH ONCE DAILY 90 tablet 0  . meclizine (ANTIVERT) 25 MG tablet Take 25 mg by mouth 2 (two) times  daily as needed (for vertigo).    . meloxicam (MOBIC) 15 MG tablet Take 1 tablet by mouth daily as needed.    . Multiple Vitamin (MULTIVITAMIN) tablet Take 1 tablet by mouth every morning.     . traMADol (ULTRAM) 50 MG tablet Take by mouth every 6 (six) hours as needed.    . vitamin B-12 (CYANOCOBALAMIN) 100 MCG tablet Take 200 mcg by mouth every morning.     . vitamin C (ASCORBIC ACID) 500 MG tablet Take 500 mg by mouth every morning.     No current facility-administered medications for this visit.   Allergies:   Codeine; Hydrocodone; Methocarbamol; and Other   Social History:  The patient  reports that he quit smoking about 27 years ago. His smoking use included Cigarettes. He has a 45 pack-year smoking history. He has never used smokeless tobacco. He reports that he drinks about 9.6 oz of alcohol per week. He reports that he does not use illicit drugs.   Family History:  The patient's family history includes Alzheimer's disease in his mother; Heart attack in his father; Heart disease in his father; Hyperlipidemia in his father. There is no history of Colon cancer, Esophageal cancer, Rectal cancer, or Stomach cancer.   ROS:  Please see the history of present illness.   Otherwise, review of systems are positive for none.   All other systems are reviewed and negative.   PHYSICAL EXAM: VS:  BP 94/58 mmHg  Pulse 73  Ht 6' (1.829 m)  Wt 267 lb 3.2 oz (121.201 kg)  BMI 36.23 kg/m2 , BMI Body mass index is 36.23 kg/(m^2). GEN: Well nourished, well developed, in no acute distress HEENT: normal Neck: no JVD, carotid bruits, or masses Cardiac: RRR; no murmurs, rubs, or gallops,no edema  Respiratory:  clear to auscultation bilaterally, normal work of breathing GI: soft, nontender, nondistended, + BS MS: no deformity or atrophy Skin: warm and dry, no rash Neuro:  Strength and sensation are intact Psych: euthymic mood, full affect   EKG:  EKG is ordered today. The ekg ordered today  demonstrates SR, normal intervals, no pathologic Q waves   Recent Labs: No results found for requested labs within last 365 days.    Lipid Panel    Component Value Date/Time   CHOL 152 03/02/2013 1215   TRIG 128.0 03/02/2013 1215   HDL 47.50 03/02/2013 1215   CHOLHDL 3 03/02/2013 1215   VLDL 25.6 03/02/2013 1215   LDLCALC 79 03/02/2013 1215      Wt Readings from Last 3 Encounters:  07/01/14 267 lb  3.2 oz (121.201 kg)  06/16/14 267 lb (121.11 kg)  06/02/14 267 lb 9.6 oz (121.383 kg)     Other studies Reviewed: Additional studies/ records that were reviewed today include: Last cath, office visits.  ASSESSMENT AND PLAN:  1.  Palpitations: no history of irregular heart rates, higher heart rate was in the setting of possible dehydration.  2. Lightheaded feeling, Possible dehydration: several causes are possible, including lack of water intake, elevated blood sugars causing intrinsic diuresis, and exercise without water intake.   3. Borderline DM: encouraged him to limit carbs/sugars, discussed his normal intake.  Plan:  1. will check BMET today, for electrolyte levels and renal function. 2. Encourage him to drink water when exercising and increase PO intake to 6 - 8 ounce glasses per day.  3. F/u with Dr Maudie Mercury if CBG elevated.  4. If BUN/Cr elevated, may have to d/c HCTZ.  5. Advised him to call if the tachycardia happens again, he will need RN visit with orthostatic VS  Current medicines are reviewed at length with the patient today.  The patient does not have concerns regarding medicines.  The following changes have been made:  no change  Labs/ tests ordered today include:  Orders Placed This Encounter  Procedures  . Basic Metabolic Panel (BMET)  . EKG 12-Lead     Disposition:   FU with Dr. Aundra Dubin in 6 months   Signed, Rosaria Ferries, PA-C  07/01/2014 10:11 AM    [facility], [place], [place]  [address] Phone: [phone]; Fax: [phone]

## 2014-07-01 NOTE — Telephone Encounter (Signed)
BMET    Component Value Date/Time   NA 137 07/01/2014 0951   K 4.5 07/01/2014 0951   CL 103 07/01/2014 0951   CO2 27 07/01/2014 0951   GLUCOSE 111* 07/01/2014 0951   BUN 23 07/01/2014 0951   CREATININE 1.50 07/01/2014 0951   CALCIUM 8.7 07/01/2014 0951   GFRNONAA 65* 04/27/2013 0940   GFRAA 75* 04/27/2013 0940    Labs reviewed with patient. Advised him BUN/Cr were a little higher than normal, which may be from hydration.  Suggested he cut the Losartan/HCTZ in half. Continue other meds as previously instructed and call us if he continues to have symptoms.  Pt stated he would do so.  Rosaria Ferries, PA-C 07/01/2014 5:48 PM [facility] [phone]

## 2014-07-14 DIAGNOSIS — R7309 Other abnormal glucose: Secondary | ICD-10-CM | POA: Diagnosis not present

## 2014-07-14 DIAGNOSIS — I10 Essential (primary) hypertension: Secondary | ICD-10-CM | POA: Diagnosis not present

## 2014-07-19 DIAGNOSIS — R7309 Other abnormal glucose: Secondary | ICD-10-CM | POA: Diagnosis not present

## 2014-07-19 DIAGNOSIS — I10 Essential (primary) hypertension: Secondary | ICD-10-CM | POA: Diagnosis not present

## 2014-07-19 DIAGNOSIS — E780 Pure hypercholesterolemia: Secondary | ICD-10-CM | POA: Diagnosis not present

## 2014-07-27 ENCOUNTER — Other Ambulatory Visit: Payer: Self-pay | Admitting: Cardiology

## 2014-08-11 ENCOUNTER — Other Ambulatory Visit: Payer: Self-pay

## 2014-08-11 MED ORDER — ISOSORBIDE MONONITRATE ER 30 MG PO TB24: 30.0000 mg | ORAL_TABLET | Freq: Every day | ORAL | Status: DC

## 2014-08-11 MED ORDER — ATORVASTATIN CALCIUM 20 MG PO TABS: 20.0000 mg | ORAL_TABLET | Freq: Every day | ORAL | Status: DC

## 2014-08-18 DIAGNOSIS — E669 Obesity, unspecified: Secondary | ICD-10-CM | POA: Diagnosis not present

## 2014-08-18 DIAGNOSIS — R7309 Other abnormal glucose: Secondary | ICD-10-CM | POA: Diagnosis not present

## 2014-08-18 DIAGNOSIS — I10 Essential (primary) hypertension: Secondary | ICD-10-CM | POA: Diagnosis not present

## 2014-08-18 DIAGNOSIS — E780 Pure hypercholesterolemia: Secondary | ICD-10-CM | POA: Diagnosis not present

## 2014-08-25 DIAGNOSIS — M1A09X0 Idiopathic chronic gout, multiple sites, without tophus (tophi): Secondary | ICD-10-CM | POA: Diagnosis not present

## 2014-08-25 DIAGNOSIS — M150 Primary generalized (osteo)arthritis: Secondary | ICD-10-CM | POA: Diagnosis not present

## 2014-08-25 DIAGNOSIS — M5136 Other intervertebral disc degeneration, lumbar region: Secondary | ICD-10-CM | POA: Diagnosis not present

## 2014-08-30 DIAGNOSIS — M5400 Panniculitis affecting regions of neck and back, site unspecified: Secondary | ICD-10-CM | POA: Diagnosis not present

## 2014-09-04 ENCOUNTER — Other Ambulatory Visit: Payer: Self-pay | Admitting: Cardiology

## 2014-09-07 DIAGNOSIS — M545 Low back pain: Secondary | ICD-10-CM | POA: Diagnosis not present

## 2014-09-10 DIAGNOSIS — M4696 Unspecified inflammatory spondylopathy, lumbar region: Secondary | ICD-10-CM | POA: Diagnosis not present

## 2014-09-13 DIAGNOSIS — M47816 Spondylosis without myelopathy or radiculopathy, lumbar region: Secondary | ICD-10-CM | POA: Diagnosis not present

## 2014-09-15 DIAGNOSIS — M47816 Spondylosis without myelopathy or radiculopathy, lumbar region: Secondary | ICD-10-CM | POA: Diagnosis not present

## 2014-10-28 DIAGNOSIS — G4733 Obstructive sleep apnea (adult) (pediatric): Secondary | ICD-10-CM | POA: Diagnosis not present

## 2014-11-18 DIAGNOSIS — R7309 Other abnormal glucose: Secondary | ICD-10-CM | POA: Diagnosis not present

## 2014-11-18 DIAGNOSIS — I10 Essential (primary) hypertension: Secondary | ICD-10-CM | POA: Diagnosis not present

## 2014-11-23 DIAGNOSIS — R7309 Other abnormal glucose: Secondary | ICD-10-CM | POA: Diagnosis not present

## 2014-11-23 DIAGNOSIS — Z125 Encounter for screening for malignant neoplasm of prostate: Secondary | ICD-10-CM | POA: Diagnosis not present

## 2014-11-23 DIAGNOSIS — E780 Pure hypercholesterolemia: Secondary | ICD-10-CM | POA: Diagnosis not present

## 2014-11-23 DIAGNOSIS — I10 Essential (primary) hypertension: Secondary | ICD-10-CM | POA: Diagnosis not present

## 2014-11-29 ENCOUNTER — Encounter: Payer: Self-pay | Admitting: Cardiology

## 2014-11-29 ENCOUNTER — Ambulatory Visit (INDEPENDENT_AMBULATORY_CARE_PROVIDER_SITE_OTHER): Payer: Medicare Other | Admitting: Cardiology

## 2014-11-29 ENCOUNTER — Other Ambulatory Visit: Payer: Self-pay | Admitting: Cardiology

## 2014-11-29 VITALS — BP 126/64 | HR 67 | Ht 72.0 in | Wt 263.0 lb

## 2014-11-29 DIAGNOSIS — R002 Palpitations: Secondary | ICD-10-CM

## 2014-11-29 DIAGNOSIS — I10 Essential (primary) hypertension: Secondary | ICD-10-CM

## 2014-11-29 DIAGNOSIS — I2510 Atherosclerotic heart disease of native coronary artery without angina pectoris: Secondary | ICD-10-CM | POA: Diagnosis not present

## 2014-11-29 MED ORDER — ATENOLOL 50 MG PO TABS: 50.0000 mg | ORAL_TABLET | Freq: Every day | ORAL | Status: DC

## 2014-11-29 NOTE — Patient Instructions (Signed)
Medication Instructions:  No changes today  Labwork: None today  Testing/Procedures: None today  Follow-Up: Your physician wants you to follow-up in: 1 year with Dr Aundra Dubin. (August 2017).  You will receive a reminder letter in the mail two months in advance. If you don't receive a letter, please call our office to schedule the follow-up appointment.

## 2014-11-29 NOTE — Progress Notes (Signed)
Patient ID: Benjamin George, male   DOB: 15-Jan-1941, 74 y.o.   MRN: [id number] PCP: Dr. Maudie Mercury  74 yo patient with history of CAD presents for cardiology followup.  He had RCA angioplasty in 1990 and PCI to CFX in 1997. Left heart cath in 2009 showed 60% ostial RCA stenosis and patent CFX stent.  Echo in 8/12 showed normal EF with mild MR and AI.  He continues to use his CPAP for OSA.    He is stable symptomatically.  He continues to have low back pain which limits exercise. Able to ride exercise bike at the gym.  No chest pain.  No exertional dyspnea.  No daytime sleepiness and using CPAP.  ECG: NSR, 1st degree AV block  Labs (2/13): K 4.7, creatinine 1.09, HDL 42, LDL 78 Labs (3/14): K 5.1, creatinine 1.17, HDL 49, LDL 92 Labs (5/14): LDL 72, HDL 50 Labs (11/14): LDL 79, HDL 48 Labs (1/15): K 4.4, creatinine 1.1 Labs (3/16): K 4.5, creatinine 1.5 Labs (8/16): creatinine 1.22, hgb 12.8, LFTs normal, LDL 74, HDL 50, TGs 155  PMH: 1. OSA: CPAP 2. HTN 3. Obesity 4. PVCs: Holter (12/14) showed rare PVCs and PACs.  5. CAD: Inferior MI in 1990 treated with tPA then RCA angioplasty.  PCI to CFX in 1997.  LHC (3/09) with 60% ostial RCA, patent CFX stent, EF 45%.  Echo (8/12): EF 96-78%, grade I diastolic dysfunction, mild MR, mild AI.  6. Gout 7. Low back pain: spinal stenosis. Lumbar laminectomy in 1/15.  8. Left shoulder replacement 9. Colonic polyps  SH: Married, retired from [employer], stopped smoking in 1989, occasional ETOH.   FH: Premature CAD  ROS: All systems reviewed and negative except as per HPI.    Current Outpatient Prescriptions  Medication Sig Dispense Refill  . Acetaminophen 500 MG coapsule Take by mouth every 4 (four) hours as needed for fever.    Marland Kitchen allopurinol (ZYLOPRIM) 100 MG tablet Take 100 mg by mouth every morning.     Marland Kitchen aspirin 81 MG tablet Take 81 mg by mouth daily.    Marland Kitchen atorvastatin (LIPITOR) 20 MG tablet Take 1 tablet (20 mg total) by mouth daily. 30 tablet 5   . CINNAMON PO Take 1,000 mg by mouth daily.    . Coenzyme Q10 200 MG capsule Take 200 mg by mouth daily.    Marland Kitchen docusate sodium (COLACE) 100 MG capsule Take 100 mg by mouth daily as needed for mild constipation.    . isosorbide mononitrate (IMDUR) 30 MG 24 hr tablet Take 1 tablet (30 mg total) by mouth daily. 30 tablet 6  . losartan-hydrochlorothiazide (HYZAAR) 100-12.5 MG per tablet TAKE 1 TABLET BY MOUTH ONCE DAILY 90 tablet 1  . meclizine (ANTIVERT) 25 MG tablet Take 25 mg by mouth 2 (two) times daily as needed (for vertigo).    . meloxicam (MOBIC) 15 MG tablet Take 1 tablet by mouth daily as needed.    . Multiple Vitamin (MULTIVITAMIN) tablet Take 1 tablet by mouth every morning.     . traMADol (ULTRAM) 50 MG tablet Take by mouth every 6 (six) hours as needed.    . vitamin B-12 (CYANOCOBALAMIN) 100 MCG tablet Take 200 mcg by mouth every morning.     . vitamin C (ASCORBIC ACID) 500 MG tablet Take 500 mg by mouth every morning.    Marland Kitchen atenolol (TENORMIN) 50 MG tablet Take 1 tablet (50 mg total) by mouth daily. 90 tablet 3   No current facility-administered medications  for this visit.    BP 126/64 mmHg  Pulse 67  Ht 6' (1.829 m)  Wt 263 lb (119.296 kg)  BMI 35.66 kg/m2 General: NAD, obese Neck: Thick neck, no JVD, no thyromegaly or thyroid nodule.  Lungs: Clear to auscultation bilaterally with normal respiratory effort. CV: Nondisplaced PMI.  Heart regular S1/S2, no S3/S4, no murmur.  No peripheral edema.  No carotid bruit.  2+ PT pulse on left and trace PT pulse on right.  Abdomen: Soft, nontender, no hepatosplenomegaly, no distention.  Neurologic: Alert and oriented x 3.  Psych: Normal affect. Extremities: No clubbing or cyanosis.   Assessment/Plan: 1. CAD: No ischemic symptoms.  Continue ASA 81, statin, ARB, and beta blocker.  2. Hyperlipidemia:Good lipids in 8/16.   3. HTN: BP is under good control. 4. Palpitations: Not noting recently.  12/14 holter showed rare PVCs and PACs.   5. Obesity: Needs to work on diet and exercise for weight loss.  We talked about cutting back on carbohydrates and going to the gym to use the exercise bike more.   Loralie Champagne 11/29/2014

## 2014-11-30 NOTE — Addendum Note (Signed)
Addended by: Katrine Coho on: 11/30/2014 07:19 AM   Modules accepted: Orders

## 2014-12-14 ENCOUNTER — Other Ambulatory Visit: Payer: Self-pay | Admitting: Podiatry

## 2014-12-31 DIAGNOSIS — M1A09X0 Idiopathic chronic gout, multiple sites, without tophus (tophi): Secondary | ICD-10-CM | POA: Diagnosis not present

## 2014-12-31 DIAGNOSIS — M150 Primary generalized (osteo)arthritis: Secondary | ICD-10-CM | POA: Diagnosis not present

## 2014-12-31 DIAGNOSIS — M5136 Other intervertebral disc degeneration, lumbar region: Secondary | ICD-10-CM | POA: Diagnosis not present

## 2014-12-31 DIAGNOSIS — M5416 Radiculopathy, lumbar region: Secondary | ICD-10-CM | POA: Diagnosis not present

## 2015-01-26 ENCOUNTER — Other Ambulatory Visit: Payer: Self-pay | Admitting: Cardiology

## 2015-02-02 ENCOUNTER — Other Ambulatory Visit: Payer: Self-pay | Admitting: *Deleted

## 2015-02-02 MED ORDER — ISOSORBIDE MONONITRATE ER 30 MG PO TB24: 30.0000 mg | ORAL_TABLET | Freq: Every day | ORAL | Status: DC

## 2015-02-02 MED ORDER — ATORVASTATIN CALCIUM 20 MG PO TABS: 20.0000 mg | ORAL_TABLET | Freq: Every day | ORAL | Status: DC

## 2015-02-02 NOTE — Telephone Encounter (Signed)
Called and changed his atorvastatin to a 90/3 refill as well, Spoke to [place], refill fixed.

## 2015-02-03 DIAGNOSIS — M791 Myalgia: Secondary | ICD-10-CM | POA: Diagnosis not present

## 2015-02-03 DIAGNOSIS — M5136 Other intervertebral disc degeneration, lumbar region: Secondary | ICD-10-CM | POA: Diagnosis not present

## 2015-02-10 ENCOUNTER — Other Ambulatory Visit: Payer: Self-pay | Admitting: Pain Medicine

## 2015-02-10 ENCOUNTER — Other Ambulatory Visit: Payer: Self-pay | Admitting: Family Medicine

## 2015-02-10 DIAGNOSIS — M545 Low back pain: Secondary | ICD-10-CM

## 2015-02-16 ENCOUNTER — Encounter: Payer: Self-pay | Admitting: Gastroenterology

## 2015-02-17 DIAGNOSIS — M5136 Other intervertebral disc degeneration, lumbar region: Secondary | ICD-10-CM | POA: Diagnosis not present

## 2015-02-17 DIAGNOSIS — M791 Myalgia: Secondary | ICD-10-CM | POA: Diagnosis not present

## 2015-02-17 DIAGNOSIS — M545 Low back pain: Secondary | ICD-10-CM | POA: Diagnosis not present

## 2015-02-17 DIAGNOSIS — M961 Postlaminectomy syndrome, not elsewhere classified: Secondary | ICD-10-CM | POA: Diagnosis not present

## 2015-02-27 ENCOUNTER — Ambulatory Visit
Admission: RE | Admit: 2015-02-27 | Discharge: 2015-02-27 | Disposition: A | Discharge status: Home / Self Care | Payer: Medicare Other | Source: Ambulatory Visit | Attending: Pain Medicine | Admitting: Pain Medicine

## 2015-02-27 DIAGNOSIS — M545 Low back pain: Secondary | ICD-10-CM

## 2015-02-27 DIAGNOSIS — M4806 Spinal stenosis, lumbar region: Secondary | ICD-10-CM | POA: Diagnosis not present

## 2015-03-08 DIAGNOSIS — M47816 Spondylosis without myelopathy or radiculopathy, lumbar region: Secondary | ICD-10-CM | POA: Diagnosis not present

## 2015-03-08 DIAGNOSIS — M5136 Other intervertebral disc degeneration, lumbar region: Secondary | ICD-10-CM | POA: Diagnosis not present

## 2015-03-08 DIAGNOSIS — M5137 Other intervertebral disc degeneration, lumbosacral region: Secondary | ICD-10-CM | POA: Diagnosis not present

## 2015-03-17 DIAGNOSIS — M5136 Other intervertebral disc degeneration, lumbar region: Secondary | ICD-10-CM | POA: Diagnosis not present

## 2015-03-17 DIAGNOSIS — M5137 Other intervertebral disc degeneration, lumbosacral region: Secondary | ICD-10-CM | POA: Diagnosis not present

## 2015-03-23 DIAGNOSIS — M179 Osteoarthritis of knee, unspecified: Secondary | ICD-10-CM | POA: Diagnosis not present

## 2015-03-23 DIAGNOSIS — M25561 Pain in right knee: Secondary | ICD-10-CM | POA: Diagnosis not present

## 2015-04-02 ENCOUNTER — Other Ambulatory Visit: Payer: Self-pay | Admitting: Cardiology

## 2015-04-14 DIAGNOSIS — M5137 Other intervertebral disc degeneration, lumbosacral region: Secondary | ICD-10-CM | POA: Diagnosis not present

## 2015-04-14 DIAGNOSIS — M5136 Other intervertebral disc degeneration, lumbar region: Secondary | ICD-10-CM | POA: Diagnosis not present

## 2015-04-14 DIAGNOSIS — M4806 Spinal stenosis, lumbar region: Secondary | ICD-10-CM | POA: Diagnosis not present

## 2015-04-14 DIAGNOSIS — M47816 Spondylosis without myelopathy or radiculopathy, lumbar region: Secondary | ICD-10-CM | POA: Diagnosis not present

## 2015-04-15 DIAGNOSIS — M4696 Unspecified inflammatory spondylopathy, lumbar region: Secondary | ICD-10-CM | POA: Diagnosis not present

## 2015-04-28 DIAGNOSIS — G4733 Obstructive sleep apnea (adult) (pediatric): Secondary | ICD-10-CM | POA: Diagnosis not present

## 2015-05-23 DIAGNOSIS — R7309 Other abnormal glucose: Secondary | ICD-10-CM | POA: Diagnosis not present

## 2015-05-23 DIAGNOSIS — Z125 Encounter for screening for malignant neoplasm of prostate: Secondary | ICD-10-CM | POA: Diagnosis not present

## 2015-05-23 DIAGNOSIS — I10 Essential (primary) hypertension: Secondary | ICD-10-CM | POA: Diagnosis not present

## 2015-05-26 DIAGNOSIS — Z1389 Encounter for screening for other disorder: Secondary | ICD-10-CM | POA: Diagnosis not present

## 2015-05-26 DIAGNOSIS — R739 Hyperglycemia, unspecified: Secondary | ICD-10-CM | POA: Diagnosis not present

## 2015-05-26 DIAGNOSIS — E7800 Pure hypercholesterolemia, unspecified: Secondary | ICD-10-CM | POA: Diagnosis not present

## 2015-05-26 DIAGNOSIS — I10 Essential (primary) hypertension: Secondary | ICD-10-CM | POA: Diagnosis not present

## 2015-05-26 DIAGNOSIS — R972 Elevated prostate specific antigen [PSA]: Secondary | ICD-10-CM | POA: Diagnosis not present

## 2015-06-07 DIAGNOSIS — M549 Dorsalgia, unspecified: Secondary | ICD-10-CM | POA: Diagnosis not present

## 2015-06-07 DIAGNOSIS — M5136 Other intervertebral disc degeneration, lumbar region: Secondary | ICD-10-CM | POA: Diagnosis not present

## 2015-06-07 DIAGNOSIS — M4726 Other spondylosis with radiculopathy, lumbar region: Secondary | ICD-10-CM | POA: Diagnosis not present

## 2015-06-07 DIAGNOSIS — M546 Pain in thoracic spine: Secondary | ICD-10-CM | POA: Diagnosis not present

## 2015-06-07 DIAGNOSIS — M4806 Spinal stenosis, lumbar region: Secondary | ICD-10-CM | POA: Diagnosis not present

## 2015-06-08 DIAGNOSIS — S92532A Displaced fracture of distal phalanx of left lesser toe(s), initial encounter for closed fracture: Secondary | ICD-10-CM | POA: Diagnosis not present

## 2015-06-08 DIAGNOSIS — S299XXA Unspecified injury of thorax, initial encounter: Secondary | ICD-10-CM | POA: Diagnosis not present

## 2015-06-08 DIAGNOSIS — W19XXXA Unspecified fall, initial encounter: Secondary | ICD-10-CM | POA: Diagnosis not present

## 2015-06-08 DIAGNOSIS — M79672 Pain in left foot: Secondary | ICD-10-CM | POA: Diagnosis not present

## 2015-06-08 DIAGNOSIS — R0781 Pleurodynia: Secondary | ICD-10-CM | POA: Diagnosis not present

## 2015-06-14 DIAGNOSIS — M79672 Pain in left foot: Secondary | ICD-10-CM | POA: Diagnosis not present

## 2015-06-15 DIAGNOSIS — R0781 Pleurodynia: Secondary | ICD-10-CM | POA: Diagnosis not present

## 2015-06-15 DIAGNOSIS — M79676 Pain in unspecified toe(s): Secondary | ICD-10-CM | POA: Diagnosis not present

## 2015-06-20 DIAGNOSIS — Z9889 Other specified postprocedural states: Secondary | ICD-10-CM | POA: Diagnosis not present

## 2015-06-20 DIAGNOSIS — M4806 Spinal stenosis, lumbar region: Secondary | ICD-10-CM | POA: Diagnosis not present

## 2015-06-20 DIAGNOSIS — M4726 Other spondylosis with radiculopathy, lumbar region: Secondary | ICD-10-CM | POA: Diagnosis not present

## 2015-06-20 DIAGNOSIS — M5136 Other intervertebral disc degeneration, lumbar region: Secondary | ICD-10-CM | POA: Diagnosis not present

## 2015-06-23 DIAGNOSIS — R413 Other amnesia: Secondary | ICD-10-CM | POA: Diagnosis not present

## 2015-06-29 DIAGNOSIS — R739 Hyperglycemia, unspecified: Secondary | ICD-10-CM | POA: Diagnosis not present

## 2015-06-29 DIAGNOSIS — E7800 Pure hypercholesterolemia, unspecified: Secondary | ICD-10-CM | POA: Diagnosis not present

## 2015-06-29 DIAGNOSIS — I10 Essential (primary) hypertension: Secondary | ICD-10-CM | POA: Diagnosis not present

## 2015-06-29 DIAGNOSIS — R413 Other amnesia: Secondary | ICD-10-CM | POA: Diagnosis not present

## 2015-07-05 DIAGNOSIS — Z0000 Encounter for general adult medical examination without abnormal findings: Secondary | ICD-10-CM | POA: Diagnosis not present

## 2015-07-05 DIAGNOSIS — R972 Elevated prostate specific antigen [PSA]: Secondary | ICD-10-CM | POA: Diagnosis not present

## 2015-08-02 DIAGNOSIS — R972 Elevated prostate specific antigen [PSA]: Secondary | ICD-10-CM | POA: Diagnosis not present

## 2015-08-02 DIAGNOSIS — Z0000 Encounter for general adult medical examination without abnormal findings: Secondary | ICD-10-CM | POA: Diagnosis not present

## 2015-08-10 ENCOUNTER — Other Ambulatory Visit: Payer: Self-pay | Admitting: Internal Medicine

## 2015-08-10 DIAGNOSIS — I10 Essential (primary) hypertension: Secondary | ICD-10-CM | POA: Diagnosis not present

## 2015-08-10 DIAGNOSIS — R7303 Prediabetes: Secondary | ICD-10-CM | POA: Diagnosis not present

## 2015-08-10 DIAGNOSIS — E7800 Pure hypercholesterolemia, unspecified: Secondary | ICD-10-CM | POA: Diagnosis not present

## 2015-08-10 DIAGNOSIS — R413 Other amnesia: Secondary | ICD-10-CM | POA: Diagnosis not present

## 2015-08-18 ENCOUNTER — Ambulatory Visit
Admission: RE | Admit: 2015-08-18 | Discharge: 2015-08-18 | Disposition: A | Discharge status: Home / Self Care | Payer: Medicare Other | Source: Ambulatory Visit | Attending: Internal Medicine | Admitting: Internal Medicine

## 2015-08-18 DIAGNOSIS — R413 Other amnesia: Secondary | ICD-10-CM

## 2015-08-18 DIAGNOSIS — R42 Dizziness and giddiness: Secondary | ICD-10-CM | POA: Diagnosis not present

## 2015-09-02 DIAGNOSIS — R972 Elevated prostate specific antigen [PSA]: Secondary | ICD-10-CM | POA: Diagnosis not present

## 2015-09-16 DIAGNOSIS — C61 Malignant neoplasm of prostate: Secondary | ICD-10-CM | POA: Diagnosis not present

## 2015-10-24 DIAGNOSIS — G4733 Obstructive sleep apnea (adult) (pediatric): Secondary | ICD-10-CM | POA: Diagnosis not present

## 2015-11-06 ENCOUNTER — Other Ambulatory Visit: Payer: Self-pay | Admitting: Cardiology

## 2015-11-20 ENCOUNTER — Other Ambulatory Visit: Payer: Self-pay | Admitting: Cardiology

## 2015-11-20 DIAGNOSIS — R002 Palpitations: Secondary | ICD-10-CM

## 2015-11-28 ENCOUNTER — Other Ambulatory Visit: Payer: Self-pay | Admitting: Cardiology

## 2015-11-28 DIAGNOSIS — R002 Palpitations: Secondary | ICD-10-CM

## 2015-12-14 DIAGNOSIS — R7303 Prediabetes: Secondary | ICD-10-CM | POA: Diagnosis not present

## 2015-12-14 DIAGNOSIS — I10 Essential (primary) hypertension: Secondary | ICD-10-CM | POA: Diagnosis not present

## 2015-12-15 DIAGNOSIS — R413 Other amnesia: Secondary | ICD-10-CM | POA: Diagnosis not present

## 2015-12-15 DIAGNOSIS — R739 Hyperglycemia, unspecified: Secondary | ICD-10-CM | POA: Diagnosis not present

## 2015-12-15 DIAGNOSIS — E7800 Pure hypercholesterolemia, unspecified: Secondary | ICD-10-CM | POA: Diagnosis not present

## 2015-12-15 DIAGNOSIS — I10 Essential (primary) hypertension: Secondary | ICD-10-CM | POA: Diagnosis not present

## 2015-12-21 ENCOUNTER — Ambulatory Visit (INDEPENDENT_AMBULATORY_CARE_PROVIDER_SITE_OTHER): Payer: Medicare Other | Admitting: Neurology

## 2015-12-21 ENCOUNTER — Encounter: Payer: Self-pay | Admitting: Neurology

## 2015-12-21 DIAGNOSIS — R413 Other amnesia: Secondary | ICD-10-CM

## 2015-12-21 HISTORY — DX: Other amnesia: R41.3

## 2015-12-21 MED ORDER — DONEPEZIL HCL 5 MG PO TABS: 5.0000 mg | ORAL_TABLET | Freq: Every day | ORAL | 1 refills | Status: DC

## 2015-12-21 NOTE — Progress Notes (Signed)
Reason for visit: Memory disorder  Referring physician: Dr. Unice Cobble is a 75 y.o. male  History of present illness:  Benjamin George is a 75 year old right-handed white male with a history of a memory disorder that has gradually progressed over the last year. The patient has reported having some problems with remembering the names of people, word finding problems, and he has had some problems with directions with driving. He has difficulty remembering books that he has read in the past. He has general short-term memory problems. He may misplace things about the house on occasion. He has no problems keeping up with medications or appointments, he is able to pay bills and keep up with finances. He does have some occasional episodes of dizziness, but this is not a problem for him. The patient reports some low back pain, and some numbness in the legs associated with this and a sensation of some weakness in the legs. He does report a mild balance problem, he uses a cane for ambulation, he denies any recent falls. 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 The patient denies any headache or severe fatigue issues, he sleeps fairly well at night but he does have to get up 2 or 3 times at night to urinate. He is sent to this office for an evaluation.  Past Medical History:  Diagnosis Date  . Arthritis   . Atrial fibrillation ([facility])   . Cardiac arrest ([facility])   . Complication of anesthesia   . Gout   . Gout   . Heart attack ([facility])   . Heart disease   . HLD (hyperlipidemia)   . HTN (hypertension)   . PONV (postoperative nausea and vomiting)   . Sleep apnea    NPSG 10/26/07 - AHI 16.4 used5 yrs    Past Surgical History:  Procedure Laterality Date  . angioplasty    . APPENDECTOMY    . CAROTID STENT    . CORONARY ANGIOPLASTY WITH STENT PLACEMENT  06/27/2007   L main OK, LAD mild irreg, CFX stent OK, irreg,  RCA 60% calcified, EF 45%   . HAND SURGERY     left   . LUMBAR LAMINECTOMY/DECOMPRESSION MICRODISCECTOMY N/A 05/06/2013   Procedure: Lumbar 4-5 decompression    1 LEVEL;  Surgeon: Sinclair Ship, MD;  Location: [facility];  Service: Orthopedics;  Laterality: N/A;  Lumbar 4-5 decompression  . nasal septopalsty    . TOTAL SHOULDER ARTHROPLASTY  04/17/2011   Procedure: TOTAL SHOULDER ARTHROPLASTY; left Surgeon: Nita Sells, MD;  Location: [facility];  Service: Orthopedics;  Laterality: Left;    Family History  Problem Relation Age of Onset  . Heart attack Father   . Heart disease Father   . Hyperlipidemia Father   . Other Father     [name]  . Alzheimer's disease Mother   . Colon cancer Neg Hx   . Esophageal cancer Neg Hx   . Rectal cancer Neg Hx   . Stomach cancer Neg Hx     Social history:  reports that he quit smoking about 28 years ago. His smoking use included Cigarettes. He has a 45.00 pack-year smoking history. He has never used smokeless tobacco. He reports that he drinks about 9.6 oz of alcohol per week . He reports that he does not use drugs.  Medications:  Prior to Admission medications   Medication Sig  Start Date End Date Taking? Authorizing Provider  Acetaminophen 500 MG coapsule Take by mouth every 4 (four) hours as needed for fever.   Yes Historical Provider, MD  allopurinol (ZYLOPRIM) 100 MG tablet Take 100 mg by mouth every morning.    Yes Historical Provider, MD  ammonium lactate (LAC-HYDRIN) 12 % lotion APPLY TO AFFECTED AREA TWICE DAILY 12/14/14  Yes Gardiner Barefoot, DPM  aspirin 81 MG tablet Take 81 mg by mouth daily.   Yes Historical Provider, MD  atenolol (TENORMIN) 50 MG tablet TAKE 1 TABLET (50 MG TOTAL) BY MOUTH DAILY. 11/21/15  Yes Larey Dresser, MD  atorvastatin (LIPITOR) 20 MG tablet TAKE 1 TABLET (20 MG TOTAL) BY MOUTH DAILY. 11/07/15  Yes Larey Dresser, MD  CINNAMON PO Take 1,000 mg by mouth daily.    Historical Provider, MD  Coenzyme Q10 200  MG capsule Take 200 mg by mouth daily.    Historical Provider, MD  docusate sodium (COLACE) 100 MG capsule Take 100 mg by mouth daily as needed for mild constipation.    Historical Provider, MD  isosorbide mononitrate (IMDUR) 30 MG 24 hr tablet Take 1 tablet (30 mg total) by mouth daily. 02/02/15   Larey Dresser, MD  losartan-hydrochlorothiazide [facility]) 100-12.5 MG tablet TAKE 1 TABLET BY MOUTH ONCE DAILY 01/26/15   Larey Dresser, MD  meclizine (ANTIVERT) 25 MG tablet Take 25 mg by mouth 2 (two) times daily as needed (for vertigo).    Historical Provider, MD  meloxicam (MOBIC) 15 MG tablet Take 1 tablet by mouth daily as needed. 08/19/13   Historical Provider, MD  Multiple Vitamin (MULTIVITAMIN) tablet Take 1 tablet by mouth every morning.     Historical Provider, MD  traMADol (ULTRAM) 50 MG tablet Take by mouth every 6 (six) hours as needed.    Historical Provider, MD  vitamin B-12 (CYANOCOBALAMIN) 100 MCG tablet Take 200 mcg by mouth every morning.     Historical Provider, MD  vitamin C (ASCORBIC ACID) 500 MG tablet Take 500 mg by mouth every morning.    Historical Provider, MD      Allergies  Allergen Reactions  . Codeine Nausea And Vomiting  . Hydrocodone Nausea And Vomiting  . Methocarbamol Nausea Only  . Other Nausea Only    vibramyacin    ROS:  Out of a complete 14 system review of symptoms, the patient complains only of the following symptoms, and all other reviewed systems are negative.  Constipation Urination problems, impotence Joint pain Memory loss Decreased energy  Blood pressure 115/72, pulse 62, height 5\' 11"  (1.803 m), weight 257 lb (116.6 kg).  Physical Exam  General: The patient is alert and cooperative at the time of the examination. The patient is moderately to markedly obese.  Eyes: Pupils are equal, round, and reactive to light. Discs are flat bilaterally.  Neck: The neck is supple, no carotid bruits are noted.  Respiratory: The respiratory  examination is clear.  Cardiovascular: The cardiovascular examination reveals a regular rate and rhythm, no obvious murmurs or rubs are noted.  Skin: Extremities are without significant edema.  Neurologic Exam  Mental status: The patient is alert and oriented x 3 at the time of the examination. The patient has apparent normal recent and remote memory, with an apparently normal attention span and concentration ability. Mini-Mental Status Examination done today shows a total score 27/30.  Cranial nerves: Facial symmetry is present. There is good sensation of the face to pinprick and soft touch bilaterally.  The strength of the facial muscles and the muscles to head turning and shoulder shrug are normal bilaterally. Speech is well enunciated, no aphasia or dysarthria is noted. Extraocular movements are full. Visual fields are full. The tongue is midline, and the patient has symmetric elevation of the soft palate. No obvious hearing deficits are noted.  Motor: The motor testing reveals 5 over 5 strength of all 4 extremities. Good symmetric motor tone is noted throughout.  Sensory: Sensory testing is intact to pinprick, soft touch, vibration sensation, and position sense on all 4 extremities. No evidence of extinction is noted.  Coordination: Cerebellar testing reveals good finger-nose-finger and heel-to-shin bilaterally.  Gait and station: Gait is normal. Tandem gait is unsteady. Romberg is negative. No drift is seen.  Reflexes: Deep tendon reflexes are symmetric, but are depressed bilaterally. Toes are downgoing bilaterally.   CT head 08/18/15:  IMPRESSION: 1. 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 2. Suspected bilateral nasal polyps, left greater than right. Clinical correlation is advised.  * CT scan images were reviewed online. I agree with the written report.    Assessment/Plan:  1. Memory disturbance  The patient has had a CT scan of the  head that appears to show at least a moderate level small vessel disease. This may impact his memory to some degree, but currently his blood pressures are very well controlled. The patient is on low-dose aspirin. The patient will be given a trial on Aricept, he will follow-up in 6 months. He is to contact our office if he has any side effects on the medication. Currently, the dizziness is not a big issue for him.  Jill Alexanders MD 12/21/2015 3:12 PM  [facility] [address] [place], [place] [address]  Phone [phone] Fax [phone]

## 2015-12-21 NOTE — Patient Instructions (Signed)
   Begin Aricept (donepezil) at 5 mg at night for one month. If this medication is well-tolerated, please call our office and we will call in a prescription for the 10 mg tablets. Look out for side effects that may include nausea, diarrhea, weight loss, or stomach cramps. This medication will also cause a runny nose, therefore there is no need for allergy medications for this purpose.  

## 2015-12-23 ENCOUNTER — Telehealth: Payer: Self-pay | Admitting: Neurology

## 2015-12-23 DIAGNOSIS — Z23 Encounter for immunization: Secondary | ICD-10-CM | POA: Diagnosis not present

## 2015-12-23 NOTE — Telephone Encounter (Signed)
He called office complaining of GI side effect with donepezil 5 mg, nausea, barely able to tolerated.  I have advised him take donepezil 5 mg after dinner, if he continues to have side effect, may stop the medication

## 2015-12-28 MED ORDER — MEMANTINE HCL 28 X 5 MG & 21 X 10 MG PO TABS: ORAL_TABLET | ORAL | 0 refills | Status: DC

## 2015-12-28 NOTE — Addendum Note (Signed)
Addended by: Margette Fast on: 12/28/2015 03:56 PM   Modules accepted: Orders

## 2015-12-28 NOTE — Telephone Encounter (Signed)
Pt called in stating he is no longer taking donepezil any more. Please call and advise [phone]

## 2015-12-28 NOTE — Telephone Encounter (Signed)
I called patient. The patient cannot tolerate the Aricept secondary to nausea, he will stop the medication, we'll start Namenda. He will call after one month if he is tolerating medication for the maintenance dose prescription.

## 2016-01-12 ENCOUNTER — Other Ambulatory Visit: Payer: Self-pay | Admitting: Cardiology

## 2016-01-20 ENCOUNTER — Ambulatory Visit: Payer: Medicare Other | Admitting: Cardiology

## 2016-01-27 ENCOUNTER — Other Ambulatory Visit: Payer: Self-pay | Admitting: Neurology

## 2016-01-27 ENCOUNTER — Other Ambulatory Visit: Payer: Self-pay

## 2016-01-27 MED ORDER — MEMANTINE HCL 10 MG PO TABS: 10.0000 mg | ORAL_TABLET | Freq: Two times a day (BID) | ORAL | 5 refills | Status: DC

## 2016-02-02 ENCOUNTER — Other Ambulatory Visit: Payer: Self-pay | Admitting: Cardiology

## 2016-02-20 ENCOUNTER — Other Ambulatory Visit: Payer: Self-pay | Admitting: Cardiology

## 2016-02-20 DIAGNOSIS — R002 Palpitations: Secondary | ICD-10-CM

## 2016-02-22 DIAGNOSIS — C61 Malignant neoplasm of prostate: Secondary | ICD-10-CM | POA: Diagnosis not present

## 2016-02-29 DIAGNOSIS — C61 Malignant neoplasm of prostate: Secondary | ICD-10-CM | POA: Diagnosis not present

## 2016-03-16 ENCOUNTER — Encounter (INDEPENDENT_AMBULATORY_CARE_PROVIDER_SITE_OTHER): Payer: Self-pay

## 2016-03-16 ENCOUNTER — Ambulatory Visit (INDEPENDENT_AMBULATORY_CARE_PROVIDER_SITE_OTHER): Payer: Medicare Other | Admitting: Cardiology

## 2016-03-16 ENCOUNTER — Encounter: Payer: Self-pay | Admitting: Cardiology

## 2016-03-16 VITALS — BP 120/70 | HR 70 | Ht 72.0 in | Wt 262.0 lb

## 2016-03-16 DIAGNOSIS — R0989 Other specified symptoms and signs involving the circulatory and respiratory systems: Secondary | ICD-10-CM

## 2016-03-16 DIAGNOSIS — I10 Essential (primary) hypertension: Secondary | ICD-10-CM

## 2016-03-16 DIAGNOSIS — I739 Peripheral vascular disease, unspecified: Secondary | ICD-10-CM | POA: Diagnosis not present

## 2016-03-16 DIAGNOSIS — I2510 Atherosclerotic heart disease of native coronary artery without angina pectoris: Secondary | ICD-10-CM

## 2016-03-16 NOTE — Patient Instructions (Signed)
Medication Instructions:  Your physician recommends that you continue on your current medications as directed. Please refer to the Current Medication list given to you today.   Labwork: Lipid profile/BMET today  Testing/Procedures: Your physician has requested that you have a lower extremity arterial duplex. This test is an ultrasound of the arteries in the legs. It looks at arterial blood flow in the legs. Allow one hour for Lower Arterial scans. There are no restrictions or special instructions   Follow-Up: Your physician wants you to follow-up in: Dr End in 1 year. (December 2018).  You will receive a reminder letter in the mail two months in advance. If you don't receive a letter, please call our office to schedule the follow-up appointment.        If you need a refill on your cardiac medications before your next appointment, please call your pharmacy.

## 2016-03-17 LAB — LIPID PANEL
Cholesterol: 155 mg/dL (ref <200)
HDL: 45 mg/dL (ref >40)
LDL Cholesterol: 54 mg/dL (ref <100)
Total CHOL/HDL Ratio: 3.4 Ratio (ref <5.0)
Triglycerides: 279 mg/dL — ABNORMAL HIGH (ref <150)
VLDL: 56 mg/dL — ABNORMAL HIGH (ref <30)

## 2016-03-17 LAB — BASIC METABOLIC PANEL
BUN: 22 mg/dL (ref 7–25)
CO2: 25 mmol/L (ref 20–31)
Calcium: 8.9 mg/dL (ref 8.6–10.3)
Chloride: 103 mmol/L (ref 98–110)
Creat: 1.35 mg/dL — ABNORMAL HIGH (ref 0.70–1.18)
Glucose, Bld: 104 mg/dL — ABNORMAL HIGH (ref 65–99)
Potassium: 4.4 mmol/L (ref 3.5–5.3)
Sodium: 138 mmol/L (ref 135–146)

## 2016-03-18 NOTE — Progress Notes (Addendum)
Patient ID: Benjamin George, male   DOB: 17-May-1940, 75 y.o.   MRN: [id number] PCP: Dr. Maudie Mercury  75 yo patient with history of CAD presents for cardiology followup.  He had RCA angioplasty in 1990 and PCI to CFX in 1997. Left heart cath in 2009 showed 60% ostial RCA stenosis and patent CFX stent.  Echo in 8/12 showed normal EF with mild MR and AI.  He continues to use his CPAP for OSA.    He is stable symptomatically.  He continues to have low back pain which limits exercise. Able to ride recumbent bike at the gym, works with Physiological scientist twice a week.  No chest pain.  No exertional dyspnea.  No daytime sleepiness and using CPAP.  He does report bilateral thigh pain at times with walking.   ECG: NSR, 1st degree AV block  Labs (2/13): K 4.7, creatinine 1.09, HDL 42, LDL 78 Labs (3/14): K 5.1, creatinine 1.17, HDL 49, LDL 92 Labs (5/14): LDL 72, HDL 50 Labs (11/14): LDL 79, HDL 48 Labs (1/15): K 4.4, creatinine 1.1 Labs (3/16): K 4.5, creatinine 1.5 Labs (8/16): creatinine 1.22, hgb 12.8, LFTs normal, LDL 74, HDL 50, TGs 155  PMH: 1. OSA: CPAP 2. HTN 3. Obesity 4. PVCs: Holter (12/14) showed rare PVCs and PACs.  5. CAD: Inferior MI in 1990 treated with tPA then RCA angioplasty.  PCI to CFX in 1997.  LHC (3/09) with 60% ostial RCA, patent CFX stent, EF 45%.  Echo (8/12): EF [id number], grade I diastolic dysfunction, mild MR, mild AI.  6. Gout 7. Low back pain: spinal stenosis. Lumbar laminectomy in 1/15.  8. Left shoulder replacement 9. Colonic polyps  SH: Married, retired from [employer], stopped smoking in 1989, occasional ETOH.   FH: Premature CAD  ROS: All systems reviewed and negative except as per HPI.    Current Outpatient Prescriptions  Medication Sig Dispense Refill  . Acetaminophen 500 MG coapsule Take by mouth every 4 (four) hours as needed for fever.    Marland Kitchen allopurinol (ZYLOPRIM) 100 MG tablet Take 100 mg by mouth every morning.     Marland Kitchen ammonium lactate (LAC-HYDRIN) 12 % lotion  APPLY TO AFFECTED AREA TWICE DAILY 57 g 11  . aspirin 81 MG tablet Take 81 mg by mouth daily.    Marland Kitchen atenolol (TENORMIN) 50 MG tablet TAKE 1 TABLET (50 MG TOTAL) BY MOUTH DAILY. 90 tablet 0  . atorvastatin (LIPITOR) 20 MG tablet TAKE 1 TABLET (20 MG TOTAL) BY MOUTH DAILY. 90 tablet 0  . CINNAMON PO Take 1,000 mg by mouth daily.    . Coenzyme Q10 200 MG capsule Take 200 mg by mouth daily.    Marland Kitchen docusate sodium (COLACE) 100 MG capsule Take 100 mg by mouth daily as needed for mild constipation.    . isosorbide mononitrate (IMDUR) 30 MG 24 hr tablet TAKE 1 TABLET (30 MG TOTAL) BY MOUTH DAILY. 90 tablet 0  . losartan-hydrochlorothiazide (HYZAAR) 100-12.5 MG tablet TAKE 1 TABLET BY MOUTH EVERY DAY 90 tablet 0  . meclizine (ANTIVERT) 25 MG tablet Take 25 mg by mouth 2 (two) times daily as needed (for vertigo).    . meloxicam (MOBIC) 15 MG tablet Take 1 tablet by mouth daily as needed.    . memantine (NAMENDA) 10 MG tablet Take 1 tablet (10 mg total) by mouth 2 (two) times daily. 60 tablet 5  . Multiple Vitamin (MULTIVITAMIN) tablet Take 1 tablet by mouth every morning.     . traMADol (  ULTRAM) 50 MG tablet Take by mouth every 6 (six) hours as needed.    . vitamin B-12 (CYANOCOBALAMIN) 100 MCG tablet Take 200 mcg by mouth every morning.     . vitamin C (ASCORBIC ACID) 500 MG tablet Take 500 mg by mouth every morning.     No current facility-administered medications for this visit.     BP 120/70   Pulse 70   Ht 6' (1.829 m)   Wt 262 lb (118.8 kg)   BMI 35.53 kg/m  General: NAD, obese Neck: Thick neck, no JVD, no thyromegaly or thyroid nodule.  Lungs: Clear to auscultation bilaterally with normal respiratory effort. CV: Nondisplaced PMI.  Heart regular S1/S2, no S3/S4, no murmur.  No peripheral edema.  No carotid bruit.  2+ PT pulse on left, unable to palpate on right.  Abdomen: Soft, nontender, no hepatosplenomegaly, no distention.  Neurologic: Alert and oriented x 3.  Psych: Normal  affect. Extremities: No clubbing or cyanosis.   Assessment/Plan: 1. CAD: No ischemic symptoms.  Continue ASA 81, statin, ARB, and beta blocker.  2. Hyperlipidemia: Check lipids today.   3. HTN: BP is under good control. 4. Palpitations: Not noting recently.  12/14 holter showed rare PVCs and PACs.  5. Obesity: Needs to work on diet and exercise for weight loss.   6. Leg pain: Occasional thigh pain with walking.  Unable to palpate pedal pulses on right.  I will get ABIs to screen for PAD.   Given my transition to CHF clinic, he will see Dr. Saunders Revel in 1 year.   Loralie Champagne 03/18/2016

## 2016-03-23 ENCOUNTER — Other Ambulatory Visit: Payer: Self-pay | Admitting: *Deleted

## 2016-03-28 ENCOUNTER — Telehealth: Payer: Self-pay | Admitting: Internal Medicine

## 2016-03-28 DIAGNOSIS — G4733 Obstructive sleep apnea (adult) (pediatric): Secondary | ICD-10-CM

## 2016-03-28 NOTE — Telephone Encounter (Signed)
CY---pt has not been seen by you since 2012---he is needing his cpap supplies, but not able to do this until he is seen.  They offered him an appt with you the end of Jan---but pt requested to be seen before this time.  Please advise if you want to write for the supplies and have the pt keep the appt the end of the month.  Thanks  Allergies  Allergen Reactions  . Codeine Nausea And Vomiting  . Hydrocodone Nausea And Vomiting  . Aricept [Donepezil Hcl]     Nausea  . Methocarbamol Nausea Only  . Other Nausea Only    vibramyacin

## 2016-03-29 NOTE — Telephone Encounter (Signed)
Ok to refill CPAP supplies, mask of choice for dx OSA, and keep scheduled appointment

## 2016-03-29 NOTE — Telephone Encounter (Signed)
Called and spoke to pt. Informed him of the recs per CY. Appt made with CY on 05/04/16 and order placed for supplies. Pt verbalized understanding and denied any further questions or concerns at this time.

## 2016-04-06 ENCOUNTER — Other Ambulatory Visit: Payer: Self-pay | Admitting: Cardiology

## 2016-04-10 ENCOUNTER — Other Ambulatory Visit: Payer: Self-pay | Admitting: Cardiology

## 2016-04-10 DIAGNOSIS — R0989 Other specified symptoms and signs involving the circulatory and respiratory systems: Secondary | ICD-10-CM

## 2016-04-18 ENCOUNTER — Encounter (HOSPITAL_COMMUNITY): Payer: Medicare Other

## 2016-04-19 ENCOUNTER — Encounter: Payer: Self-pay | Admitting: Podiatry

## 2016-04-19 ENCOUNTER — Ambulatory Visit (INDEPENDENT_AMBULATORY_CARE_PROVIDER_SITE_OTHER): Payer: Medicare Other | Admitting: Podiatry

## 2016-04-19 VITALS — BP 129/56 | HR 64 | Resp 18 | Ht 72.0 in | Wt 262.0 lb

## 2016-04-19 DIAGNOSIS — L308 Other specified dermatitis: Secondary | ICD-10-CM | POA: Diagnosis not present

## 2016-04-19 MED ORDER — AMMONIUM LACTATE 12 % EX LOTN: TOPICAL_LOTION | CUTANEOUS | 5 refills | Status: DC | PRN

## 2016-04-19 NOTE — Progress Notes (Signed)
   Subjective:    Patient ID: Benjamin George, male    DOB: 1941-02-10, 76 y.o.   MRN: [id number]  HPI this patient presents the office with chief complaint of dry scaly skin on the heels of both feet. He states he had a similar condition years ago which was treated at [facility] with Lac-Hydrin. He says that medication worked very well and he presents the office stating he says the same problem presents the office today for an evaluation and treatment of the dry skin on the heels both feet    Review of Systems  All other systems reviewed and are negative.      Objective:   Physical Exam GENERAL APPEARANCE: Alert, conversant. Appropriately groomed. No acute distress.  VASCULAR: Pedal pulses are  palpable at  [facility] and PT bilateral.  Capillary refill time is immediate to all digits,  Normal temperature gradient.  Digital hair growth is present bilateral  NEUROLOGIC: sensation is normal to 5.07 monofilament at 5/5 sites bilateral.  Light touch is intact bilateral, Muscle strength normal.  MUSCULOSKELETAL: acceptable muscle strength, tone and stability bilateral.  Intrinsic muscluature intact bilateral.  Rectus appearance of foot and digits noted bilateral.   DERMATOLOGIC: Dry scaly heels both heels.  No preulcerative lesions or ulcers  are seen, no interdigital maceration noted.  No open lesions present.  Digital nails are asymptomatic. No drainage noted.         Assessment & Plan:  Eczema  B/L   IE  Patient is a former patient at [facility] and patient presents with the same problem. He was prescribed Lac-Hydrin for use on the skin on the heels on both feet. Return to the office when necessary   Gardiner Barefoot DPM

## 2016-04-24 ENCOUNTER — Ambulatory Visit (HOSPITAL_COMMUNITY)
Admission: RE | Admit: 2016-04-24 | Discharge: 2016-04-24 | Disposition: A | Discharge status: Home / Self Care | Payer: Medicare Other | Source: Ambulatory Visit | Attending: Cardiology | Admitting: Cardiology

## 2016-04-24 DIAGNOSIS — R0989 Other specified symptoms and signs involving the circulatory and respiratory systems: Secondary | ICD-10-CM | POA: Diagnosis not present

## 2016-04-24 DIAGNOSIS — E785 Hyperlipidemia, unspecified: Secondary | ICD-10-CM | POA: Insufficient documentation

## 2016-04-24 DIAGNOSIS — I10 Essential (primary) hypertension: Secondary | ICD-10-CM | POA: Diagnosis not present

## 2016-05-04 ENCOUNTER — Other Ambulatory Visit: Payer: Self-pay | Admitting: Cardiology

## 2016-05-04 ENCOUNTER — Ambulatory Visit (INDEPENDENT_AMBULATORY_CARE_PROVIDER_SITE_OTHER): Payer: Medicare Other | Admitting: Internal Medicine

## 2016-05-04 ENCOUNTER — Encounter: Payer: Self-pay | Admitting: Internal Medicine

## 2016-05-04 VITALS — BP 122/74 | HR 58 | Ht 71.0 in | Wt 263.8 lb

## 2016-05-04 DIAGNOSIS — G4733 Obstructive sleep apnea (adult) (pediatric): Secondary | ICD-10-CM | POA: Diagnosis not present

## 2016-05-04 NOTE — Assessment & Plan Note (Signed)
He has been very compliant with CPAP and seems to have experienced relief of snoring and improved sleep quality. Sleep is still disturbed by nocturia. He needs replacement mask and supplies. His machine is over 77 years old and we will ask his homecare company to check on replacement status. Plan-[facility] who continues CPAP 10 and evaluate eligibility for replacement of old machine, replacing supplies. Weight loss is encouraged.

## 2016-05-04 NOTE — Assessment & Plan Note (Signed)
He realizes he is overweight. Weight loss is encouraged and would benefit several of his medical problems. Bariatric counseling available if appropriate.

## 2016-05-04 NOTE — Patient Instructions (Signed)
Order- [facility]- we can continue CPAP 10, mask of choice, humidifier, supplies, AirView   Dx OSA                      He needs new supplies and mask please                      Please evaluate eligibility for replacement of old machine  Please call if we can help

## 2016-05-04 NOTE — Progress Notes (Signed)
05/04/2016-76 year old male former smoker coming to reestablish for management of OSA. Medical problems include allergic rhinitis, CAD/ MI, morbid obesity, insomnia NPSG-10/16/2007-[facility] 16.4/hour, desaturation to 83%. Periodic limb movement with arousal 8.2/hour. CPAP 10/ [facility] He needed to reestablish contact so that he could get supplies updated from [facility] He states he cannot sleep without CPAP. Current machine is over 3 years old.. Full face mask is worn out and won't seal.  He denies daytime sleepiness or snoring. Nocturia 3 but otherwise he says he falls asleep easily and feels sufficiently rested in the daytime. No sleep medicines. Smoked 2 packs per day for many years but denies routine cough or wheeze and has not been told he has chronic lung disease.  Prior to Admission medications   Medication Sig Start Date End Date Taking? Authorizing Provider  Acetaminophen 500 MG coapsule Take by mouth every 4 (four) hours as needed for fever.   Yes Historical Provider, MD  allopurinol (ZYLOPRIM) 100 MG tablet Take 100 mg by mouth every morning.    Yes Historical Provider, MD  ammonium lactate (LAC-HYDRIN) 12 % lotion Apply topically as needed for dry skin. 04/19/16  Yes Gardiner Barefoot, DPM  aspirin 81 MG tablet Take 81 mg by mouth daily.   Yes Historical Provider, MD  atenolol (TENORMIN) 50 MG tablet TAKE 1 TABLET (50 MG TOTAL) BY MOUTH DAILY. 02/21/16  Yes Larey Dresser, MD  CINNAMON PO Take 1,000 mg by mouth daily.   Yes Historical Provider, MD  Coenzyme Q10 200 MG capsule Take 200 mg by mouth daily.   Yes Historical Provider, MD  docusate sodium (COLACE) 100 MG capsule Take 100 mg by mouth daily as needed for mild constipation.   Yes Historical Provider, MD  isosorbide mononitrate (IMDUR) 30 MG 24 hr tablet TAKE 1 TABLET (30 MG TOTAL) BY MOUTH DAILY. 04/06/16  Yes Larey Dresser, MD  losartan-hydrochlorothiazide [facility]) 100-12.5 MG tablet TAKE 1 TABLET BY MOUTH EVERY DAY 02/21/16  Yes  Larey Dresser, MD  meclizine (ANTIVERT) 25 MG tablet Take 25 mg by mouth 2 (two) times daily as needed (for vertigo).   Yes Historical Provider, MD  meloxicam (MOBIC) 15 MG tablet Take 1 tablet by mouth daily as needed. 08/19/13  Yes Historical Provider, MD  memantine (NAMENDA) 10 MG tablet Take 1 tablet (10 mg total) by mouth 2 (two) times daily. 01/27/16  Yes Kathrynn Ducking, MD  Multiple Vitamin (MULTIVITAMIN) tablet Take 1 tablet by mouth every morning.    Yes Historical Provider, MD  traMADol (ULTRAM) 50 MG tablet Take by mouth every 6 (six) hours as needed.   Yes Historical Provider, MD  vitamin B-12 (CYANOCOBALAMIN) 100 MCG tablet Take 200 mcg by mouth every morning.    Yes Historical Provider, MD  vitamin C (ASCORBIC ACID) 500 MG tablet Take 500 mg by mouth every morning.   Yes Historical Provider, MD  atorvastatin (LIPITOR) 20 MG tablet TAKE 1 TABLET (20 MG TOTAL) BY MOUTH DAILY. 05/04/16   Larey Dresser, MD   Past Medical History:  Diagnosis Date  . Arthritis   . Atrial fibrillation ([place])   . Cardiac arrest ([place])   . Complication of anesthesia   . Gout   . Gout   . Heart attack   . Heart disease   . HLD (hyperlipidemia)   . HTN (hypertension)   . Memory disorder 12/21/2015  . PONV (postoperative nausea and vomiting)   . Sleep apnea    NPSG 10/26/07 - [facility] 16.4  used5 yrs   Past Surgical History:  Procedure Laterality Date  . angioplasty    . APPENDECTOMY    . CAROTID STENT    . CORONARY ANGIOPLASTY WITH STENT PLACEMENT  06/27/2007   L main OK, LAD mild irreg, CFX stent OK, irreg, RCA 60% calcified, EF 45%   . HAND SURGERY     left   . LUMBAR LAMINECTOMY/DECOMPRESSION MICRODISCECTOMY N/A 05/06/2013   Procedure: Lumbar 4-5 decompression    1 LEVEL;  Surgeon: Sinclair Ship, MD;  Location: [place];  Service: Orthopedics;  Laterality: N/A;  Lumbar 4-5 decompression  . nasal septopalsty    . TOTAL SHOULDER ARTHROPLASTY  04/17/2011   Procedure: TOTAL SHOULDER  ARTHROPLASTY; left Surgeon: Nita Sells, MD;  Location: [place];  Service: Orthopedics;  Laterality: Left;   Family History  Problem Relation Age of Onset  . Heart attack Father   . Heart disease Father   . Hyperlipidemia Father   . Other Father     [name]  . Alzheimer's disease Mother   . Colon cancer Neg Hx   . Esophageal cancer Neg Hx   . Rectal cancer Neg Hx   . Stomach cancer Neg Hx    Social History   Social History  . Marital status: Married    Spouse name: N/A  . Number of children: 2  . Years of education: College/tech   Occupational History  . Retired    Social History Main Topics  . Smoking status: Former Smoker    Packs/day: 1.50    Years: 30.00    Types: Cigarettes    Quit date: 04/28/1987  . Smokeless tobacco: Never Used     Comment: smoked 2 ppd for 32 years; quit in 1989  . Alcohol use 9.6 oz/week    14 Cans of beer, 2 Shots of liquor per week     Comment: [place] of beers per day  . Drug use: No  . Sexual activity: Not on file     Comment: Married   Other Topics Concern  . Not on file   Social History Narrative   Married, 2 children.    Retired VP at [employer] HPI   Negative unless "+" Constitutional:    weight loss, night sweats, fevers, chills, fatigue, lassitude. HEENT:    headaches, difficulty swallowing, tooth/dental problems, sore throat,       sneezing, itching, ear ache, nasal congestion, post nasal drip, snoring CV:    chest pain, orthopnea, PND, swelling in lower extremities, anasarca,                                                          dizziness, palpitations Resp:   shortness of breath with exertion or at rest.                productive cough,   non-productive cough, coughing up of blood.              change in color of mucus.  wheezing.   Skin:    rash or lesions. GI:  No-   heartburn, indigestion, abdominal pain, nausea, vomiting, diarrhea,                 change in bowel habits, loss of  appetite GU: dysuria, change  in color of urine, no urgency or frequency.   flank pain. MS:   joint pain, stiffness, decreased range of motion, +back pain. Neuro-     nothing unusual Psych:  change in mood or affect.  depression or anxiety.   memory loss.  OBJ- Physical Exam General- Alert, Oriented, Affect-appropriate, Distress- none acute, + Obese Skin- rash-none, lesions- none, excoriation- none Lymphadenopathy- none Head- atraumatic            Eyes- Gross vision intact, PERRLA, conjunctivae and secretions clear            Ears- Hearing, canals-normal            Nose- Clear, no-Septal dev, mucus, polyps, erosion, perforation             Throat- Mallampati IV , mucosa clear , drainage- none, tonsils- atrophic Neck- flexible , trachea midline, no stridor , thyroid nl, carotid no bruit Chest - symmetrical excursion , unlabored           Heart/CV- RRR , no murmur , no gallop  , no rub, nl s1 s2                           - JVD- none , edema- none, stasis changes- none, varices- none           Lung- + a little coarse in the bases, wheeze- none, cough- none , dullness-none, rub- none           Chest wall-  Abd-  Br/ Gen/ Rectal- Not done, not indicated Extrem- cyanosis- none, clubbing, none, atrophy- none, strength- nl Neuro- grossly intact to observation

## 2016-05-28 ENCOUNTER — Other Ambulatory Visit: Payer: Self-pay | Admitting: Cardiology

## 2016-05-28 DIAGNOSIS — R002 Palpitations: Secondary | ICD-10-CM

## 2016-06-11 DIAGNOSIS — R739 Hyperglycemia, unspecified: Secondary | ICD-10-CM | POA: Diagnosis not present

## 2016-06-11 DIAGNOSIS — I10 Essential (primary) hypertension: Secondary | ICD-10-CM | POA: Diagnosis not present

## 2016-06-11 DIAGNOSIS — Z125 Encounter for screening for malignant neoplasm of prostate: Secondary | ICD-10-CM | POA: Diagnosis not present

## 2016-06-14 DIAGNOSIS — I10 Essential (primary) hypertension: Secondary | ICD-10-CM | POA: Diagnosis not present

## 2016-06-14 DIAGNOSIS — Z0000 Encounter for general adult medical examination without abnormal findings: Secondary | ICD-10-CM | POA: Diagnosis not present

## 2016-06-14 DIAGNOSIS — Z23 Encounter for immunization: Secondary | ICD-10-CM | POA: Diagnosis not present

## 2016-06-14 DIAGNOSIS — M1A9XX0 Chronic gout, unspecified, without tophus (tophi): Secondary | ICD-10-CM | POA: Diagnosis not present

## 2016-06-14 DIAGNOSIS — E7800 Pure hypercholesterolemia, unspecified: Secondary | ICD-10-CM | POA: Diagnosis not present

## 2016-06-15 DIAGNOSIS — Z5181 Encounter for therapeutic drug level monitoring: Secondary | ICD-10-CM | POA: Diagnosis not present

## 2016-06-16 ENCOUNTER — Other Ambulatory Visit: Payer: Self-pay | Admitting: Cardiology

## 2016-06-20 ENCOUNTER — Encounter: Payer: Self-pay | Admitting: Adult Health

## 2016-06-20 ENCOUNTER — Encounter (INDEPENDENT_AMBULATORY_CARE_PROVIDER_SITE_OTHER): Payer: Self-pay

## 2016-06-20 ENCOUNTER — Ambulatory Visit (INDEPENDENT_AMBULATORY_CARE_PROVIDER_SITE_OTHER): Payer: Medicare Other | Admitting: Adult Health

## 2016-06-20 VITALS — BP 126/64 | HR 69 | Ht 71.0 in | Wt 262.5 lb

## 2016-06-20 DIAGNOSIS — R413 Other amnesia: Secondary | ICD-10-CM

## 2016-06-20 MED ORDER — MEMANTINE HCL 10 MG PO TABS: 10.0000 mg | ORAL_TABLET | Freq: Two times a day (BID) | ORAL | 11 refills | Status: DC

## 2016-06-20 NOTE — Progress Notes (Signed)
PATIENT: Benjamin George DOB: October 25, 1940  REASON FOR VISIT: follow up- memory HISTORY FROM: patient  HISTORY OF PRESENT ILLNESS: Benjamin George is a 76 year old male with a history of memory disturbance. He returns today for an evaluation. He states that he is tolerating Namenda well. He was at home with his wife. He is able to complete all ADLs independently. He does operate a Teacher, music. He does notice that he gets lost easier therefore he's been using his GPS more often. Both him and his wife completes their finances. He does help prepare meals without difficulty. Sleeping okay. He does have sleep apnea and wears CPAP. Denies having to give up any hobbies due to his memory. Denies hallucinations. Denies any changes with his mood or behavior. He returns today for an evaluation.  HISTORY 12/21/15: Benjamin George is a 76 year old right-handed white male with a history of a memory disorder that has gradually progressed over the last year. The patient has reported having some problems with remembering the names of people, word finding problems, and he has had some problems with directions with driving. He has difficulty remembering books that he has read in the past. He has general short-term memory problems. He may misplace things about the house on occasion. He has no problems keeping up with medications or appointments, he is able to pay bills and keep up with finances. He does have some occasional episodes of dizziness, but this is not a problem for him. The patient reports some low back pain, and some numbness in the legs associated with this and a sensation of some weakness in the legs. He does report a mild balance problem, he uses a cane for ambulation, he denies any recent falls. 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 The patient denies any headache or severe fatigue issues, he sleeps fairly well at night but he  does have to get up 2 or 3 times at night to urinate. He is sent to this office for an evaluation.  REVIEW OF SYSTEMS: Out of a complete 14 system review of symptoms, the patient complains only of the following symptoms, and all other reviewed systems are negative.  Chest pain, constipation, apnea, frequent waking, urine frequency of urination, back pain, aching muscles, walking difficulty, moles, memory loss  ALLERGIES: Allergies  Allergen Reactions  . Codeine Nausea And Vomiting  . Hydrocodone Nausea And Vomiting  . Aricept [Donepezil Hcl]     Nausea  . Methocarbamol Nausea Only  . Other Nausea Only    vibramyacin    HOME MEDICATIONS: Outpatient Medications Prior to Visit  Medication Sig Dispense Refill  . Acetaminophen 500 MG coapsule Take by mouth every 4 (four) hours as needed for fever.    Marland Kitchen allopurinol (ZYLOPRIM) 100 MG tablet Take 100 mg by mouth every morning.     Marland Kitchen ammonium lactate (LAC-HYDRIN) 12 % lotion Apply topically as needed for dry skin. 57 g 5  . aspirin 81 MG tablet Take 81 mg by mouth daily.    Marland Kitchen atenolol (TENORMIN) 50 MG tablet TAKE 1 TABLET (50 MG TOTAL) BY MOUTH DAILY. 90 tablet 2  . atorvastatin (LIPITOR) 20 MG tablet TAKE 1 TABLET (20 MG TOTAL) BY MOUTH DAILY. 90 tablet 3  . CINNAMON PO Take 1,000 mg by mouth daily.    . Coenzyme Q10 200 MG capsule Take 200 mg by mouth daily.    Marland Kitchen  docusate sodium (COLACE) 100 MG capsule Take 100 mg by mouth daily as needed for mild constipation.    . isosorbide mononitrate (IMDUR) 30 MG 24 hr tablet TAKE 1 TABLET (30 MG TOTAL) BY MOUTH DAILY. 90 tablet 3  . losartan-hydrochlorothiazide (HYZAAR) 100-12.5 MG tablet TAKE 1 TABLET BY MOUTH EVERY DAY 90 tablet 2  . meclizine (ANTIVERT) 25 MG tablet Take 25 mg by mouth 2 (two) times daily as needed (for vertigo).    . meloxicam (MOBIC) 15 MG tablet Take 1 tablet by mouth daily as needed.    . memantine (NAMENDA) 10 MG tablet Take 1 tablet (10 mg total) by mouth 2 (two) times  daily. 60 tablet 5  . Multiple Vitamin (MULTIVITAMIN) tablet Take 1 tablet by mouth every morning.     . traMADol (ULTRAM) 50 MG tablet Take by mouth every 6 (six) hours as needed.    . vitamin B-12 (CYANOCOBALAMIN) 100 MCG tablet Take 200 mcg by mouth every morning.     . vitamin C (ASCORBIC ACID) 500 MG tablet Take 500 mg by mouth every morning.     No facility-administered medications prior to visit.     PAST MEDICAL HISTORY: Past Medical History:  Diagnosis Date  . Arthritis   . Atrial fibrillation ([place])   . Cardiac arrest ([place])   . Complication of anesthesia   . Gout   . Gout   . Heart attack   . Heart disease   . HLD (hyperlipidemia)   . HTN (hypertension)   . Memory disorder 12/21/2015  . PONV (postoperative nausea and vomiting)   . Sleep apnea    NPSG 10/26/07 - AHI 16.4 used5 yrs    PAST SURGICAL HISTORY: Past Surgical History:  Procedure Laterality Date  . angioplasty    . APPENDECTOMY    . CAROTID STENT    . CORONARY ANGIOPLASTY WITH STENT PLACEMENT  06/27/2007   L main OK, LAD mild irreg, CFX stent OK, irreg, RCA 60% calcified, EF 45%   . HAND SURGERY     left   . LUMBAR LAMINECTOMY/DECOMPRESSION MICRODISCECTOMY N/A 05/06/2013   Procedure: Lumbar 4-5 decompression    1 LEVEL;  Surgeon: Sinclair Ship, MD;  Location: [facility];  Service: Orthopedics;  Laterality: N/A;  Lumbar 4-5 decompression  . nasal septopalsty    . TOTAL SHOULDER ARTHROPLASTY  04/17/2011   Procedure: TOTAL SHOULDER ARTHROPLASTY; left Surgeon: Nita Sells, MD;  Location: [facility];  Service: Orthopedics;  Laterality: Left;    FAMILY HISTORY: Family History  Problem Relation Age of Onset  . Heart attack Father   . Heart disease Father   . Hyperlipidemia Father   . Other Father     [name]  . Alzheimer's disease Mother   . Colon cancer Neg Hx   . Esophageal cancer Neg Hx   . Rectal cancer Neg Hx   . Stomach cancer Neg Hx     SOCIAL HISTORY: Social History    Social History  . Marital status: Married    Spouse name: N/A  . Number of children: 2  . Years of education: College/tech   Occupational History  . Retired    Social History Main Topics  . Smoking status: Former Smoker    Packs/day: 1.50    Years: 30.00    Types: Cigarettes    Quit date: 04/28/1987  . Smokeless tobacco: Never Used     Comment: smoked 2 ppd for 32 years; quit in 1989  . Alcohol  use 9.6 oz/week    14 Cans of beer, 2 Shots of liquor per week     Comment: Coulpe of beers per day  . Drug use: No  . Sexual activity: Not on file     Comment: Married   Other Topics Concern  . Not on file   Social History Narrative   Married, 2 children.    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   MMSE - Mini Mental State Exam 12/21/2015  Orientation to time 3  Orientation to Place 4  Registration 3  Attention/ Calculation 5  Recall 2  Language- name 2 objects 2  Language- repeat 1  Language- follow 3 step command 3  Language- read & follow direction 1  Write a sentence 1  Copy design 1  Total score 26    Generalized: Well developed, in no acute distress   Neurological examination  Mentation: Alert oriented to time, place, history taking. Follows all commands speech and language fluent Cranial nerve II-XII: Pupils were equal round reactive to light. Extraocular movements were full, visual field were full on confrontational test. Facial sensation and strength were normal. Uvula tongue midline. Head turning and shoulder shrug  were normal and symmetric. Motor: The motor testing reveals 5 over 5 strength of all 4 extremities. Good symmetric motor tone is noted throughout.  Sensory: Sensory testing is intact to soft touch on all 4 extremities. No evidence of extinction is noted.  Coordination: Cerebellar testing  reveals good finger-nose-finger and heel-to-shin bilaterally.  Gait and station: Gait is normal.  Romberg is negative. No drift is seen.  Reflexes: Deep tendon reflexes are symmetric and normal bilaterally.   DIAGNOSTIC DATA (LABS, IMAGING, TESTING) - I reviewed patient records, labs, notes, testing and imaging myself where available.  Lab Results  Component Value Date   WBC 7.9 04/27/2013   HGB 13.7 04/27/2013   HCT 39.3 04/27/2013   MCV 92.9 04/27/2013   PLT 222 04/27/2013      Component Value Date/Time   NA 138 03/16/2016 1653   K 4.4 03/16/2016 1653   CL 103 03/16/2016 1653   CO2 25 03/16/2016 1653   GLUCOSE 104 (H) 03/16/2016 1653   BUN 22 03/16/2016 1653   CREATININE 1.35 (H) 03/16/2016 1653   CALCIUM 8.9 03/16/2016 1653   PROT 7.0 04/27/2013 0940   ALBUMIN 3.8 04/27/2013 0940   AST 18 04/27/2013 0940   ALT 20 04/27/2013 0940   ALKPHOS 60 04/27/2013 0940   BILITOT 0.4 04/27/2013 0940   GFRNONAA 65 (L) 04/27/2013 0940   GFRAA 75 (L) 04/27/2013 0940   Lab Results  Component Value Date   CHOL 155 03/16/2016   HDL 45 03/16/2016   LDLCALC 54 03/16/2016   TRIG 279 (H) 03/16/2016   CHOLHDL 3.4 03/16/2016      ASSESSMENT AND PLAN 76 y.o. year old male  has a past medical history of Arthritis; Atrial fibrillation ([place]); Cardiac arrest ([place]); Complication of anesthesia; Gout; Gout; Heart attack; Heart disease; HLD (hyperlipidemia); HTN (hypertension); Memory disorder (12/21/2015); PONV (postoperative nausea and vomiting); and Sleep apnea. here with:  1. Memory disturbance  Overall the patient's memory score has remained stable. He will continue on Namenda 10 mg twice a day. Advised that if his symptoms worsen  or he develops new symptoms he should let us know. He will follow-up in 6 months or sooner if needed.  I spent 15 minutes with the patient 50% of this time was spent reviewing the patient's diagnosis and treatment.   Ward Givens, MSN, NP-C 06/20/2016, 3:39  PM [facility] [address], [place], [place] [address] [phone]

## 2016-06-20 NOTE — Patient Instructions (Signed)
Continue Namenda Memory is stable If your symptoms worsen or you develop new symptoms please let us know.

## 2016-06-20 NOTE — Progress Notes (Signed)
I have read the note, and I agree with the clinical assessment and plan.  Markiesha Delia KEITH   

## 2016-06-21 DIAGNOSIS — G4733 Obstructive sleep apnea (adult) (pediatric): Secondary | ICD-10-CM | POA: Diagnosis not present

## 2016-06-25 DIAGNOSIS — G4733 Obstructive sleep apnea (adult) (pediatric): Secondary | ICD-10-CM | POA: Diagnosis not present

## 2016-06-25 DIAGNOSIS — I10 Essential (primary) hypertension: Secondary | ICD-10-CM | POA: Diagnosis not present

## 2016-08-22 DIAGNOSIS — R413 Other amnesia: Secondary | ICD-10-CM | POA: Diagnosis not present

## 2016-08-22 DIAGNOSIS — T402X5A Adverse effect of other opioids, initial encounter: Secondary | ICD-10-CM | POA: Diagnosis not present

## 2016-08-22 DIAGNOSIS — M5440 Lumbago with sciatica, unspecified side: Secondary | ICD-10-CM | POA: Diagnosis not present

## 2016-08-22 DIAGNOSIS — G8929 Other chronic pain: Secondary | ICD-10-CM | POA: Diagnosis not present

## 2016-08-29 DIAGNOSIS — R413 Other amnesia: Secondary | ICD-10-CM | POA: Diagnosis not present

## 2016-08-31 DIAGNOSIS — C61 Malignant neoplasm of prostate: Secondary | ICD-10-CM | POA: Diagnosis not present

## 2016-09-05 DIAGNOSIS — M5136 Other intervertebral disc degeneration, lumbar region: Secondary | ICD-10-CM | POA: Diagnosis not present

## 2016-09-05 DIAGNOSIS — Z9889 Other specified postprocedural states: Secondary | ICD-10-CM | POA: Diagnosis not present

## 2016-09-05 DIAGNOSIS — M48062 Spinal stenosis, lumbar region with neurogenic claudication: Secondary | ICD-10-CM | POA: Diagnosis not present

## 2016-09-05 DIAGNOSIS — I10 Essential (primary) hypertension: Secondary | ICD-10-CM | POA: Diagnosis not present

## 2016-09-05 DIAGNOSIS — M4726 Other spondylosis with radiculopathy, lumbar region: Secondary | ICD-10-CM | POA: Diagnosis not present

## 2016-09-06 DIAGNOSIS — R413 Other amnesia: Secondary | ICD-10-CM | POA: Diagnosis not present

## 2016-09-07 DIAGNOSIS — C61 Malignant neoplasm of prostate: Secondary | ICD-10-CM | POA: Diagnosis not present

## 2016-09-12 DIAGNOSIS — R413 Other amnesia: Secondary | ICD-10-CM | POA: Diagnosis not present

## 2016-09-19 DIAGNOSIS — K5903 Drug induced constipation: Secondary | ICD-10-CM | POA: Diagnosis not present

## 2016-09-19 DIAGNOSIS — R413 Other amnesia: Secondary | ICD-10-CM | POA: Diagnosis not present

## 2016-09-19 DIAGNOSIS — Z79899 Other long term (current) drug therapy: Secondary | ICD-10-CM | POA: Diagnosis not present

## 2016-09-19 DIAGNOSIS — Z5181 Encounter for therapeutic drug level monitoring: Secondary | ICD-10-CM | POA: Diagnosis not present

## 2016-09-19 DIAGNOSIS — I10 Essential (primary) hypertension: Secondary | ICD-10-CM | POA: Diagnosis not present

## 2016-10-09 DIAGNOSIS — R413 Other amnesia: Secondary | ICD-10-CM | POA: Diagnosis not present

## 2016-10-16 DIAGNOSIS — R413 Other amnesia: Secondary | ICD-10-CM | POA: Diagnosis not present

## 2016-10-23 DIAGNOSIS — R413 Other amnesia: Secondary | ICD-10-CM | POA: Diagnosis not present

## 2016-10-25 DIAGNOSIS — R413 Other amnesia: Secondary | ICD-10-CM | POA: Diagnosis not present

## 2016-10-25 DIAGNOSIS — E7800 Pure hypercholesterolemia, unspecified: Secondary | ICD-10-CM | POA: Diagnosis not present

## 2016-10-25 DIAGNOSIS — K5900 Constipation, unspecified: Secondary | ICD-10-CM | POA: Diagnosis not present

## 2016-10-25 DIAGNOSIS — I10 Essential (primary) hypertension: Secondary | ICD-10-CM | POA: Diagnosis not present

## 2016-11-06 DIAGNOSIS — R413 Other amnesia: Secondary | ICD-10-CM | POA: Diagnosis not present

## 2016-11-13 DIAGNOSIS — S60212A Contusion of left wrist, initial encounter: Secondary | ICD-10-CM | POA: Diagnosis not present

## 2016-11-16 DIAGNOSIS — M545 Low back pain: Secondary | ICD-10-CM | POA: Diagnosis not present

## 2016-11-21 DIAGNOSIS — M545 Low back pain: Secondary | ICD-10-CM | POA: Diagnosis not present

## 2016-11-21 DIAGNOSIS — M5442 Lumbago with sciatica, left side: Secondary | ICD-10-CM | POA: Diagnosis not present

## 2016-12-11 DIAGNOSIS — R739 Hyperglycemia, unspecified: Secondary | ICD-10-CM | POA: Diagnosis not present

## 2016-12-11 DIAGNOSIS — D649 Anemia, unspecified: Secondary | ICD-10-CM | POA: Diagnosis not present

## 2016-12-11 DIAGNOSIS — R413 Other amnesia: Secondary | ICD-10-CM | POA: Diagnosis not present

## 2016-12-11 DIAGNOSIS — I10 Essential (primary) hypertension: Secondary | ICD-10-CM | POA: Diagnosis not present

## 2016-12-11 DIAGNOSIS — Z13820 Encounter for screening for osteoporosis: Secondary | ICD-10-CM | POA: Diagnosis not present

## 2016-12-11 DIAGNOSIS — Z5181 Encounter for therapeutic drug level monitoring: Secondary | ICD-10-CM | POA: Diagnosis not present

## 2016-12-17 DIAGNOSIS — M5441 Lumbago with sciatica, right side: Secondary | ICD-10-CM | POA: Diagnosis not present

## 2016-12-17 DIAGNOSIS — G8929 Other chronic pain: Secondary | ICD-10-CM | POA: Diagnosis not present

## 2016-12-17 DIAGNOSIS — M5442 Lumbago with sciatica, left side: Secondary | ICD-10-CM | POA: Diagnosis not present

## 2016-12-17 DIAGNOSIS — I10 Essential (primary) hypertension: Secondary | ICD-10-CM | POA: Diagnosis not present

## 2016-12-24 ENCOUNTER — Ambulatory Visit (INDEPENDENT_AMBULATORY_CARE_PROVIDER_SITE_OTHER): Payer: Medicare Other | Admitting: Adult Health

## 2016-12-24 ENCOUNTER — Encounter: Payer: Self-pay | Admitting: Adult Health

## 2016-12-24 VITALS — BP 122/68 | HR 63 | Wt 255.0 lb

## 2016-12-24 DIAGNOSIS — G4733 Obstructive sleep apnea (adult) (pediatric): Secondary | ICD-10-CM | POA: Diagnosis not present

## 2016-12-24 DIAGNOSIS — R413 Other amnesia: Secondary | ICD-10-CM | POA: Diagnosis not present

## 2016-12-24 NOTE — Progress Notes (Signed)
I have read the note, and I agree with the clinical assessment and plan.  WILLIS,CHARLES KEITH   

## 2016-12-24 NOTE — Progress Notes (Signed)
PATIENT: Benjamin George DOB: 08/25/1940  REASON FOR VISIT: follow up- memory disturbance HISTORY FROM: patient  HISTORY OF PRESENT ILLNESS: Today 12/24/16 Mr. Mchaney is a 76 year old male with a history of memory disturbance. He returns today for follow-up. He is currently on Namenda and tolerating it well. He is able to complete all ADLs independently. He operates a Teacher, music without difficulty. Continues to use his GPS. He still helps with the finances. Denies any trouble sleeping other than he wakes 4 times a night to urinate. Denies any changes in his mood or behavior. Denies agitation and aggressiveness. Denies hallucinations. He returns today for an evaluation.  HISTORY 06/20/16: Mr. Ellena is a 76 year old male with a history of memory disturbance. He returns today for an evaluation. He states that he is tolerating Namenda well. He was at home with his wife. He is able to complete all ADLs independently. He does operate a Teacher, music. He does notice that he gets lost easier therefore he's been using his GPS more often. Both him and his wife completes their finances. He does help prepare meals without difficulty. Sleeping okay. He does have sleep apnea and wears CPAP. Denies having to give up any hobbies due to his memory. Denies hallucinations. Denies any changes with his mood or behavior. He returns today for an evaluation.  HISTORY 12/21/15: Mr. Cordrey a 76 year old right-handed white male with a history of a memory disorder that has gradually progressed over the last year. The patient has reported having some problemswith remembering the names ofpeople, word finding problems, and he has had some problems with directions with driving. He has difficulty remembering books that he has read in the past. He has general short-term memory problems. Hemay misplace things about the house on occasion. He has no problems keeping up with medications or appointments, he is able to pay  bills and keep up with finances. He does have some occasional episodes of dizziness, but this is not a problem for him. The patient reports some low back pain, andsome numbness in the legs associated with this and a sensation of some weakness in the legs. He does report a mild balance problem, he uses a cane for ambulation, he denies any recent falls. 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 The patient denies any headache or severe fatigue issues, he sleeps fairly well at night but he does have to get up 2 or 3 times at nightto urinate. He is sent to this office for an evaluation.  REVIEW OF SYSTEMS: Out of a complete 14 system review of symptoms, the patient complains only of the following symptoms, and all other reviewed systems are negative.  Frequency of urination, memory loss, constipation, apnea, back pain, walking difficulty  ALLERGIES: Allergies  Allergen Reactions  . Codeine Nausea And Vomiting  . Hydrocodone Nausea And Vomiting  . Aricept [Donepezil Hcl]     Nausea  . Methocarbamol Nausea Only  . Other Nausea Only    vibramyacin    HOME MEDICATIONS: Outpatient Medications Prior to Visit  Medication Sig Dispense Refill  . Acetaminophen 500 MG coapsule Take by mouth every 4 (four) hours as needed for fever.    Marland Kitchen allopurinol (ZYLOPRIM) 100 MG tablet Take 100 mg by mouth every morning.     Marland Kitchen ammonium lactate (LAC-HYDRIN) 12 % lotion Apply topically as needed for dry skin. 57 g 5  .  aspirin 81 MG tablet Take 81 mg by mouth daily.    Marland Kitchen atenolol (TENORMIN) 50 MG tablet TAKE 1 TABLET (50 MG TOTAL) BY MOUTH DAILY. 90 tablet 2  . atorvastatin (LIPITOR) 20 MG tablet TAKE 1 TABLET (20 MG TOTAL) BY MOUTH DAILY. 90 tablet 3  . CINNAMON PO Take 1,000 mg by mouth daily.    . Coenzyme Q10 200 MG capsule Take 200 mg by mouth daily.    Marland Kitchen docusate sodium (COLACE) 100 MG capsule Take 100 mg by mouth daily as  needed for mild constipation.    . isosorbide mononitrate (IMDUR) 30 MG 24 hr tablet TAKE 1 TABLET (30 MG TOTAL) BY MOUTH DAILY. 90 tablet 3  . losartan-hydrochlorothiazide (HYZAAR) 100-12.5 MG tablet TAKE 1 TABLET BY MOUTH EVERY DAY (Patient taking differently: TAKE 1/2 TABLET BY MOUTH EVERY DAY) 90 tablet 2  . meclizine (ANTIVERT) 25 MG tablet Take 25 mg by mouth 2 (two) times daily as needed (for vertigo).    . memantine (NAMENDA) 10 MG tablet Take 1 tablet (10 mg total) by mouth 2 (two) times daily. 60 tablet 11  . Multiple Vitamin (MULTIVITAMIN) tablet Take 1 tablet by mouth every morning.     . traMADol (ULTRAM) 50 MG tablet Take by mouth every 6 (six) hours as needed.    . vitamin B-12 (CYANOCOBALAMIN) 100 MCG tablet Take 200 mcg by mouth every morning.     . vitamin C (ASCORBIC ACID) 500 MG tablet Take 500 mg by mouth every morning.     No facility-administered medications prior to visit.     PAST MEDICAL HISTORY: Past Medical History:  Diagnosis Date  . Arthritis   . Atrial fibrillation ([place])   . Cardiac arrest ([place])   . Complication of anesthesia   . Gout   . Gout   . Heart attack ([place])   . Heart disease   . HLD (hyperlipidemia)   . HTN (hypertension)   . Memory disorder 12/21/2015  . PONV (postoperative nausea and vomiting)   . Sleep apnea    NPSG 10/26/07 - AHI 16.4 used5 yrs    PAST SURGICAL HISTORY: Past Surgical History:  Procedure Laterality Date  . angioplasty    . APPENDECTOMY    . CAROTID STENT    . CORONARY ANGIOPLASTY WITH STENT PLACEMENT  06/27/2007   L main OK, LAD mild irreg, CFX stent OK, irreg, RCA 60% calcified, EF 45%   . HAND SURGERY     left   . LUMBAR LAMINECTOMY/DECOMPRESSION MICRODISCECTOMY N/A 05/06/2013   Procedure: Lumbar 4-5 decompression    1 LEVEL;  Surgeon: Sinclair Ship, MD;  Location: [facility];  Service: Orthopedics;  Laterality: N/A;  Lumbar 4-5 decompression  . nasal septopalsty    . TOTAL SHOULDER ARTHROPLASTY  04/17/2011    Procedure: TOTAL SHOULDER ARTHROPLASTY; left Surgeon: Nita Sells, MD;  Location: [facility];  Service: Orthopedics;  Laterality: Left;    FAMILY HISTORY: Family History  Problem Relation Age of Onset  . Heart attack Father   . Heart disease Father   . Hyperlipidemia Father   . Other Father        [name]  . Alzheimer's disease Mother   . Colon cancer Neg Hx   . Esophageal cancer Neg Hx   . Rectal cancer Neg Hx   . Stomach cancer Neg Hx     SOCIAL HISTORY: Social History   Social History  . Marital status: Married    Spouse name: N/A  .  Number of children: 2  . Years of education: College/tech   Occupational History  . Retired    Social History Main Topics  . Smoking status: Former Smoker    Packs/day: 1.50    Years: 30.00    Types: Cigarettes    Quit date: 04/28/1987  . Smokeless tobacco: Never Used     Comment: smoked 2 ppd for 32 years; quit in 1989  . Alcohol use 9.6 oz/week    14 Cans of beer, 2 Shots of liquor per week     Comment: Coulpe of beers per day  . Drug use: No  . Sexual activity: Not on file     Comment: Married   Other Topics Concern  . Not on file   Social History Narrative   Married, 2 children.    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  Generalized: Well developed, in no acute distress   Neurological examination  Mentation: Alert oriented to time, place, history taking. Follows all commands speech and language fluent Cranial nerve II-XII: Pupils were equal round reactive to light. Extraocular movements were full, visual field were full on confrontational test. Facial sensation and strength were normal. Uvula tongue midline. Head turning and shoulder shrug  were normal and symmetric. Motor: The motor testing reveals 5 over 5 strength of all 4 extremities. Good symmetric motor tone is noted throughout.    Sensory: Sensory testing is intact to soft touch on all 4 extremities. No evidence of extinction is noted.  Coordination: Cerebellar testing reveals good finger-nose-finger and heel-to-shin bilaterally.  Gait and station: Gait is normal.  Reflexes: Deep tendon reflexes are symmetric and normal bilaterally.   DIAGNOSTIC DATA (LABS, IMAGING, TESTING) - I reviewed patient records, labs, notes, testing and imaging myself where available.  Lab Results  Component Value Date   WBC 7.9 04/27/2013   HGB 13.7 04/27/2013   HCT 39.3 04/27/2013   MCV 92.9 04/27/2013   PLT 222 04/27/2013      Component Value Date/Time   NA 138 03/16/2016 1653   K 4.4 03/16/2016 1653   CL 103 03/16/2016 1653   CO2 25 03/16/2016 1653   GLUCOSE 104 (H) 03/16/2016 1653   BUN 22 03/16/2016 1653   CREATININE 1.35 (H) 03/16/2016 1653   CALCIUM 8.9 03/16/2016 1653   PROT 7.0 04/27/2013 0940   ALBUMIN 3.8 04/27/2013 0940   AST 18 04/27/2013 0940   ALT 20 04/27/2013 0940   ALKPHOS 60 04/27/2013 0940   BILITOT 0.4 04/27/2013 0940   GFRNONAA 65 (L) 04/27/2013 0940   GFRAA 75 (L) 04/27/2013 0940   Lab Results  Component Value Date   CHOL 155 03/16/2016   HDL 45 03/16/2016   LDLCALC 54 03/16/2016   TRIG 279 (H) 03/16/2016   CHOLHDL 3.4 03/16/2016     ASSESSMENT AND PLAN 76 y.o. year old male  has a past medical history of Arthritis; Atrial fibrillation ([place]); Cardiac arrest ([place]); Complication of anesthesia; Gout; Gout; Heart attack ([place]); Heart disease; HLD (hyperlipidemia); HTN (hypertension); Memory disorder (12/21/2015); PONV (postoperative nausea and vomiting); and Sleep apnea. here with:  1. Memory disturbance  The patient's memory score has remained stable. He will continue on Namenda 10 mg twice a day. I have advised the patient that if his symptoms worsen or he develops new symptoms he  should let us know. He will follow-up in 6 months or sooner if needed.  The patient has been referred to our  research Department regarding current memory trials.  I spent 15 minutes with the patient. 50% of this time was spent discussing his memory score.      Ward Givens, MSN, NP-C 12/24/2016, 8:16 AM [facility] [address] [place], [place] [address] [phone]

## 2016-12-24 NOTE — Patient Instructions (Signed)
Your Plan:  Continue Namenda Memory score is stable If your symptoms worsen or you develop new symptoms please let us know.   Thank you for coming to see us at [facility]. I hope we have been able to provide you high quality care today.  You may receive a patient satisfaction survey over the next few weeks. We would appreciate your feedback and comments so that we may continue to improve ourselves and the health of our patients.  

## 2016-12-31 DIAGNOSIS — M47816 Spondylosis without myelopathy or radiculopathy, lumbar region: Secondary | ICD-10-CM | POA: Diagnosis not present

## 2017-01-08 DIAGNOSIS — M25532 Pain in left wrist: Secondary | ICD-10-CM | POA: Diagnosis not present

## 2017-01-08 DIAGNOSIS — Z23 Encounter for immunization: Secondary | ICD-10-CM | POA: Diagnosis not present

## 2017-01-10 ENCOUNTER — Other Ambulatory Visit: Payer: Self-pay | Admitting: Orthopedic Surgery

## 2017-01-10 DIAGNOSIS — S62025A Nondisplaced fracture of middle third of navicular [scaphoid] bone of left wrist, initial encounter for closed fracture: Secondary | ICD-10-CM | POA: Diagnosis not present

## 2017-01-15 ENCOUNTER — Encounter (HOSPITAL_BASED_OUTPATIENT_CLINIC_OR_DEPARTMENT_OTHER)
Admission: RE | Admit: 2017-01-15 | Discharge: 2017-01-15 | Disposition: A | Discharge status: Home / Self Care | Payer: Medicare Other | Source: Ambulatory Visit | Attending: Orthopedic Surgery | Admitting: Orthopedic Surgery

## 2017-01-15 ENCOUNTER — Encounter (HOSPITAL_BASED_OUTPATIENT_CLINIC_OR_DEPARTMENT_OTHER): Payer: Self-pay | Admitting: *Deleted

## 2017-01-15 DIAGNOSIS — M19012 Primary osteoarthritis, left shoulder: Secondary | ICD-10-CM | POA: Diagnosis not present

## 2017-01-15 DIAGNOSIS — G4733 Obstructive sleep apnea (adult) (pediatric): Secondary | ICD-10-CM | POA: Insufficient documentation

## 2017-01-15 DIAGNOSIS — I252 Old myocardial infarction: Secondary | ICD-10-CM | POA: Insufficient documentation

## 2017-01-15 DIAGNOSIS — I10 Essential (primary) hypertension: Secondary | ICD-10-CM | POA: Diagnosis not present

## 2017-01-15 DIAGNOSIS — Z01812 Encounter for preprocedural laboratory examination: Secondary | ICD-10-CM | POA: Diagnosis not present

## 2017-01-15 DIAGNOSIS — E785 Hyperlipidemia, unspecified: Secondary | ICD-10-CM | POA: Diagnosis not present

## 2017-01-15 DIAGNOSIS — M109 Gout, unspecified: Secondary | ICD-10-CM | POA: Insufficient documentation

## 2017-01-15 DIAGNOSIS — R413 Other amnesia: Secondary | ICD-10-CM | POA: Diagnosis not present

## 2017-01-15 DIAGNOSIS — M4800 Spinal stenosis, site unspecified: Secondary | ICD-10-CM | POA: Insufficient documentation

## 2017-01-15 DIAGNOSIS — I2510 Atherosclerotic heart disease of native coronary artery without angina pectoris: Secondary | ICD-10-CM | POA: Insufficient documentation

## 2017-01-15 LAB — BASIC METABOLIC PANEL
Anion gap: 10 (ref 5–15)
BUN: 17 mg/dL (ref 6–20)
CO2: 26 mmol/L (ref 22–32)
Calcium: 9.1 mg/dL (ref 8.9–10.3)
Chloride: 100 mmol/L — ABNORMAL LOW (ref 101–111)
Creatinine, Ser: 1.39 mg/dL — ABNORMAL HIGH (ref 0.61–1.24)
GFR calc Af Amer: 55 mL/min — ABNORMAL LOW (ref >60)
GFR calc non Af Amer: 47 mL/min — ABNORMAL LOW (ref >60)
Glucose, Bld: 122 mg/dL — ABNORMAL HIGH (ref 65–99)
Potassium: 4.7 mmol/L (ref 3.5–5.1)
Sodium: 136 mmol/L (ref 135–145)

## 2017-01-18 NOTE — H&P (Signed)
Benjamin George is an 76 y.o. male.   CC / Reason for Visit: Left wrist pain HPI: This patient is a 76 year old, right-hand-dominant, retired male who indicates that he tripped and fell on outstretched hand approximately 3 weeks ago.  He was seen in urgent care facility where x-rays were taken and at the time found to be negative.  They placed him into a thumb spica splint which she has been using with regularity.  He notes that the pain that he has been experiencing into his left wrist has recently worsened.  It is not helped with any medications nor is it helped with splinting.  He is here today for further evaluation and treatment.  The patient does note that he is not on anticoagulants, but he has a history of a heart attack.  Past Medical History:  Diagnosis Date  . Arthritis   . Atrial fibrillation ([place])   . Cardiac arrest ([place])   . Complication of anesthesia   . Gout   . Gout   . Heart attack ([place])   . Heart disease   . HLD (hyperlipidemia)   . HTN (hypertension)   . Memory disorder 12/21/2015  . PONV (postoperative nausea and vomiting)   . Sleep apnea    NPSG 10/26/07 - AHI 16.4 used5 yrs    Past Surgical History:  Procedure Laterality Date  . angioplasty    . APPENDECTOMY    . CAROTID STENT    . CORONARY ANGIOPLASTY WITH STENT PLACEMENT  06/27/2007   L main OK, LAD mild irreg, CFX stent OK, irreg, RCA 60% calcified, EF 45%   . HAND SURGERY     left   . LUMBAR LAMINECTOMY/DECOMPRESSION MICRODISCECTOMY N/A 05/06/2013   Procedure: Lumbar 4-5 decompression    1 LEVEL;  Surgeon: Sinclair Ship, MD;  Location: [facility];  Service: Orthopedics;  Laterality: N/A;  Lumbar 4-5 decompression  . nasal septopalsty    . TOTAL SHOULDER ARTHROPLASTY  04/17/2011   Procedure: TOTAL SHOULDER ARTHROPLASTY; left Surgeon: Nita Sells, MD;  Location: [facility];  Service: Orthopedics;  Laterality: Left;    Family History  Problem Relation Age of Onset  . Heart attack Father   . Heart  disease Father   . Hyperlipidemia Father   . Other Father        [name]  . Alzheimer's disease Mother   . Colon cancer Neg Hx   . Esophageal cancer Neg Hx   . Rectal cancer Neg Hx   . Stomach cancer Neg Hx    Social History:  reports that he quit smoking about 29 years ago. His smoking use included Cigarettes. He has a 45.00 pack-year smoking history. He has never used smokeless tobacco. He reports that he drinks about 9.6 oz of alcohol per week . He reports that he does not use drugs.  Allergies:  Allergies  Allergen Reactions  . Codeine Nausea And Vomiting  . Hydrocodone Nausea And Vomiting  . Aricept [Donepezil Hcl]     Nausea  . Methocarbamol Nausea Only  . Other Nausea Only    vibramyacin    No prescriptions prior to admission.    No results found for this or any previous visit (from the past 48 hour(s)). No results found.  Review of Systems  All other systems reviewed and are negative.   Height 5\' 11"  (1.803 m), weight 113.4 kg (250 lb). Physical Exam  Constitutional:  WD, WN, NAD HEENT:  NCAT, EOMI Neuro/Psych:  Alert &  oriented to person, place, and time; appropriate mood & affect Lymphatic: No generalized UE edema or lymphadenopathy Extremities / MSK:  Both UE are normal with respect to appearance, ranges of motion, joint stability, muscle strength/tone, sensation, & perfusion except as otherwise noted:  The left wrist is somewhat swollen about the anatomical snuffbox.  Light palpation into the snuffbox is exquisitely painful.  The patient is capable of making a full fist.  There is no pain over the radial styloid or with Finkelstein's.  He is NVI.  Labs / Xrays:  4 views of the left wrist ordered and obtained today including a scaphoid view demonstrate a closed, extra-articular, transverse, nondisplaced fracture of the left scaphoid through the waist.  Assessment: Left scaphoid fracture  Plan:  The findings are discussed at length with the patient.   He was provided options on the scope of treatment including closed treatment with casting for 8-10 weeks, versus surgical intervention.  He will remain in his thumb spica splint with the radial stay removed and wear it as if it were a cast with the exception of taking it off for showering until his surgery date.  The patient ultimately decided to move forward with surgical intervention of the left scaphoid.  The details of the operative procedure were discussed with the patient.  Questions were invited and answered.  In addition to the goal of the procedure, the risks of the procedure to include but not limited to bleeding; infection; damage to the nerves or blood vessels that could result in bleeding, numbness, weakness, chronic pain, and the need for additional procedures; stiffness; the need for revision surgery; and anesthetic risks were reviewed.  No specific outcome was guaranteed or implied.  Informed consent was obtained.   Nasser Ku A., MD 01/18/2017, 1:53 PM

## 2017-01-20 NOTE — Anesthesia Preprocedure Evaluation (Addendum)
Anesthesia Evaluation  Patient identified by MRN, date of birth, ID band Patient awake    Reviewed: Allergy & Precautions, H&P , NPO status , Patient's Chart, lab work & pertinent test results, reviewed documented beta blocker date and time   History of Anesthesia Complications (+) PONV and history of anesthetic complications  Airway Mallampati: II  TM Distance: >3 FB Neck ROM: Full    Dental  (+) Teeth Intact   Pulmonary sleep apnea and Continuous Positive Airway Pressure Ventilation , former smoker,    Pulmonary exam normal        Cardiovascular Exercise Tolerance: Poor hypertension, Pt. on medications and Pt. on home beta blockers + CAD, + Past MI and + Peripheral Vascular Disease  Normal cardiovascular exam+ dysrhythmias  Rhythm:Regular Rate:Normal  Few PACs and PVCs   Neuro/Psych Lower back pain with numbness and weakness down right leg  Neuromuscular disease negative psych ROS   GI/Hepatic negative GI ROS, Neg liver ROS,   Endo/Other  neg diabetesMorbid obesity  Renal/GU negative Renal ROS  negative genitourinary   Musculoskeletal  (+) Arthritis , Osteoarthritis,    Abdominal (+) + obese,   Peds  Hematology   Anesthesia Other Findings   Reproductive/Obstetrics                             Anesthesia Physical  Anesthesia Plan  ASA: III  Anesthesia Plan: General   Post-op Pain Management:    Induction: Intravenous  PONV Risk Score and Plan: 3 and Ondansetron, Dexamethasone and Treatment may vary due to age or medical condition  Airway Management Planned: Oral ETT and LMA  Additional Equipment: None  Intra-op Plan:   Post-operative Plan: Extubation in OR  Informed Consent: I have reviewed the patients History and Physical, chart, labs and discussed the procedure including the risks, benefits and alternatives for the proposed anesthesia with the patient or authorized  representative who has indicated his/her understanding and acceptance.   Dental advisory given  Plan Discussed with: CRNA and Surgeon  Anesthesia Plan Comments: ( )        Anesthesia Quick Evaluation

## 2017-01-21 ENCOUNTER — Ambulatory Visit (HOSPITAL_BASED_OUTPATIENT_CLINIC_OR_DEPARTMENT_OTHER): Payer: Medicare Other | Admitting: Anesthesiology

## 2017-01-21 ENCOUNTER — Encounter (HOSPITAL_BASED_OUTPATIENT_CLINIC_OR_DEPARTMENT_OTHER)
Admission: RE | Disposition: A | Discharge status: Home / Self Care | Payer: Self-pay | Source: Ambulatory Visit | Attending: Orthopedic Surgery

## 2017-01-21 ENCOUNTER — Ambulatory Visit (HOSPITAL_COMMUNITY): Payer: Medicare Other

## 2017-01-21 ENCOUNTER — Ambulatory Visit (HOSPITAL_BASED_OUTPATIENT_CLINIC_OR_DEPARTMENT_OTHER)
Admission: RE | Admit: 2017-01-21 | Discharge: 2017-01-21 | Disposition: A | Discharge status: Home / Self Care | Payer: Medicare Other | Source: Ambulatory Visit | Attending: Orthopedic Surgery | Admitting: Orthopedic Surgery

## 2017-01-21 ENCOUNTER — Encounter (HOSPITAL_BASED_OUTPATIENT_CLINIC_OR_DEPARTMENT_OTHER): Payer: Self-pay | Admitting: Certified Registered"

## 2017-01-21 DIAGNOSIS — I4891 Unspecified atrial fibrillation: Secondary | ICD-10-CM | POA: Insufficient documentation

## 2017-01-21 DIAGNOSIS — I10 Essential (primary) hypertension: Secondary | ICD-10-CM | POA: Diagnosis not present

## 2017-01-21 DIAGNOSIS — Z8674 Personal history of sudden cardiac arrest: Secondary | ICD-10-CM | POA: Diagnosis not present

## 2017-01-21 DIAGNOSIS — Z87891 Personal history of nicotine dependence: Secondary | ICD-10-CM | POA: Diagnosis not present

## 2017-01-21 DIAGNOSIS — Z419 Encounter for procedure for purposes other than remedying health state, unspecified: Secondary | ICD-10-CM

## 2017-01-21 DIAGNOSIS — E785 Hyperlipidemia, unspecified: Secondary | ICD-10-CM | POA: Diagnosis not present

## 2017-01-21 DIAGNOSIS — Z7982 Long term (current) use of aspirin: Secondary | ICD-10-CM | POA: Diagnosis not present

## 2017-01-21 DIAGNOSIS — G4730 Sleep apnea, unspecified: Secondary | ICD-10-CM | POA: Diagnosis not present

## 2017-01-21 DIAGNOSIS — M109 Gout, unspecified: Secondary | ICD-10-CM | POA: Insufficient documentation

## 2017-01-21 DIAGNOSIS — Y9289 Other specified places as the place of occurrence of the external cause: Secondary | ICD-10-CM | POA: Insufficient documentation

## 2017-01-21 DIAGNOSIS — W010XXA Fall on same level from slipping, tripping and stumbling without subsequent striking against object, initial encounter: Secondary | ICD-10-CM | POA: Insufficient documentation

## 2017-01-21 DIAGNOSIS — Z79899 Other long term (current) drug therapy: Secondary | ICD-10-CM | POA: Insufficient documentation

## 2017-01-21 DIAGNOSIS — S62025A Nondisplaced fracture of middle third of navicular [scaphoid] bone of left wrist, initial encounter for closed fracture: Secondary | ICD-10-CM | POA: Diagnosis not present

## 2017-01-21 DIAGNOSIS — S62002A Unspecified fracture of navicular [scaphoid] bone of left wrist, initial encounter for closed fracture: Secondary | ICD-10-CM | POA: Insufficient documentation

## 2017-01-21 DIAGNOSIS — G4733 Obstructive sleep apnea (adult) (pediatric): Secondary | ICD-10-CM | POA: Diagnosis not present

## 2017-01-21 DIAGNOSIS — Z955 Presence of coronary angioplasty implant and graft: Secondary | ICD-10-CM | POA: Diagnosis not present

## 2017-01-21 DIAGNOSIS — I252 Old myocardial infarction: Secondary | ICD-10-CM | POA: Diagnosis not present

## 2017-01-21 DIAGNOSIS — S62022A Displaced fracture of middle third of navicular [scaphoid] bone of left wrist, initial encounter for closed fracture: Secondary | ICD-10-CM | POA: Diagnosis not present

## 2017-01-21 HISTORY — PX: ORIF SCAPHOID FRACTURE: SHX2130

## 2017-01-21 SURGERY — OPEN REDUCTION INTERNAL FIXATION (ORIF) SCAPHOID FRACTURE
Anesthesia: General | Site: Wrist | Laterality: Left

## 2017-01-21 MED ORDER — CEFAZOLIN SODIUM-DEXTROSE 2-4 GM/100ML-% IV SOLN
INTRAVENOUS | Status: AC
  Filled 2017-01-21: qty 100

## 2017-01-21 MED ORDER — DEXAMETHASONE SODIUM PHOSPHATE 10 MG/ML IJ SOLN
INTRAMUSCULAR | Status: AC
  Filled 2017-01-21: qty 1

## 2017-01-21 MED ORDER — MEPERIDINE HCL 25 MG/ML IJ SOLN: 6.2500 mg | INTRAMUSCULAR | Status: DC | PRN

## 2017-01-21 MED ORDER — FENTANYL CITRATE (PF) 100 MCG/2ML IJ SOLN
INTRAMUSCULAR | Status: AC
  Filled 2017-01-21: qty 2

## 2017-01-21 MED ORDER — PROPOFOL 10 MG/ML IV BOLUS
INTRAVENOUS | Status: DC | PRN
  Administered 2017-01-21: 170 mg via INTRAVENOUS

## 2017-01-21 MED ORDER — LACTATED RINGERS IV SOLN
INTRAVENOUS | Status: DC
  Administered 2017-01-21: 11:00:00 via INTRAVENOUS

## 2017-01-21 MED ORDER — ONDANSETRON HCL 4 MG/2ML IJ SOLN
INTRAMUSCULAR | Status: DC | PRN
  Administered 2017-01-21: 4 mg via INTRAVENOUS

## 2017-01-21 MED ORDER — BUPIVACAINE-EPINEPHRINE 0.5% -1:200000 IJ SOLN
INTRAMUSCULAR | Status: DC | PRN
  Administered 2017-01-21: 10 mL

## 2017-01-21 MED ORDER — SCOPOLAMINE 1 MG/3DAYS TD PT72: 1.0000 | MEDICATED_PATCH | Freq: Once | TRANSDERMAL | Status: DC | PRN

## 2017-01-21 MED ORDER — LIDOCAINE 2% (20 MG/ML) 5 ML SYRINGE
INTRAMUSCULAR | Status: DC | PRN
  Administered 2017-01-21: 100 mg via INTRAVENOUS

## 2017-01-21 MED ORDER — PHENYLEPHRINE 40 MCG/ML (10ML) SYRINGE FOR IV PUSH (FOR BLOOD PRESSURE SUPPORT)
PREFILLED_SYRINGE | INTRAVENOUS | Status: AC
  Filled 2017-01-21: qty 10

## 2017-01-21 MED ORDER — ONDANSETRON HCL 4 MG/2ML IJ SOLN
INTRAMUSCULAR | Status: AC
Start: 2017-01-21 — End: 2017-01-21
  Filled 2017-01-21: qty 8

## 2017-01-21 MED ORDER — PROPOFOL 500 MG/50ML IV EMUL
INTRAVENOUS | Status: AC
  Filled 2017-01-21: qty 100

## 2017-01-21 MED ORDER — CEFAZOLIN SODIUM-DEXTROSE 2-4 GM/100ML-% IV SOLN
2.0000 g | INTRAVENOUS | Status: AC
  Administered 2017-01-21: 2 g via INTRAVENOUS

## 2017-01-21 MED ORDER — ESMOLOL HCL 100 MG/10ML IV SOLN
INTRAVENOUS | Status: AC
  Filled 2017-01-21: qty 10

## 2017-01-21 MED ORDER — LIDOCAINE 2% (20 MG/ML) 5 ML SYRINGE
INTRAMUSCULAR | Status: AC
Start: 2017-01-21 — End: 2017-01-21
  Filled 2017-01-21: qty 5

## 2017-01-21 MED ORDER — LACTATED RINGERS IV SOLN: INTRAVENOUS | Status: DC

## 2017-01-21 MED ORDER — FENTANYL CITRATE (PF) 100 MCG/2ML IJ SOLN
50.0000 ug | INTRAMUSCULAR | Status: DC | PRN
  Administered 2017-01-21: 25 ug via INTRAVENOUS
  Administered 2017-01-21: 50 ug via INTRAVENOUS

## 2017-01-21 MED ORDER — ONDANSETRON HCL 4 MG PO TABS: 4.0000 mg | ORAL_TABLET | Freq: Three times a day (TID) | ORAL | 0 refills | Status: DC | PRN

## 2017-01-21 MED ORDER — HYDROMORPHONE HCL 1 MG/ML IJ SOLN
INTRAMUSCULAR | Status: AC
  Filled 2017-01-21: qty 0.5

## 2017-01-21 MED ORDER — MIDAZOLAM HCL 2 MG/2ML IJ SOLN: 1.0000 mg | INTRAMUSCULAR | Status: DC | PRN

## 2017-01-21 MED ORDER — BUPIVACAINE-EPINEPHRINE (PF) 0.5% -1:200000 IJ SOLN
INTRAMUSCULAR | Status: AC
  Filled 2017-01-21: qty 30

## 2017-01-21 MED ORDER — OXYCODONE HCL 5 MG PO TABS: 5.0000 mg | ORAL_TABLET | Freq: Four times a day (QID) | ORAL | 0 refills | Status: DC | PRN

## 2017-01-21 MED ORDER — ACETAMINOPHEN 325 MG PO TABS: 650.0000 mg | ORAL_TABLET | Freq: Four times a day (QID) | ORAL | Status: DC | PRN

## 2017-01-21 MED ORDER — FENTANYL CITRATE (PF) 100 MCG/2ML IJ SOLN
25.0000 ug | INTRAMUSCULAR | Status: DC | PRN
  Administered 2017-01-21: 25 ug via INTRAVENOUS

## 2017-01-21 SURGICAL SUPPLY — 57 items
BANDAGE COBAN STERILE 2 (GAUZE/BANDAGES/DRESSINGS) IMPLANT
BIT DRILL MINI LNG ACUTRAK 2 (BIT) IMPLANT
BLADE MINI RND TIP GREEN BEAV (BLADE) IMPLANT
BLADE SURG 15 STRL LF DISP TIS (BLADE) ×1 IMPLANT
BLADE SURG 15 STRL SS (BLADE) ×2
BNDG CMPR 9X4 STRL LF SNTH (GAUZE/BANDAGES/DRESSINGS) ×1
BNDG COHESIVE 4X5 TAN STRL (GAUZE/BANDAGES/DRESSINGS) ×2 IMPLANT
BNDG ESMARK 4X9 LF (GAUZE/BANDAGES/DRESSINGS) ×2 IMPLANT
BNDG GAUZE ELAST 4 BULKY (GAUZE/BANDAGES/DRESSINGS) ×2 IMPLANT
BRUSH SCRUB EZ PLAIN DRY (MISCELLANEOUS) IMPLANT
CANISTER SUCT 1200ML W/VALVE (MISCELLANEOUS) ×2 IMPLANT
CHLORAPREP W/TINT 26ML (MISCELLANEOUS) ×2 IMPLANT
CORD BIPOLAR FORCEPS 12FT (ELECTRODE) ×2 IMPLANT
COVER BACK TABLE 60X90IN (DRAPES) ×2 IMPLANT
COVER MAYO STAND STRL (DRAPES) ×2 IMPLANT
CUFF TOURNIQUET SINGLE 18IN (TOURNIQUET CUFF) ×1 IMPLANT
CUFF TOURNIQUET SINGLE 24IN (TOURNIQUET CUFF) IMPLANT
DRAPE C-ARM 42X72 X-RAY (DRAPES) ×2 IMPLANT
DRAPE EXTREMITY T 121X128X90 (DRAPE) ×2 IMPLANT
DRAPE SURG 17X23 STRL (DRAPES) ×2 IMPLANT
DRILL MINI LNG ACUTRAK 2 (BIT) ×2
DRSG ADAPTIC 3X8 NADH LF (GAUZE/BANDAGES/DRESSINGS) ×1 IMPLANT
DRSG EMULSION OIL 3X3 NADH (GAUZE/BANDAGES/DRESSINGS) ×1 IMPLANT
ELECT REM PT RETURN 9FT ADLT (ELECTROSURGICAL) ×2
ELECTRODE REM PT RTRN 9FT ADLT (ELECTROSURGICAL) ×1 IMPLANT
GAUZE SPONGE 4X4 12PLY STRL LF (GAUZE/BANDAGES/DRESSINGS) ×2 IMPLANT
GLOVE BIO SURGEON STRL SZ7.5 (GLOVE) ×2 IMPLANT
GLOVE BIOGEL PI IND STRL 7.0 (GLOVE) ×1 IMPLANT
GLOVE BIOGEL PI IND STRL 8 (GLOVE) ×1 IMPLANT
GLOVE BIOGEL PI INDICATOR 7.0 (GLOVE) ×2
GLOVE BIOGEL PI INDICATOR 8 (GLOVE) ×1
GLOVE ECLIPSE 6.5 STRL STRAW (GLOVE) ×3 IMPLANT
GOWN STRL REUS W/TWL XL LVL3 (GOWN DISPOSABLE) ×2 IMPLANT
GUIDEWIRE ORTHO MINI ACTK .045 (WIRE) ×1 IMPLANT
NDL 16GX1 1/2 (NEEDLE) IMPLANT
NDL HYPO 25X1 1.5 SAFETY (NEEDLE) IMPLANT
NEEDLE 16GX1 1/2 (NEEDLE) ×2 IMPLANT
NEEDLE HYPO 25X1 1.5 SAFETY (NEEDLE) IMPLANT
NS IRRIG 1000ML POUR BTL (IV SOLUTION) ×2 IMPLANT
PACK BASIN DAY SURGERY FS (CUSTOM PROCEDURE TRAY) ×2 IMPLANT
PADDING CAST ABS 4INX4YD NS (CAST SUPPLIES) ×1
PADDING CAST ABS COTTON 4X4 ST (CAST SUPPLIES) IMPLANT
PENCIL BUTTON HOLSTER BLD 10FT (ELECTRODE) ×1 IMPLANT
RUBBERBAND STERILE (MISCELLANEOUS) IMPLANT
SCREW ACUTRAK 2 MINI 18MM (Screw) ×1 IMPLANT
SLEEVE SCD COMPRESS KNEE MED (MISCELLANEOUS) ×2 IMPLANT
STOCKINETTE 6  STRL (DRAPES) ×1
STOCKINETTE 6 STRL (DRAPES) ×1 IMPLANT
SUT VIC AB 2-0 CT3 27 (SUTURE) ×2 IMPLANT
SUT VICRYL 4-0 PS2 18IN ABS (SUTURE) IMPLANT
SUT VICRYL RAPIDE 4-0 (SUTURE) IMPLANT
SUT VICRYL RAPIDE 4/0 PS 2 (SUTURE) ×2 IMPLANT
SYR 10ML LL (SYRINGE) ×1 IMPLANT
SYR BULB 3OZ (MISCELLANEOUS) ×2 IMPLANT
TOWEL OR 17X24 6PK STRL BLUE (TOWEL DISPOSABLE) ×2 IMPLANT
TOWEL OR NON WOVEN STRL DISP B (DISPOSABLE) ×2 IMPLANT
UNDERPAD 30X30 (UNDERPADS AND DIAPERS) ×2 IMPLANT

## 2017-01-21 NOTE — Anesthesia Procedure Notes (Signed)
Procedure Name: LMA Insertion Date/Time: 01/21/2017 1:12 PM Performed by: Lieutenant Diego Pre-anesthesia Checklist: Patient identified, Emergency Drugs available, Suction available and Patient being monitored Patient Re-evaluated:Patient Re-evaluated prior to induction Oxygen Delivery Method: Circle system utilized Preoxygenation: Pre-oxygenation with 100% oxygen Induction Type: IV induction Ventilation: Mask ventilation without difficulty LMA: LMA inserted LMA Size: 5.0 Number of attempts: 1 Airway Equipment and Method: Bite block Placement Confirmation: positive ETCO2 and breath sounds checked- equal and bilateral Tube secured with: Tape Dental Injury: Teeth and Oropharynx as per pre-operative assessment

## 2017-01-21 NOTE — Interval H&P Note (Signed)
History and Physical Interval Note:  01/21/2017 12:29 PM  [place]  has presented today for surgery, with the diagnosis of LEFT SCAPHOID FRACTURE S62.025A  The various methods of treatment have been discussed with the patient and family. After consideration of risks, benefits and other options for treatment, the patient has consented to  Procedure(s): OPEN TREATMENT OF LEFT SCAPHOID FRACTURE (Left) as a surgical intervention .  The patient's history has been reviewed, patient examined, no change in status, stable for surgery.  I have reviewed the patient's chart and labs.  Questions were answered to the patient's satisfaction.     Sadako Cegielski A.

## 2017-01-21 NOTE — Discharge Instructions (Signed)
Discharge Instructions   You have a dressing with a plaster splint incorporated in it. Move your fingers as much as possible, making a full fist and fully opening the fist. Elevate your hand to reduce pain & swelling of the digits.  Ice over the operative site may be helpful to reduce pain & swelling.  DO NOT USE HEAT. Pain medicine has been prescribed for you.  Take tylenol 650 mg every 6 hours. Take Oxycodone as a rescue medicine if you are in severe pain. The Zofran is to use for nausea and vomiting associated with Oxycodone. Leave the dressing in place until you return to our office.  You may shower, but keep the bandage clean & dry.  You may drive a car when you are off of prescription pain medications and can safely control your vehicle with both hands. Call our office and return to clinic in 10-15 days from the date of surgery.   Please call [phone] during normal business hours or [phone] after hours for any problems. Including the following:  - excessive redness of the incisions - drainage for more than 4 days - fever of more than 101.5 F  *Please note that pain medications will not be refilled after hours or on weekends.   Post Anesthesia Home Care Instructions  Activity: Get plenty of rest for the remainder of the day. A responsible individual must stay with you for 24 hours following the procedure.  For the next 24 hours, DO NOT: -Drive a car -Paediatric nurse -Drink alcoholic beverages -Take any medication unless instructed by your physician -Make any legal decisions or sign important papers.  Meals: Start with liquid foods such as gelatin or soup. Progress to regular foods as tolerated. Avoid greasy, spicy, heavy foods. If nausea and/or vomiting occur, drink only clear liquids until the nausea and/or vomiting subsides. Call your physician if vomiting continues.  Special Instructions/Symptoms: Your throat may feel dry or sore from the anesthesia or the  breathing tube placed in your throat during surgery. If this causes discomfort, gargle with warm salt water. The discomfort should disappear within 24 hours.  If you had a scopolamine patch placed behind your ear for the management of post- operative nausea and/or vomiting:  1. The medication in the patch is effective for 72 hours, after which it should be removed.  Wrap patch in a tissue and discard in the trash. Wash hands thoroughly with soap and water. 2. You may remove the patch earlier than 72 hours if you experience unpleasant side effects which may include dry mouth, dizziness or visual disturbances. 3. Avoid touching the patch. Wash your hands with soap and water after contact with the patch.

## 2017-01-21 NOTE — Anesthesia Postprocedure Evaluation (Signed)
Anesthesia Post Note  Patient: Benjamin George  Procedure(s) Performed: OPEN TREATMENT OF LEFT SCAPHOID FRACTURE (Left Wrist)     Anesthesia Type: General    Last Vitals:  Vitals:   01/21/17 1356 01/21/17 1400  BP: (!) 122/55 122/61  Pulse: 66 67  Resp: 19 (!) 21  Temp: 36.6 C   SpO2: 99% 97%    Last Pain:  Vitals:   01/21/17 1356  TempSrc:   PainSc: 0-No pain                 Daviona Herbert

## 2017-01-21 NOTE — Addendum Note (Signed)
Addendum  created 01/21/17 1509 by Janeece Riggers, MD   Sign clinical note

## 2017-01-21 NOTE — Transfer of Care (Signed)
Immediate Anesthesia Transfer of Care Note  Patient: Benjamin George  Procedure(s) Performed: OPEN TREATMENT OF LEFT SCAPHOID FRACTURE (Left Wrist)  Patient Location: PACU  Anesthesia Type:General  Level of Consciousness: awake  Airway & Oxygen Therapy: Patient Spontanous Breathing and Patient connected to face mask oxygen  Post-op Assessment: Report given to RN and Post -op Vital signs reviewed and stable  Post vital signs: Reviewed and stable  Last Vitals:  Vitals:   01/21/17 1024  BP: (!) 123/56  Pulse: (!) 57  Resp: 18  Temp: 36.4 C  SpO2: 97%    Last Pain:  Vitals:   01/21/17 1024  TempSrc: Oral  PainSc: 5       Patients Stated Pain Goal: 2 (86/77/37 [id number])  Complications: No apparent anesthesia complications

## 2017-01-21 NOTE — Anesthesia Postprocedure Evaluation (Signed)
Anesthesia Post Note  Patient: Benjamin George  Procedure(s) Performed: OPEN TREATMENT OF LEFT SCAPHOID FRACTURE (Left Wrist)     Patient location during evaluation: PACU Anesthesia Type: General Level of consciousness: awake and alert Pain management: pain level controlled Vital Signs Assessment: post-procedure vital signs reviewed and stable Respiratory status: spontaneous breathing, nonlabored ventilation, respiratory function stable and patient connected to nasal cannula oxygen Cardiovascular status: blood pressure returned to baseline and stable Postop Assessment: no apparent nausea or vomiting Anesthetic complications: no    Last Vitals:  Vitals:   01/21/17 1356 01/21/17 1400  BP: (!) 122/55 122/61  Pulse: 66 67  Resp: 19 (!) 21  Temp: 36.6 C   SpO2: 99% 97%    Last Pain:  Vitals:   01/21/17 1356  TempSrc:   PainSc: 0-No pain                 Kimble Delaurentis

## 2017-01-21 NOTE — Op Note (Signed)
01/21/2017  12:29 PM  PATIENT:  Benjamin George  76 y.o. male  PRE-OPERATIVE DIAGNOSIS:  Displaced left scaphoid fx  POST-OPERATIVE DIAGNOSIS:  Same  PROCEDURE:  ORIF L Scaphoid fx  SURGEON: Rayvon Char. Grandville Silos, MD  PHYSICIAN ASSISTANT: Morley Kos, OPA-C  ANESTHESIA:  general  SPECIMENS:  None  DRAINS:   None  EBL:  less than 50 mL  PREOPERATIVE INDICATIONS:  [name] is a  76 y.o. male with a relatively acute displaced L scaphoid fx  The risks benefits and alternatives were discussed with the patient preoperatively including but not limited to the risks of infection, bleeding, nerve injury, cardiopulmonary complications, the need for revision surgery, among others, and the patient verbalized understanding and consented to proceed.  OPERATIVE IMPLANTS: Mini-acutrak scew x 1  OPERATIVE PROCEDURE:  After receiving prophylactic antibiotics, the patient was escorted to the operative theatre and placed in a supine position.  A surgical "time-out" was performed during which the planned procedure, proposed operative site, and the correct patient identity were compared to the operative consent and agreement confirmed by the circulating nurse according to current facility policy.  Following application of a tourniquet to the operative extremity, the exposed skin was prepped with Chloraprep and draped in the usual sterile fashion.  The limb was exsanguinated with an Esmarch bandage and the tourniquet inflated to approximately 157mmHg higher than systolic BP.  The appropriate starting position was marked fluoroscopically and made sharply with a scalpel.  This was an incision about 5 mm in length.  Spreading dissection was carried down to the capsule, incising the extensor retinaculum to allow access to the capsule.  Checking to be sure the extensor tendons were appropriately out of the way, a 16-gauge hypodermic needle was advanced through the capsule and fluoroscopically guided to  an appropriate starting position on the scaphoid.  This was checked in multiple planes.  Once properly positioned, the guidepin for the mini Acutrak screw was then placed down the appropriate central axis of the scaphoid.  The appropriate length screw was measured, and after measuring 22 and 18 was selected.  The guidepin was advanced out palmarly so that the end could be grasped and the drill placed with a soft tissue protector.  The drill was advanced to the distal aspect of the scaphoid but did not penetrate the articular surface distally.  This was then removed and an 18 mm mini Acutrak screw was placed down the central axis.  The guidepin was removed.  Alignment was judged to be anatomic with good compression.  Final images were obtained and the tourniquet was released.  The skin was irrigated and then closed with 4-0 Vicryl Rapide interrupted sutures 2.  A short arm thumb spica splint dressing with a thumb spica plaster component was applied, Half percent Marcaine with epinephrine was instilled in the region of the skin incision and some intra-articularly to help with postoperative pain control,and he was awakened and taken to the recovery room in stable condition, breathing spontaneously.    DISPOSITION: He will be discharged home today with typical instructions, returning in 10-15 days with new x-rays of the left wrist to include a scaphoid view out of splint and conversion to the short arm thumb spica brace with the radial stay removed that he already has in his possession and was asked to bring to his first postop visit.

## 2017-01-22 ENCOUNTER — Encounter (HOSPITAL_BASED_OUTPATIENT_CLINIC_OR_DEPARTMENT_OTHER): Payer: Self-pay | Admitting: Orthopedic Surgery

## 2017-02-04 DIAGNOSIS — M5136 Other intervertebral disc degeneration, lumbar region: Secondary | ICD-10-CM | POA: Diagnosis not present

## 2017-02-04 DIAGNOSIS — M47816 Spondylosis without myelopathy or radiculopathy, lumbar region: Secondary | ICD-10-CM | POA: Diagnosis not present

## 2017-02-04 DIAGNOSIS — M5137 Other intervertebral disc degeneration, lumbosacral region: Secondary | ICD-10-CM | POA: Diagnosis not present

## 2017-02-04 DIAGNOSIS — S62025D Nondisplaced fracture of middle third of navicular [scaphoid] bone of left wrist, subsequent encounter for fracture with routine healing: Secondary | ICD-10-CM | POA: Diagnosis not present

## 2017-02-17 ENCOUNTER — Other Ambulatory Visit: Payer: Self-pay | Admitting: Cardiology

## 2017-02-17 DIAGNOSIS — R002 Palpitations: Secondary | ICD-10-CM

## 2017-02-18 ENCOUNTER — Other Ambulatory Visit: Payer: Self-pay | Admitting: Cardiology

## 2017-02-18 DIAGNOSIS — M47816 Spondylosis without myelopathy or radiculopathy, lumbar region: Secondary | ICD-10-CM | POA: Diagnosis not present

## 2017-03-07 DIAGNOSIS — S62025D Nondisplaced fracture of middle third of navicular [scaphoid] bone of left wrist, subsequent encounter for fracture with routine healing: Secondary | ICD-10-CM | POA: Diagnosis not present

## 2017-03-12 DIAGNOSIS — G8929 Other chronic pain: Secondary | ICD-10-CM | POA: Diagnosis not present

## 2017-03-12 DIAGNOSIS — M545 Low back pain: Secondary | ICD-10-CM | POA: Diagnosis not present

## 2017-04-06 ENCOUNTER — Other Ambulatory Visit: Payer: Self-pay | Admitting: Cardiology

## 2017-04-10 DIAGNOSIS — S62025D Nondisplaced fracture of middle third of navicular [scaphoid] bone of left wrist, subsequent encounter for fracture with routine healing: Secondary | ICD-10-CM | POA: Diagnosis not present

## 2017-04-14 ENCOUNTER — Other Ambulatory Visit: Payer: Self-pay | Admitting: Cardiology

## 2017-04-18 ENCOUNTER — Ambulatory Visit: Payer: Medicare Other | Admitting: Internal Medicine

## 2017-04-18 ENCOUNTER — Encounter: Payer: Self-pay | Admitting: Internal Medicine

## 2017-04-18 VITALS — BP 116/78 | HR 62 | Ht 71.0 in | Wt 248.0 lb

## 2017-04-18 DIAGNOSIS — I255 Ischemic cardiomyopathy: Secondary | ICD-10-CM

## 2017-04-18 DIAGNOSIS — I10 Essential (primary) hypertension: Secondary | ICD-10-CM

## 2017-04-18 DIAGNOSIS — R350 Frequency of micturition: Secondary | ICD-10-CM | POA: Diagnosis not present

## 2017-04-18 DIAGNOSIS — I25118 Atherosclerotic heart disease of native coronary artery with other forms of angina pectoris: Secondary | ICD-10-CM

## 2017-04-18 DIAGNOSIS — E785 Hyperlipidemia, unspecified: Secondary | ICD-10-CM

## 2017-04-18 NOTE — Progress Notes (Signed)
Follow-up Outpatient Visit Date: 04/18/2017  Primary Care Provider: [name], MD [address] [place] [address]  Chief Complaint: Follow-up coronary artery disease  HPI:  Benjamin George is a 77 y.o. year-old male with history of coronary artery disease status post remote PCI's to the RCA and LCx, ischemic cardiomyopathy with normalization of LVEF (55-65% by echo in 11/2010), hypertension, obesity, and obstructive sleep apnea, who presents for follow-up of coronary artery disease. He was previously followed by Dr. Aundra Dubin, having last been seen in 03/2016. At that time, he was doing well with the exception of bilateral thigh pain with walking.  Today, Mr. is that his main complaints are of continued left arm and low back pain.  He tripped and fell 4-5 months ago, fracturing the left wrist.  He is still in a removable brace.  From a heart standpoint, he reports feeling relatively well.  He notes occasional shortness of breath with cardiac exercises, unchanged for more than a year.  He has experienced only rare, sharp, shooting pains across his chest that last 1-2 seconds and are nonexertional.  He denies palpitations, lightheadedness, and edema.  He has noted some balance issues but no lightheadedness.  He has lost 15 pounds over the last year, which she attributes to changes in his diet.  He notes frequent urination at night, and plans to discuss this with his PCP at upcoming visit.  --------------------------------------------------------------------------------------------------  Past Medical History:  Diagnosis Date  . Arthritis   . Atrial fibrillation ([facility])   . Cardiac arrest ([place])   . Complication of anesthesia   . Gout   . Gout   . Heart attack ([facility])   . Heart disease   . HLD (hyperlipidemia)   . HTN (hypertension)   . Memory disorder 12/21/2015  . PONV (postoperative nausea and vomiting)   . Sleep apnea    NPSG 10/26/07 - AHI 16.4 used5 yrs   Past Surgical  History:  Procedure Laterality Date  . angioplasty    . APPENDECTOMY    . CAROTID STENT    . CORONARY ANGIOPLASTY WITH STENT PLACEMENT  06/27/2007   L main OK, LAD mild irreg, CFX stent OK, irreg, RCA 60% calcified, EF 45%   . HAND SURGERY     left   . LUMBAR LAMINECTOMY/DECOMPRESSION MICRODISCECTOMY N/A 05/06/2013   Procedure: Lumbar 4-5 decompression    1 LEVEL;  Surgeon: Sinclair Ship, MD;  Location: [facility];  Service: Orthopedics;  Laterality: N/A;  Lumbar 4-5 decompression  . nasal septopalsty    . ORIF SCAPHOID FRACTURE Left 01/21/2017   Procedure: OPEN TREATMENT OF LEFT SCAPHOID FRACTURE;  Surgeon: Milly Jakob, MD;  Location: [facility];  Service: Orthopedics;  Laterality: Left;  . TOTAL SHOULDER ARTHROPLASTY  04/17/2011   Procedure: TOTAL SHOULDER ARTHROPLASTY; left Surgeon: Nita Sells, MD;  Location: [facility];  Service: Orthopedics;  Laterality: Left;    Current Meds  Medication Sig  . acetaminophen (TYLENOL) 325 MG tablet Take 2 tablets (650 mg total) by mouth every 6 (six) hours as needed for mild pain or moderate pain.  Marland Kitchen allopurinol (ZYLOPRIM) 100 MG tablet Take 100 mg by mouth every morning.   Marland Kitchen aspirin 81 MG tablet Take 81 mg by mouth daily.  Marland Kitchen atenolol (TENORMIN) 50 MG tablet Take 1 tablet (50 mg total) daily by mouth. Please keep upcoming appt in January with Dr. Saunders Revel. Thanks  . atorvastatin (LIPITOR) 20 MG tablet TAKE 1 TABLET BY MOUTH EVERY  DAY  . CINNAMON PO Take 1,000 mg by mouth daily.  . Coenzyme Q10 200 MG capsule Take 200 mg by mouth daily.  . isosorbide mononitrate (IMDUR) 30 MG 24 hr tablet Take 1 tablet (30 mg total) by mouth daily. Please keep upcoming appt for future refills. Thank you  . LINZESS 290 MCG CAPS capsule 290 mcg.  . losartan-hydrochlorothiazide (HYZAAR) 100-12.5 MG tablet TAKE 1 TABLET BY MOUTH EVERY DAY (Patient taking differently: TAKE 1/2 TABLET BY MOUTH EVERY DAY)  . meclizine (ANTIVERT) 25 MG tablet Take 25  mg by mouth 2 (two) times daily as needed (for vertigo).  . memantine (NAMENDA) 10 MG tablet Take 1 tablet (10 mg total) by mouth 2 (two) times daily.  . Multiple Vitamin (MULTIVITAMIN) tablet Take 1 tablet by mouth every morning.   . ondansetron (ZOFRAN) 4 MG tablet Take 1 tablet (4 mg total) by mouth every 8 (eight) hours as needed for nausea or vomiting.  . traMADol (ULTRAM) 50 MG tablet Take by mouth every 6 (six) hours as needed.  . vitamin B-12 (CYANOCOBALAMIN) 100 MCG tablet Take 200 mcg by mouth every morning.   . vitamin C (ASCORBIC ACID) 500 MG tablet Take 500 mg by mouth every morning.    Allergies: Codeine; Hydrocodone; Aricept [donepezil hcl]; Methocarbamol; and Other  Social History   Socioeconomic History  . Marital status: Married    Spouse name: Not on file  . Number of children: 2  . Years of education: College/tech  . Highest education level: Not on file  Social Needs  . Financial resource strain: Not on file  . Food insecurity - worry: Not on file  . Food insecurity - inability: Not on file  . Transportation needs - medical: Not on file  . Transportation needs - non-medical: Not on file  Occupational History  . Occupation: Retired  Tobacco Use  . Smoking status: Former Smoker    Packs/day: 1.50    Years: 30.00    Pack years: 45.00    Types: Cigarettes    Last attempt to quit: 04/28/1987    Years since quitting: 29.9  . Smokeless tobacco: Never Used  . Tobacco comment: smoked 2 ppd for 32 years; quit in 1989  Substance and Sexual Activity  . Alcohol use: Yes    Alcohol/week: 9.6 oz    Types: 14 Cans of beer, 2 Shots of liquor per week    Comment: [place] of beers per day  . Drug use: No  . Sexual activity: Not on file    Comment: Married  Other Topics Concern  . Not on file  Social History Narrative   Married, 2 children.    Retired VP at [employer] History  Problem Relation Age of Onset  . Heart attack Father   . Heart disease Father     . Hyperlipidemia Father   . Other Father        [name]  . Alzheimer's disease Mother   . Colon cancer Neg Hx   . Esophageal cancer Neg Hx   . Rectal cancer Neg Hx   . Stomach cancer Neg Hx     Review of Systems: A 12-system review of systems was performed and was negative except as noted in the HPI.  --------------------------------------------------------------------------------------------------  Physical Exam: BP 116/78   Pulse 62   Ht 5\' 11"  (1.803 m)   Wt 248 lb (112.5 kg)   SpO2 96%   BMI 34.59 kg/m   General:  Obese man, seated comfortably in the exam room. HEENT: No conjunctival pallor or scleral icterus. Moist mucous membranes.  OP clear. Neck: Supple without lymphadenopathy, thyromegaly, JVD, or HJR.  Lungs: Normal work of breathing. Clear to auscultation bilaterally without wheezes or crackles. Heart: Regular rate and rhythm without murmurs, rubs, or gallops. Non-displaced PMI. Abd: Bowel sounds present. Soft, NT/ND.  Unable to assess HSM due to body habitus. Ext: Trace pretibial edema bilaterally.  1+ pedal pulses bilaterally.  Coronary artery disease without angina  Skin: Warm and dry without rash.  EKG:  NSR with 1st degree AVB. Otherwise, no significant abnormalities or changes from prior tracing.  Lab Results  Component Value Date   WBC 7.9 04/27/2013   HGB 13.7 04/27/2013   HCT 39.3 04/27/2013   MCV 92.9 04/27/2013   PLT 222 04/27/2013    Lab Results  Component Value Date   NA 136 01/15/2017   K 4.7 01/15/2017   CL 100 (L) 01/15/2017   CO2 26 01/15/2017   BUN 17 01/15/2017   CREATININE 1.39 (H) 01/15/2017   GLUCOSE 122 (H) 01/15/2017   ALT 20 04/27/2013    Lab Results  Component Value Date   CHOL 155 03/16/2016   HDL 45 03/16/2016   LDLCALC 54 03/16/2016   TRIG 279 (H) 03/16/2016   CHOLHDL 3.4 03/16/2016    --------------------------------------------------------------------------------------------------  ASSESSMENT AND  PLAN: Coronary artery disease with stable angina No significant change in dyspnea on exertion and atypical chest pain since last year.  Continue current doses of atenolol and isosorbide mononitrate for antianginal therapy, as well as low-dose aspirin.  Ischemic cardiomyopathy LVEF has returned to normal.  Continue atenolol and losartan.  Benjamin George appears euvolemic today.  Hypertension Blood pressure is well controlled.  No medication changes.  Hyperlipidemia LDL slightly above goal when last checked by Dr. Maudie Mercury (63).  We discussed escalation of atorvastatin but have agreed to leave him at 20 mg daily.  I encouraged continue weight loss.  Increased urinary frequency/nocturia Patient to follow-up with Dr. Maudie Mercury for further assessment.  Follow-up: Return to clinic in 1 year.  Nelva Bush, MD 04/18/2017 10:02 AM

## 2017-04-18 NOTE — Patient Instructions (Signed)
Medication Instructions:  Your physician recommends that you continue on your current medications as directed. Please refer to the Current Medication list given to you today.   Labwork: None   Testing/Procedures: None   Follow-Up: Your physician wants you to follow-up in: 1 year with Dr End. (January 2020). You will receive a reminder letter in the mail two months in advance. If you don't receive a letter, please call our office to schedule the follow-up appointment.        If you need a refill on your cardiac medications before your next appointment, please call your pharmacy.   

## 2017-04-19 DIAGNOSIS — C61 Malignant neoplasm of prostate: Secondary | ICD-10-CM | POA: Diagnosis not present

## 2017-04-22 DIAGNOSIS — S62002K Unspecified fracture of navicular [scaphoid] bone of left wrist, subsequent encounter for fracture with nonunion: Secondary | ICD-10-CM | POA: Diagnosis not present

## 2017-04-24 DIAGNOSIS — C61 Malignant neoplasm of prostate: Secondary | ICD-10-CM | POA: Diagnosis not present

## 2017-04-29 DIAGNOSIS — G4733 Obstructive sleep apnea (adult) (pediatric): Secondary | ICD-10-CM | POA: Diagnosis not present

## 2017-05-06 ENCOUNTER — Encounter: Payer: Self-pay | Admitting: Internal Medicine

## 2017-05-06 ENCOUNTER — Ambulatory Visit: Payer: Medicare Other | Admitting: Internal Medicine

## 2017-05-06 DIAGNOSIS — I255 Ischemic cardiomyopathy: Secondary | ICD-10-CM | POA: Diagnosis not present

## 2017-05-06 DIAGNOSIS — G4733 Obstructive sleep apnea (adult) (pediatric): Secondary | ICD-10-CM

## 2017-05-06 NOTE — Patient Instructions (Signed)
Order- [facility]- please replace old CPAP machine, change to auto 5-15, mask of choice, humidifier, supplies, airView    Dx OSA  Please call as needed

## 2017-05-06 NOTE — Progress Notes (Signed)
05/04/2016-76 year old male former smoker coming to reestablish for management of OSA. Medical problems include allergic rhinitis, CAD/ MI, morbid obesity, insomnia NPSG-10/16/2007-AHI 16.4/hour, desaturation to 83%. Periodic limb movement with arousal 8.2/hour. CPAP 10/ [facility] He needed to reestablish contact so that he could get supplies updated from [facility] He states he cannot sleep without CPAP. Current machine is over 9 years old.. Full face mask is worn out and won't seal.  He denies daytime sleepiness or snoring. Nocturia 3 but otherwise he says he falls asleep easily and feels sufficiently rested in the daytime. No sleep medicines. Smoked 2 packs per day for many years but denies routine cough or wheeze and has not been told he has chronic lung disease.  05/06/17- 77 year old male former smoker followed for management of OSA, complicated by allergic rhinitis, CAD/ MI, morbid obesity, insomnia CPAP 10/[facility] Pt is doing well overall with cpap machine. Would like to know if he could get new machine. No download available He reports good compliance and comfortable sleep using CPAP despite discomforts related to chronic back pain and to a slowly healing left wrist fracture.  Uses full facemask.  ROS-see HPI   + = positive Constitutional:    weight loss, night sweats, fevers, chills, fatigue, lassitude. HEENT:    headaches, difficulty swallowing, tooth/dental problems, sore throat,       sneezing, itching, ear ache, nasal congestion, post nasal drip, snoring CV:    chest pain, orthopnea, PND, swelling in lower extremities, anasarca,                                                          dizziness, palpitations Resp:   shortness of breath with exertion or at rest.                productive cough,   non-productive cough, coughing up of blood.              change in color of mucus.  wheezing.   Skin:    rash or lesions. GI:  No-   heartburn, indigestion, abdominal pain, nausea,  vomiting, diarrhea,                 change in bowel habits, loss of appetite GU: dysuria, change in color of urine, no urgency or frequency.   flank pain. MS:   joint pain, stiffness, decreased range of motion, +back pain. Neuro-     nothing unusual Psych:  change in mood or affect.  depression or anxiety.   memory loss.  OBJ- Physical Exam General- Alert, Oriented, Affect-appropriate, Distress- none acute, + Obese Skin- rash-none, lesions- none, excoriation- none Lymphadenopathy- none Head- atraumatic            Eyes- Gross vision intact, PERRLA, conjunctivae and secretions clear            Ears- Hearing, canals-normal            Nose- Clear, no-Septal dev, mucus, polyps, erosion, perforation             Throat- Mallampati IV , mucosa clear , drainage- none, tonsils- atrophic Neck- flexible , trachea midline, no stridor , thyroid nl, carotid no bruit Chest - symmetrical excursion , unlabored           Heart/CV- RRR , no murmur , no gallop  ,  no rub, nl s1 s2                           - JVD- none , edema- none, stasis changes- none, varices- none           Lung-clear/unlabored, wheeze- none, cough- none , dullness-none, rub- none           Chest wall-  Abd-  Br/ Gen/ Rectal- Not done, not indicated Extrem- cyanosis- none, clubbing, none, atrophy- none, strength- nl, soft cast left wrist Neuro- grossly intact to observation

## 2017-05-06 NOTE — Assessment & Plan Note (Signed)
Older machine due to be replaced.  I would like to take the opportunity to change to AutoPap 5- 15.  He has felt better using CPAP and describes good compliance and control.

## 2017-05-06 NOTE — Assessment & Plan Note (Signed)
No new events reported.  He continues cardiology follow-up.

## 2017-05-06 NOTE — Assessment & Plan Note (Signed)
He has not been prepared to make lifestyle changes that would result in meaningful sustained weight loss.

## 2017-05-06 NOTE — Addendum Note (Signed)
Addended by: Georjean Mode on: 05/06/2017 11:10 AM   Modules accepted: Orders

## 2017-05-10 DIAGNOSIS — I10 Essential (primary) hypertension: Secondary | ICD-10-CM | POA: Diagnosis not present

## 2017-05-10 DIAGNOSIS — G4733 Obstructive sleep apnea (adult) (pediatric): Secondary | ICD-10-CM | POA: Diagnosis not present

## 2017-05-18 ENCOUNTER — Other Ambulatory Visit: Payer: Self-pay | Admitting: Cardiology

## 2017-05-18 DIAGNOSIS — R002 Palpitations: Secondary | ICD-10-CM

## 2017-05-22 DIAGNOSIS — S62025G Nondisplaced fracture of middle third of navicular [scaphoid] bone of left wrist, subsequent encounter for fracture with delayed healing: Secondary | ICD-10-CM | POA: Diagnosis not present

## 2017-06-07 DIAGNOSIS — G4733 Obstructive sleep apnea (adult) (pediatric): Secondary | ICD-10-CM | POA: Diagnosis not present

## 2017-06-10 ENCOUNTER — Encounter: Payer: Self-pay | Admitting: Internal Medicine

## 2017-06-11 DIAGNOSIS — Z5181 Encounter for therapeutic drug level monitoring: Secondary | ICD-10-CM | POA: Diagnosis not present

## 2017-06-11 DIAGNOSIS — R972 Elevated prostate specific antigen [PSA]: Secondary | ICD-10-CM | POA: Diagnosis not present

## 2017-06-11 DIAGNOSIS — R739 Hyperglycemia, unspecified: Secondary | ICD-10-CM | POA: Diagnosis not present

## 2017-06-11 DIAGNOSIS — M545 Low back pain: Secondary | ICD-10-CM | POA: Diagnosis not present

## 2017-06-11 DIAGNOSIS — I10 Essential (primary) hypertension: Secondary | ICD-10-CM | POA: Diagnosis not present

## 2017-06-18 DIAGNOSIS — M5442 Lumbago with sciatica, left side: Secondary | ICD-10-CM | POA: Diagnosis not present

## 2017-06-18 DIAGNOSIS — M5441 Lumbago with sciatica, right side: Secondary | ICD-10-CM | POA: Diagnosis not present

## 2017-06-18 DIAGNOSIS — I10 Essential (primary) hypertension: Secondary | ICD-10-CM | POA: Diagnosis not present

## 2017-06-18 DIAGNOSIS — Z0000 Encounter for general adult medical examination without abnormal findings: Secondary | ICD-10-CM | POA: Diagnosis not present

## 2017-06-18 DIAGNOSIS — G8929 Other chronic pain: Secondary | ICD-10-CM | POA: Diagnosis not present

## 2017-06-20 DIAGNOSIS — S62025G Nondisplaced fracture of middle third of navicular [scaphoid] bone of left wrist, subsequent encounter for fracture with delayed healing: Secondary | ICD-10-CM | POA: Diagnosis not present

## 2017-06-21 ENCOUNTER — Other Ambulatory Visit: Payer: Self-pay | Admitting: Orthopedic Surgery

## 2017-06-21 DIAGNOSIS — S62025G Nondisplaced fracture of middle third of navicular [scaphoid] bone of left wrist, subsequent encounter for fracture with delayed healing: Secondary | ICD-10-CM

## 2017-06-24 ENCOUNTER — Ambulatory Visit: Payer: Medicare Other | Admitting: Adult Health

## 2017-06-25 ENCOUNTER — Ambulatory Visit
Admission: RE | Admit: 2017-06-25 | Discharge: 2017-06-25 | Disposition: A | Discharge status: Home / Self Care | Payer: Medicare Other | Source: Ambulatory Visit | Attending: Orthopedic Surgery | Admitting: Orthopedic Surgery

## 2017-06-25 DIAGNOSIS — S62025G Nondisplaced fracture of middle third of navicular [scaphoid] bone of left wrist, subsequent encounter for fracture with delayed healing: Secondary | ICD-10-CM

## 2017-06-25 DIAGNOSIS — M545 Low back pain: Secondary | ICD-10-CM | POA: Diagnosis not present

## 2017-06-25 DIAGNOSIS — S62012A Displaced fracture of distal pole of navicular [scaphoid] bone of left wrist, initial encounter for closed fracture: Secondary | ICD-10-CM | POA: Diagnosis not present

## 2017-06-27 DIAGNOSIS — S62025K Nondisplaced fracture of middle third of navicular [scaphoid] bone of left wrist, subsequent encounter for fracture with nonunion: Secondary | ICD-10-CM | POA: Diagnosis not present

## 2017-06-27 DIAGNOSIS — M545 Low back pain: Secondary | ICD-10-CM | POA: Diagnosis not present

## 2017-07-01 ENCOUNTER — Other Ambulatory Visit: Payer: Self-pay | Admitting: Orthopedic Surgery

## 2017-07-02 DIAGNOSIS — M545 Low back pain: Secondary | ICD-10-CM | POA: Diagnosis not present

## 2017-07-04 ENCOUNTER — Other Ambulatory Visit: Payer: Medicare Other

## 2017-07-04 DIAGNOSIS — M545 Low back pain: Secondary | ICD-10-CM | POA: Diagnosis not present

## 2017-07-05 ENCOUNTER — Other Ambulatory Visit: Payer: Self-pay | Admitting: Cardiology

## 2017-07-08 DIAGNOSIS — G4733 Obstructive sleep apnea (adult) (pediatric): Secondary | ICD-10-CM | POA: Diagnosis not present

## 2017-07-09 DIAGNOSIS — M545 Low back pain: Secondary | ICD-10-CM | POA: Diagnosis not present

## 2017-07-12 ENCOUNTER — Encounter (HOSPITAL_BASED_OUTPATIENT_CLINIC_OR_DEPARTMENT_OTHER): Payer: Self-pay | Admitting: *Deleted

## 2017-07-12 ENCOUNTER — Other Ambulatory Visit: Payer: Self-pay

## 2017-07-15 ENCOUNTER — Encounter (HOSPITAL_BASED_OUTPATIENT_CLINIC_OR_DEPARTMENT_OTHER)
Admission: RE | Admit: 2017-07-15 | Discharge: 2017-07-15 | Disposition: A | Discharge status: Home / Self Care | Payer: Medicare Other | Source: Ambulatory Visit | Attending: Orthopedic Surgery | Admitting: Orthopedic Surgery

## 2017-07-15 DIAGNOSIS — Z01812 Encounter for preprocedural laboratory examination: Secondary | ICD-10-CM | POA: Insufficient documentation

## 2017-07-15 LAB — BASIC METABOLIC PANEL
Anion gap: 12 (ref 5–15)
BUN: 16 mg/dL (ref 6–20)
CO2: 23 mmol/L (ref 22–32)
Calcium: 9.2 mg/dL (ref 8.9–10.3)
Chloride: 102 mmol/L (ref 101–111)
Creatinine, Ser: 1.36 mg/dL — ABNORMAL HIGH (ref 0.61–1.24)
GFR calc Af Amer: 56 mL/min — ABNORMAL LOW (ref >60)
GFR calc non Af Amer: 49 mL/min — ABNORMAL LOW (ref >60)
Glucose, Bld: 117 mg/dL — ABNORMAL HIGH (ref 65–99)
Potassium: 4.5 mmol/L (ref 3.5–5.1)
Sodium: 137 mmol/L (ref 135–145)

## 2017-07-16 DIAGNOSIS — M545 Low back pain: Secondary | ICD-10-CM | POA: Diagnosis not present

## 2017-07-18 NOTE — H&P (Signed)
Benjamin George is an 77 y.o. male.   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  He reports that his wrist remains painful, limiting his function  HPI 06-20-17: This patient returns reevaluation, continuing to use his forearm-based thumb spica splint.  He is in the midst of a bout of back pain, and reports that his left wrist pain persists.  He reports he has been using his bone stimulator.  HPI 05-22-17: This patient returns reevaluation, having been using his bone stimulator once a day for 20 minutes for about the past month.  He has remained in a forearm-based thumb spica splint mostly full-time with the cast.  He is eager to resume more vigorous activities with his Physiological scientist.  Past Medical History:  Diagnosis Date  . Arthritis   . Atrial fibrillation ([facility])   . Cardiac arrest ([facility])   . Complication of anesthesia   . Gout   . Gout   . Heart attack ([facility])   . Heart disease   . HLD (hyperlipidemia)   . HTN (hypertension)   . Memory disorder 12/21/2015  . PONV (postoperative nausea and vomiting)   . Sleep apnea    NPSG 10/26/07 - AHI 16.4 used5 yrs, uses CPAP nightly    Past Surgical History:  Procedure Laterality Date  . angioplasty    . APPENDECTOMY    . CAROTID STENT    . CORONARY ANGIOPLASTY WITH STENT PLACEMENT  06/27/2007   L main OK, LAD mild irreg, CFX stent OK, irreg, RCA 60% calcified, EF 45%   . HAND SURGERY     left   . LUMBAR LAMINECTOMY/DECOMPRESSION MICRODISCECTOMY N/A 05/06/2013   Procedure: Lumbar 4-5 decompression    1 LEVEL;  Surgeon: Sinclair Ship, MD;  Location: [facility];  Service: Orthopedics;  Laterality: N/A;  Lumbar 4-5 decompression  . nasal septopalsty    . ORIF SCAPHOID FRACTURE Left 01/21/2017   Procedure: OPEN TREATMENT OF LEFT  SCAPHOID FRACTURE;  Surgeon: Milly Jakob, MD;  Location: [facility];  Service: Orthopedics;  Laterality: Left;  . TOTAL SHOULDER ARTHROPLASTY  04/17/2011   Procedure: TOTAL SHOULDER ARTHROPLASTY; left Surgeon: Nita Sells, MD;  Location: [facility];  Service: Orthopedics;  Laterality: Left;    Family History  Problem Relation Age of Onset  . Heart attack Father   . Heart disease Father   . Hyperlipidemia Father   . Other Father        [name]  . Alzheimer's disease Mother   . Colon cancer Neg Hx   . Esophageal cancer Neg Hx   . Rectal cancer Neg Hx   . Stomach cancer Neg Hx    Social History:  reports that he quit smoking about 30 years ago. His smoking use included cigarettes. He has a 45.00 pack-year smoking history. He has never used smokeless tobacco. He reports that he drinks about 9.6 oz of alcohol per week. He reports that he does not use drugs.  Allergies:  Allergies  Allergen Reactions  . Codeine Nausea And Vomiting  . Hydrocodone Nausea And Vomiting  . Aricept [Donepezil Hcl]     Nausea  . Methocarbamol Nausea Only  . Other Nausea Only    vibramyacin    No medications prior to admission.    No results found for this or  any previous visit (from the past 48 hour(s)). No results found.  Review of Systems  All other systems reviewed and are negative.   Height 5\' 11"  (1.803 m), weight 112.5 kg (248 lb). Physical Exam  Constitutional:  WD, WN, NAD HEENT:  NCAT, EOMI Neuro/Psych:  Alert & oriented to person, place, and time; appropriate mood & affect Lymphatic: No generalized UE edema or lymphadenopathy Extremities / MSK:  Both UE are normal with respect to appearance, ranges of motion, joint stability, muscle strength/tone, sensation, & perfusion except as otherwise noted:  There remains some mild tenderness with pressure on the volar scaphoid tubercle, as well as in the snuffbox, but motion is only minimally diminished  Labs /  Xrays:  None today.  X-rays from 06-20-17: 4 views of the left wrist ordered and obtained today reveals interval screw migration, now resting proximally at least 2-3 mm from last x-rays.  Interestingly, there is some radiographic evidence that the scaphoid may have united, at least partially.  There is also some increased scapholunate widening  Assessment:  Left scaphoid nonunion, with loss of fixation   Plan: Today's findings were discussed with him.  I recommended removal of hardware with proximal row carpectomy.  I think it offers him the best opportunity for decent recovery with the least risk for complications.  I encouraged him to use his wrist splint full time between now and then to limit chondral consequences of screw migration.  The details of the operative procedure were discussed with the patient.  Questions were invited and answered.  In addition to the goal of the procedure, the risks of the procedure to include but not limited to bleeding; infection; damage to the nerves or blood vessels that could result in bleeding, numbness, weakness, chronic pain, and the need for additional procedures; stiffness; the need for revision surgery; and anesthetic risks were reviewed.  No specific outcome was guaranteed or implied.  Informed consent was obtained.  Jolyn Nap, MD 07/18/2017, 10:16 AM

## 2017-07-19 DIAGNOSIS — M545 Low back pain: Secondary | ICD-10-CM | POA: Diagnosis not present

## 2017-07-22 ENCOUNTER — Encounter (HOSPITAL_BASED_OUTPATIENT_CLINIC_OR_DEPARTMENT_OTHER): Payer: Self-pay | Admitting: Certified Registered"

## 2017-07-22 ENCOUNTER — Other Ambulatory Visit: Payer: Self-pay

## 2017-07-22 ENCOUNTER — Encounter (HOSPITAL_BASED_OUTPATIENT_CLINIC_OR_DEPARTMENT_OTHER)
Admission: RE | Disposition: A | Discharge status: Home / Self Care | Payer: Self-pay | Source: Ambulatory Visit | Attending: Orthopedic Surgery

## 2017-07-22 ENCOUNTER — Ambulatory Visit (HOSPITAL_BASED_OUTPATIENT_CLINIC_OR_DEPARTMENT_OTHER): Payer: Medicare Other | Admitting: Certified Registered"

## 2017-07-22 ENCOUNTER — Ambulatory Visit (HOSPITAL_COMMUNITY): Payer: Medicare Other

## 2017-07-22 ENCOUNTER — Ambulatory Visit (HOSPITAL_BASED_OUTPATIENT_CLINIC_OR_DEPARTMENT_OTHER)
Admission: RE | Admit: 2017-07-22 | Discharge: 2017-07-22 | Disposition: A | Discharge status: Home / Self Care | Payer: Medicare Other | Source: Ambulatory Visit | Attending: Orthopedic Surgery | Admitting: Orthopedic Surgery

## 2017-07-22 DIAGNOSIS — E785 Hyperlipidemia, unspecified: Secondary | ICD-10-CM | POA: Diagnosis not present

## 2017-07-22 DIAGNOSIS — S62032A Displaced fracture of proximal third of navicular [scaphoid] bone of left wrist, initial encounter for closed fracture: Secondary | ICD-10-CM | POA: Insufficient documentation

## 2017-07-22 DIAGNOSIS — Z8249 Family history of ischemic heart disease and other diseases of the circulatory system: Secondary | ICD-10-CM | POA: Diagnosis not present

## 2017-07-22 DIAGNOSIS — S62025K Nondisplaced fracture of middle third of navicular [scaphoid] bone of left wrist, subsequent encounter for fracture with nonunion: Secondary | ICD-10-CM | POA: Diagnosis not present

## 2017-07-22 DIAGNOSIS — Z472 Encounter for removal of internal fixation device: Secondary | ICD-10-CM | POA: Diagnosis not present

## 2017-07-22 DIAGNOSIS — Z96612 Presence of left artificial shoulder joint: Secondary | ICD-10-CM | POA: Insufficient documentation

## 2017-07-22 DIAGNOSIS — T84290A Other mechanical complication of internal fixation device of bones of hand and fingers, initial encounter: Secondary | ICD-10-CM | POA: Diagnosis not present

## 2017-07-22 DIAGNOSIS — Z955 Presence of coronary angioplasty implant and graft: Secondary | ICD-10-CM | POA: Insufficient documentation

## 2017-07-22 DIAGNOSIS — M25832 Other specified joint disorders, left wrist: Secondary | ICD-10-CM | POA: Diagnosis not present

## 2017-07-22 DIAGNOSIS — Z87891 Personal history of nicotine dependence: Secondary | ICD-10-CM | POA: Diagnosis not present

## 2017-07-22 DIAGNOSIS — M1990 Unspecified osteoarthritis, unspecified site: Secondary | ICD-10-CM | POA: Insufficient documentation

## 2017-07-22 DIAGNOSIS — G5632 Lesion of radial nerve, left upper limb: Secondary | ICD-10-CM | POA: Diagnosis not present

## 2017-07-22 DIAGNOSIS — R200 Anesthesia of skin: Secondary | ICD-10-CM | POA: Diagnosis not present

## 2017-07-22 DIAGNOSIS — G4730 Sleep apnea, unspecified: Secondary | ICD-10-CM | POA: Insufficient documentation

## 2017-07-22 DIAGNOSIS — I739 Peripheral vascular disease, unspecified: Secondary | ICD-10-CM | POA: Insufficient documentation

## 2017-07-22 DIAGNOSIS — M109 Gout, unspecified: Secondary | ICD-10-CM | POA: Diagnosis not present

## 2017-07-22 DIAGNOSIS — I4891 Unspecified atrial fibrillation: Secondary | ICD-10-CM | POA: Diagnosis not present

## 2017-07-22 DIAGNOSIS — M545 Low back pain: Secondary | ICD-10-CM | POA: Diagnosis not present

## 2017-07-22 DIAGNOSIS — Z885 Allergy status to narcotic agent status: Secondary | ICD-10-CM | POA: Diagnosis not present

## 2017-07-22 DIAGNOSIS — Z95820 Peripheral vascular angioplasty status with implants and grafts: Secondary | ICD-10-CM | POA: Diagnosis not present

## 2017-07-22 DIAGNOSIS — Z820 Family history of epilepsy and other diseases of the nervous system: Secondary | ICD-10-CM | POA: Insufficient documentation

## 2017-07-22 DIAGNOSIS — I10 Essential (primary) hypertension: Secondary | ICD-10-CM | POA: Diagnosis not present

## 2017-07-22 DIAGNOSIS — I493 Ventricular premature depolarization: Secondary | ICD-10-CM | POA: Insufficient documentation

## 2017-07-22 DIAGNOSIS — R413 Other amnesia: Secondary | ICD-10-CM | POA: Diagnosis not present

## 2017-07-22 DIAGNOSIS — G8918 Other acute postprocedural pain: Secondary | ICD-10-CM | POA: Diagnosis not present

## 2017-07-22 DIAGNOSIS — X58XXXA Exposure to other specified factors, initial encounter: Secondary | ICD-10-CM | POA: Diagnosis not present

## 2017-07-22 DIAGNOSIS — Y939 Activity, unspecified: Secondary | ICD-10-CM | POA: Insufficient documentation

## 2017-07-22 DIAGNOSIS — R531 Weakness: Secondary | ICD-10-CM | POA: Diagnosis not present

## 2017-07-22 DIAGNOSIS — I252 Old myocardial infarction: Secondary | ICD-10-CM | POA: Diagnosis not present

## 2017-07-22 DIAGNOSIS — Z888 Allergy status to other drugs, medicaments and biological substances status: Secondary | ICD-10-CM | POA: Insufficient documentation

## 2017-07-22 DIAGNOSIS — Z8489 Family history of other specified conditions: Secondary | ICD-10-CM | POA: Insufficient documentation

## 2017-07-22 DIAGNOSIS — Z6834 Body mass index (BMI) 34.0-34.9, adult: Secondary | ICD-10-CM | POA: Insufficient documentation

## 2017-07-22 DIAGNOSIS — I2510 Atherosclerotic heart disease of native coronary artery without angina pectoris: Secondary | ICD-10-CM | POA: Diagnosis not present

## 2017-07-22 DIAGNOSIS — Z419 Encounter for procedure for purposes other than remedying health state, unspecified: Secondary | ICD-10-CM

## 2017-07-22 HISTORY — PX: CARPECTOMY: SHX5004

## 2017-07-22 HISTORY — PX: HARDWARE REMOVAL: SHX979

## 2017-07-22 SURGERY — CARPECTOMY
Anesthesia: General | Site: Wrist | Laterality: Left

## 2017-07-22 MED ORDER — MIDAZOLAM HCL 2 MG/2ML IJ SOLN
INTRAMUSCULAR | Status: AC
  Filled 2017-07-22: qty 2

## 2017-07-22 MED ORDER — CEFAZOLIN SODIUM-DEXTROSE 2-4 GM/100ML-% IV SOLN
2.0000 g | INTRAVENOUS | Status: AC
  Administered 2017-07-22: 2 g via INTRAVENOUS

## 2017-07-22 MED ORDER — MIDAZOLAM HCL 2 MG/2ML IJ SOLN
1.0000 mg | INTRAMUSCULAR | Status: DC | PRN
  Administered 2017-07-22: 1 mg via INTRAVENOUS

## 2017-07-22 MED ORDER — SCOPOLAMINE 1 MG/3DAYS TD PT72: 1.0000 | MEDICATED_PATCH | Freq: Once | TRANSDERMAL | Status: DC | PRN

## 2017-07-22 MED ORDER — FENTANYL CITRATE (PF) 100 MCG/2ML IJ SOLN
INTRAMUSCULAR | Status: AC
  Filled 2017-07-22: qty 2

## 2017-07-22 MED ORDER — LIDOCAINE HCL (CARDIAC) 20 MG/ML IV SOLN
INTRAVENOUS | Status: DC | PRN
  Administered 2017-07-22: 30 mg via INTRAVENOUS

## 2017-07-22 MED ORDER — LACTATED RINGERS IV SOLN
INTRAVENOUS | Status: DC
  Administered 2017-07-22: 09:00:00 via INTRAVENOUS

## 2017-07-22 MED ORDER — ONDANSETRON HCL 4 MG/2ML IJ SOLN
INTRAMUSCULAR | Status: DC | PRN
  Administered 2017-07-22: 4 mg via INTRAVENOUS

## 2017-07-22 MED ORDER — FENTANYL CITRATE (PF) 100 MCG/2ML IJ SOLN
50.0000 ug | INTRAMUSCULAR | Status: DC | PRN
  Administered 2017-07-22: 25 ug via INTRAVENOUS
  Administered 2017-07-22: 50 ug via INTRAVENOUS

## 2017-07-22 MED ORDER — OXYCODONE HCL 5 MG PO TABS: 5.0000 mg | ORAL_TABLET | Freq: Four times a day (QID) | ORAL | 0 refills | Status: DC | PRN

## 2017-07-22 MED ORDER — ROPIVACAINE HCL 5 MG/ML IJ SOLN
INTRAMUSCULAR | Status: DC | PRN
  Administered 2017-07-22: 30 mL via PERINEURAL

## 2017-07-22 MED ORDER — PROPOFOL 500 MG/50ML IV EMUL
INTRAVENOUS | Status: DC | PRN
  Administered 2017-07-22: 50 ug/kg/min via INTRAVENOUS

## 2017-07-22 MED ORDER — CEFAZOLIN SODIUM-DEXTROSE 2-4 GM/100ML-% IV SOLN
INTRAVENOUS | Status: AC
  Filled 2017-07-22: qty 100

## 2017-07-22 MED ORDER — ONDANSETRON HCL 4 MG PO TABS: 4.0000 mg | ORAL_TABLET | Freq: Three times a day (TID) | ORAL | 0 refills | Status: DC | PRN

## 2017-07-22 MED ORDER — MELOXICAM 15 MG PO TABS: 15.0000 mg | ORAL_TABLET | Freq: Every day | ORAL | 2 refills | Status: DC

## 2017-07-22 MED ORDER — LACTATED RINGERS IV SOLN: INTRAVENOUS | Status: DC

## 2017-07-22 SURGICAL SUPPLY — 55 items
BANDAGE COBAN STERILE 2 (GAUZE/BANDAGES/DRESSINGS) IMPLANT
BLADE MINI RND TIP GREEN BEAV (BLADE) IMPLANT
BLADE SURG 15 STRL LF DISP TIS (BLADE) ×1 IMPLANT
BLADE SURG 15 STRL SS (BLADE) ×4
BNDG CMPR 9X4 STRL LF SNTH (GAUZE/BANDAGES/DRESSINGS) ×1
BNDG COHESIVE 4X5 TAN STRL (GAUZE/BANDAGES/DRESSINGS) ×2 IMPLANT
BNDG ESMARK 4X9 LF (GAUZE/BANDAGES/DRESSINGS) ×2 IMPLANT
BNDG GAUZE ELAST 4 BULKY (GAUZE/BANDAGES/DRESSINGS) ×2 IMPLANT
BRUSH SCRUB EZ PLAIN DRY (MISCELLANEOUS) IMPLANT
CANISTER SUCT 1200ML W/VALVE (MISCELLANEOUS) ×2 IMPLANT
CHLORAPREP W/TINT 26ML (MISCELLANEOUS) ×2 IMPLANT
CORD BIPOLAR FORCEPS 12FT (ELECTRODE) ×2 IMPLANT
COVER BACK TABLE 60X90IN (DRAPES) ×2 IMPLANT
COVER MAYO STAND STRL (DRAPES) ×2 IMPLANT
CUFF TOURNIQUET SINGLE 18IN (TOURNIQUET CUFF) ×1 IMPLANT
CUFF TOURNIQUET SINGLE 24IN (TOURNIQUET CUFF) IMPLANT
DRAPE C-ARM 42X72 X-RAY (DRAPES) ×2 IMPLANT
DRAPE EXTREMITY T 121X128X90 (DRAPE) ×2 IMPLANT
DRAPE SURG 17X23 STRL (DRAPES) ×2 IMPLANT
DRSG ADAPTIC 3X8 NADH LF (GAUZE/BANDAGES/DRESSINGS) ×2 IMPLANT
DRSG EMULSION OIL 3X3 NADH (GAUZE/BANDAGES/DRESSINGS) IMPLANT
ELECT REM PT RETURN 9FT ADLT (ELECTROSURGICAL) ×2
ELECTRODE REM PT RTRN 9FT ADLT (ELECTROSURGICAL) ×1 IMPLANT
GAUZE SPONGE 4X4 12PLY STRL LF (GAUZE/BANDAGES/DRESSINGS) ×2 IMPLANT
GLOVE BIO SURGEON STRL SZ7.5 (GLOVE) ×2 IMPLANT
GLOVE BIOGEL PI IND STRL 7.0 (GLOVE) ×1 IMPLANT
GLOVE BIOGEL PI IND STRL 8 (GLOVE) ×1 IMPLANT
GLOVE BIOGEL PI INDICATOR 7.0 (GLOVE) ×2
GLOVE BIOGEL PI INDICATOR 8 (GLOVE) ×1
GLOVE ECLIPSE 6.5 STRL STRAW (GLOVE) ×3 IMPLANT
GOWN STRL REUS W/ TWL LRG LVL3 (GOWN DISPOSABLE) ×2 IMPLANT
GOWN STRL REUS W/TWL LRG LVL3 (GOWN DISPOSABLE) ×4
GOWN STRL REUS W/TWL XL LVL3 (GOWN DISPOSABLE) ×2 IMPLANT
NDL HYPO 25X1 1.5 SAFETY (NEEDLE) IMPLANT
NEEDLE HYPO 25X1 1.5 SAFETY (NEEDLE) IMPLANT
NS IRRIG 1000ML POUR BTL (IV SOLUTION) ×2 IMPLANT
PACK BASIN DAY SURGERY FS (CUSTOM PROCEDURE TRAY) ×2 IMPLANT
PADDING CAST ABS 4INX4YD NS (CAST SUPPLIES) ×1
PADDING CAST ABS COTTON 4X4 ST (CAST SUPPLIES) IMPLANT
PENCIL BUTTON HOLSTER BLD 10FT (ELECTRODE) ×2 IMPLANT
RUBBERBAND STERILE (MISCELLANEOUS) IMPLANT
SLEEVE SCD COMPRESS KNEE MED (MISCELLANEOUS) ×1 IMPLANT
STOCKINETTE 6  STRL (DRAPES) ×1
STOCKINETTE 6 STRL (DRAPES) ×1 IMPLANT
SUT VIC AB 2-0 CT3 27 (SUTURE) ×2 IMPLANT
SUT VIC AB 2-0 PS2 27 (SUTURE) ×1 IMPLANT
SUT VICRYL 4-0 PS2 18IN ABS (SUTURE) IMPLANT
SUT VICRYL RAPIDE 4-0 (SUTURE) IMPLANT
SUT VICRYL RAPIDE 4/0 PS 2 (SUTURE) ×2 IMPLANT
SUT VICRYL+ 3-0 27IN RB-1 (SUTURE) IMPLANT
SYR 10ML LL (SYRINGE) IMPLANT
SYR BULB 3OZ (MISCELLANEOUS) ×2 IMPLANT
TOWEL OR 17X24 6PK STRL BLUE (TOWEL DISPOSABLE) ×2 IMPLANT
TOWEL OR NON WOVEN STRL DISP B (DISPOSABLE) ×1 IMPLANT
UNDERPAD 30X30 (UNDERPADS AND DIAPERS) ×2 IMPLANT

## 2017-07-22 NOTE — Transfer of Care (Signed)
Immediate Anesthesia Transfer of Care Note  Patient: Benjamin George  Procedure(s) Performed: LEFT WRIST HARDWARE REMOVAL AND PROXIMAL ROW CARPECTOMY (Left Wrist) HARDWARE REMOVAL (Left Wrist)  Patient Location: PACU  Anesthesia Type:MAC combined with regional for post-op pain  Level of Consciousness: awake, alert , oriented and patient cooperative  Airway & Oxygen Therapy: Patient Spontanous Breathing and Patient connected to face mask oxygen  Post-op Assessment: Report given to RN and Post -op Vital signs reviewed and stable  Post vital signs: Reviewed and stable  Last Vitals:  Vitals Value Taken Time  BP    Temp    Pulse 53 07/22/2017 10:36 AM  Resp 0 07/22/2017 10:36 AM  SpO2 98 % 07/22/2017 10:36 AM  Vitals shown include unvalidated device data.  Last Pain:  Vitals:   07/22/17 0905  TempSrc:   PainSc: 4       Patients Stated Pain Goal: 1 (40/97/35 3299)  Complications: No apparent anesthesia complications

## 2017-07-22 NOTE — Anesthesia Procedure Notes (Signed)
Procedure Name: MAC Date/Time: 07/22/2017 9:25 AM Performed by: Signe Colt, CRNA Pre-anesthesia Checklist: Patient identified, Emergency Drugs available, Suction available, Patient being monitored and Timeout performed Patient Re-evaluated:Patient Re-evaluated prior to induction Oxygen Delivery Method: Simple face mask

## 2017-07-22 NOTE — Anesthesia Postprocedure Evaluation (Signed)
Anesthesia Post Note  Patient: Benjamin George  Procedure(s) Performed: LEFT WRIST HARDWARE REMOVAL AND PROXIMAL ROW CARPECTOMY (Left Wrist) HARDWARE REMOVAL (Left Wrist)     Patient location during evaluation: PACU Anesthesia Type: MAC Level of consciousness: awake and alert Pain management: pain level controlled Vital Signs Assessment: post-procedure vital signs reviewed and stable Respiratory status: spontaneous breathing, nonlabored ventilation and respiratory function stable Cardiovascular status: stable and blood pressure returned to baseline Postop Assessment: no apparent nausea or vomiting Anesthetic complications: no    Last Vitals:  Vitals:   07/22/17 1130 07/22/17 1148  BP: (!) 119/47 (!) 150/57  Pulse: (!) 56 (!) 55  Resp: (!) 9 18  Temp:  36.6 C  SpO2: 94% 97%    Last Pain:  Vitals:   07/22/17 1148  TempSrc: Oral  PainSc: 7                  Lynda Rainwater

## 2017-07-22 NOTE — Interval H&P Note (Signed)
History and Physical Interval Note:  07/22/2017 8:07 AM  [place]  has presented today for surgery, with the diagnosis of LEFT SCAPHOID NONUNION S62.025K  The various methods of treatment have been discussed with the patient and family. After consideration of risks, benefits and other options for treatment, the patient has consented to  Procedure(s): LEFT WRIST HARDWARE REMOVAL AND PROXIMAL ROW CARPECTOMY (Left) as a surgical intervention .  The patient's history has been reviewed, patient examined, no change in status, stable for surgery.  I have reviewed the patient's chart and labs.  Questions were answered to the patient's satisfaction.     Jolyn Nap

## 2017-07-22 NOTE — Discharge Instructions (Signed)
°  Post Anesthesia Home Care Instructions  Activity: Get plenty of rest for the remainder of the day. A responsible individual must stay with you for 24 hours following the procedure.  For the next 24 hours, DO NOT: -Drive a car -Paediatric nurse -Drink alcoholic beverages -Take any medication unless instructed by your physician -Make any legal decisions or sign important papers.  Meals: Start with liquid foods such as gelatin or soup. Progress to regular foods as tolerated. Avoid greasy, spicy, heavy foods. If nausea and/or vomiting occur, drink only clear liquids until the nausea and/or vomiting subsides. Call your physician if vomiting continues.  Special Instructions/Symptoms: Your throat may feel dry or sore from the anesthesia or the breathing tube placed in your throat during surgery. If this causes discomfort, gargle with warm salt water. The discomfort should disappear within 24 hours.  If you had a scopolamine patch placed behind your ear for the management of post- operative nausea and/or vomiting:  1. The medication in the patch is effective for 72 hours, after which it should be removed.  Wrap patch in a tissue and discard in the trash. Wash hands thoroughly with soap and water. 2. You may remove the patch earlier than 72 hours if you experience unpleasant side effects which may include dry mouth, dizziness or visual disturbances. 3. Avoid touching the patch. Wash your hands with soap and water after contact with the patch.   Discharge Instructions   You have a dressing with a plaster splint incorporated in it. Move your fingers as much as possible, making a full fist and fully opening the fist. Elevate your hand to reduce pain & swelling of the digits.  Ice over the operative site may be helpful to reduce pain & swelling.  DO NOT USE HEAT. Pain medicine has been prescribed for you.  Take Meloxicam as stated on the prescription. Take Tylenol, 650 mg every 6 hours  consistently. Additionally, you may take Oxycodone 5 mg for severe breakthrough pain as a rescue medicine.  Zofran is for severe nausea and vomiting.  Leave the dressing in place until you return to our office.  You may shower, but keep the bandage clean & dry.  You may drive a car when you are off of prescription pain medications and can safely control your vehicle with both hands. NO USE OF THE LEFT WRIST/HAND except for pencil and paper tasks. Schedule an appointment with our office for 10-15 days from the date of surgery.   Please call [phone] during normal business hours or [phone] after hours for any problems. Including the following:  - excessive redness of the incisions - drainage for more than 4 days - fever of more than 101.5 F  *Please note that pain medications will not be refilled after hours or on weekends.

## 2017-07-22 NOTE — Anesthesia Preprocedure Evaluation (Addendum)
Anesthesia Evaluation  Patient identified by MRN, date of birth, ID band Patient awake    Reviewed: Allergy & Precautions, H&P , NPO status , Patient's Chart, lab work & pertinent test results, reviewed documented beta blocker date and time   History of Anesthesia Complications (+) PONV and history of anesthetic complications  Airway Mallampati: II  TM Distance: >3 FB Neck ROM: Full    Dental  (+) Teeth Intact   Pulmonary sleep apnea and Continuous Positive Airway Pressure Ventilation , former smoker,    Pulmonary exam normal        Cardiovascular Exercise Tolerance: Poor hypertension, Pt. on medications and Pt. on home beta blockers + CAD, + Past MI and + Peripheral Vascular Disease  Normal cardiovascular exam+ dysrhythmias  Rhythm:Regular Rate:Normal  Few PACs and PVCs   Neuro/Psych Lower back pain with numbness and weakness down right leg  Neuromuscular disease negative psych ROS   GI/Hepatic negative GI ROS, Neg liver ROS,   Endo/Other  neg diabetesMorbid obesity  Renal/GU negative Renal ROS  negative genitourinary   Musculoskeletal  (+) Arthritis , Osteoarthritis,    Abdominal (+) + obese,   Peds  Hematology   Anesthesia Other Findings   Reproductive/Obstetrics                             Anesthesia Physical  Anesthesia Plan  ASA: III  Anesthesia Plan: MAC   Post-op Pain Management:  Regional for Post-op pain   Induction: Intravenous  PONV Risk Score and Plan: 3 and Ondansetron, Dexamethasone, Treatment may vary due to age or medical condition and Midazolam  Airway Management Planned: LMA  Additional Equipment: None  Intra-op Plan:   Post-operative Plan:   Informed Consent: I have reviewed the patients History and Physical, chart, labs and discussed the procedure including the risks, benefits and alternatives for the proposed anesthesia with the patient or authorized  representative who has indicated his/her understanding and acceptance.   Dental advisory given  Plan Discussed with: CRNA and Surgeon  Anesthesia Plan Comments: ( )       Anesthesia Quick Evaluation

## 2017-07-22 NOTE — Op Note (Signed)
07/22/2017  8:11 AM  PATIENT:  Benjamin George  77 y.o. male  PRE-OPERATIVE DIAGNOSIS:  Left scaphoid nonunion with retained hardware  POST-OPERATIVE DIAGNOSIS:  Same  PROCEDURE:  Left wrist hardware removal, proximal row carpectomy, and PIN neurectomy  SURGEON: Rayvon Char. Grandville Silos, MD  PHYSICIAN ASSISTANT: Morley Kos, OPA-C  ANESTHESIA:  regional and general  SPECIMENS:  Removed screw and removed and discarded proximal carpal row  DRAINS:   None  EBL:  less than 50 mL  PREOPERATIVE INDICATIONS:  Benjamin George is a  77 y.o. male with a failed ORIF of a left scaphoid fracture with pain.  The risks benefits and alternatives were discussed with the patient preoperatively including but not limited to the risks of infection, bleeding, nerve injury, cardiopulmonary complications, the need for revision surgery, among others, and the patient verbalized understanding and consented to proceed.  OPERATIVE IMPLANTS: none  OPERATIVE PROCEDURE:  After receiving prophylactic antibiotics and a regional block, the patient was escorted to the operative theatre and placed in a supine position.  He was sedated.  A surgical "time-out" was performed during which the planned procedure, proposed operative site, and the correct patient identity were compared to the operative consent and agreement confirmed by the circulating nurse according to current facility policy.  Following application of a tourniquet to the operative extremity, the exposed skin was prepped with Chloraprep and draped in the usual sterile fashion.  The limb was exsanguinated with an Esmarch bandage and the tourniquet inflated to approximately 182mmHg higher than systolic BP.  The previous small longitudinal incision was incorporated into a longer more zigzag incision on the dorsum of the wrist.  Skin flaps were elevated full-thickness.  The extensor retinaculum was entered along the ulnar side of the third compartment, allowing the  fourth compartment to read be reflected ulnarly and transposing the third compartment tendon radially.  In the floor of the fourth compartment, the posterior interosseous nerve was identified and was transected more proximal in the floor of the compartment, near the muscle tendon junctions.  A V-shaped ligament sparing capsulotomy was made, exposing the proximal row.  The scaphoid was excised first, allowing removal of the screw in the process.  A radial styloidectomy was performed to prevent radial sided impingement postoperatively.  The lunate and the triquetrum more than each excised in turn piecemeal.  Care was taken to protect the cartilage on the head of the capitate as well as what remained of the distal radius.  There was some chondral changes more in the scaphoid fossa, some of which were excised with the styloidectomy.  Images were obtained to reveal satisfactory alignment of the proximal row.  The wrist could not be dislocated.  The wrist joint was then irrigated copiously and the capsule closed with 2-0 Vicryl interrupted sutures.  The tendons were replaced to their native alignment and retinacular repair performed with the same suture type.  Tourniquet was released, some additional hemostasis obtained, and the skin was reapproximated with 4-0 Vicryl Rapide interrupted horizontal mattress sutures.  Short arm splint dressing was applied, and he was taken to the recovery room in stable condition, breathing spontaneously  DISPOSITION: He will be discharged home today with typical instructions, returning in 10-15 days for reevaluation, no x-rays required.  At that time we will decide whether to return to a removable immobilization or a short period of formal short arm casting.

## 2017-07-22 NOTE — Anesthesia Procedure Notes (Signed)
Anesthesia Regional Block: Supraclavicular block   Pre-Anesthetic Checklist: ,, timeout performed, Correct Patient, Correct Site, Correct Laterality, Correct Procedure, Correct Position, site marked, Risks and benefits discussed,  Surgical consent,  Pre-op evaluation,  At surgeon's request and post-op pain management  Laterality: Left  Prep: chloraprep       Needles:  Injection technique: Single-shot  Needle Type: Stimiplex     Needle Length: 9cm  Needle Gauge: 21     Additional Needles:   Procedures:,,,, ultrasound used (permanent image in chart),,,,  Narrative:  Start time: 07/22/2017 8:59 AM End time: 07/22/2017 9:04 AM Injection made incrementally with aspirations every 5 mL.  Performed by: Personally  Anesthesiologist: Lynda Rainwater, MD

## 2017-07-22 NOTE — Progress Notes (Signed)
Assisted Dr. Miller with left, ultrasound guided, supraclavicular block. Side rails up, monitors on throughout procedure. See vital signs in flow sheet. Tolerated Procedure well. 

## 2017-07-23 ENCOUNTER — Encounter (HOSPITAL_BASED_OUTPATIENT_CLINIC_OR_DEPARTMENT_OTHER): Payer: Self-pay | Admitting: Orthopedic Surgery

## 2017-08-06 DIAGNOSIS — S62025K Nondisplaced fracture of middle third of navicular [scaphoid] bone of left wrist, subsequent encounter for fracture with nonunion: Secondary | ICD-10-CM | POA: Diagnosis not present

## 2017-08-07 DIAGNOSIS — G4733 Obstructive sleep apnea (adult) (pediatric): Secondary | ICD-10-CM | POA: Diagnosis not present

## 2017-08-08 DIAGNOSIS — M545 Low back pain: Secondary | ICD-10-CM | POA: Diagnosis not present

## 2017-08-13 DIAGNOSIS — M545 Low back pain: Secondary | ICD-10-CM | POA: Diagnosis not present

## 2017-08-14 DIAGNOSIS — G4733 Obstructive sleep apnea (adult) (pediatric): Secondary | ICD-10-CM | POA: Diagnosis not present

## 2017-08-19 DIAGNOSIS — M5136 Other intervertebral disc degeneration, lumbar region: Secondary | ICD-10-CM | POA: Diagnosis not present

## 2017-08-19 DIAGNOSIS — G894 Chronic pain syndrome: Secondary | ICD-10-CM | POA: Diagnosis not present

## 2017-08-19 DIAGNOSIS — M5137 Other intervertebral disc degeneration, lumbosacral region: Secondary | ICD-10-CM | POA: Diagnosis not present

## 2017-08-19 DIAGNOSIS — M47816 Spondylosis without myelopathy or radiculopathy, lumbar region: Secondary | ICD-10-CM | POA: Diagnosis not present

## 2017-08-27 DIAGNOSIS — S62025K Nondisplaced fracture of middle third of navicular [scaphoid] bone of left wrist, subsequent encounter for fracture with nonunion: Secondary | ICD-10-CM | POA: Diagnosis not present

## 2017-09-05 DIAGNOSIS — M545 Low back pain: Secondary | ICD-10-CM | POA: Diagnosis not present

## 2017-09-07 DIAGNOSIS — G4733 Obstructive sleep apnea (adult) (pediatric): Secondary | ICD-10-CM | POA: Diagnosis not present

## 2017-09-25 DIAGNOSIS — M961 Postlaminectomy syndrome, not elsewhere classified: Secondary | ICD-10-CM | POA: Diagnosis not present

## 2017-09-25 DIAGNOSIS — M47816 Spondylosis without myelopathy or radiculopathy, lumbar region: Secondary | ICD-10-CM | POA: Diagnosis not present

## 2017-09-25 DIAGNOSIS — M5137 Other intervertebral disc degeneration, lumbosacral region: Secondary | ICD-10-CM | POA: Diagnosis not present

## 2017-10-07 DIAGNOSIS — G4733 Obstructive sleep apnea (adult) (pediatric): Secondary | ICD-10-CM | POA: Diagnosis not present

## 2017-10-08 DIAGNOSIS — S62025K Nondisplaced fracture of middle third of navicular [scaphoid] bone of left wrist, subsequent encounter for fracture with nonunion: Secondary | ICD-10-CM | POA: Diagnosis not present

## 2017-10-09 DIAGNOSIS — M545 Low back pain: Secondary | ICD-10-CM | POA: Diagnosis not present

## 2017-10-14 DIAGNOSIS — M47816 Spondylosis without myelopathy or radiculopathy, lumbar region: Secondary | ICD-10-CM | POA: Diagnosis not present

## 2017-10-14 DIAGNOSIS — G894 Chronic pain syndrome: Secondary | ICD-10-CM | POA: Diagnosis not present

## 2017-10-14 DIAGNOSIS — M79606 Pain in leg, unspecified: Secondary | ICD-10-CM | POA: Diagnosis not present

## 2017-10-15 DIAGNOSIS — M545 Low back pain: Secondary | ICD-10-CM | POA: Diagnosis not present

## 2017-10-16 DIAGNOSIS — G894 Chronic pain syndrome: Secondary | ICD-10-CM | POA: Diagnosis not present

## 2017-10-16 DIAGNOSIS — M5136 Other intervertebral disc degeneration, lumbar region: Secondary | ICD-10-CM | POA: Diagnosis not present

## 2017-10-16 DIAGNOSIS — M961 Postlaminectomy syndrome, not elsewhere classified: Secondary | ICD-10-CM | POA: Diagnosis not present

## 2017-10-16 DIAGNOSIS — M47816 Spondylosis without myelopathy or radiculopathy, lumbar region: Secondary | ICD-10-CM | POA: Diagnosis not present

## 2017-10-22 ENCOUNTER — Other Ambulatory Visit: Payer: Self-pay | Admitting: Adult Health

## 2017-10-24 DIAGNOSIS — G4733 Obstructive sleep apnea (adult) (pediatric): Secondary | ICD-10-CM | POA: Diagnosis not present

## 2017-11-07 DIAGNOSIS — G4733 Obstructive sleep apnea (adult) (pediatric): Secondary | ICD-10-CM | POA: Diagnosis not present

## 2017-11-13 DIAGNOSIS — M545 Low back pain: Secondary | ICD-10-CM | POA: Diagnosis not present

## 2017-11-15 DIAGNOSIS — M545 Low back pain: Secondary | ICD-10-CM | POA: Diagnosis not present

## 2017-11-16 ENCOUNTER — Other Ambulatory Visit: Payer: Self-pay | Admitting: Adult Health

## 2017-11-18 NOTE — Telephone Encounter (Signed)
Refill submitted to Memantine 10 mg for a 90 day supply to the [facility] on [address]. MB RN.

## 2017-11-21 DIAGNOSIS — M545 Low back pain: Secondary | ICD-10-CM | POA: Diagnosis not present

## 2017-11-22 DIAGNOSIS — M545 Low back pain: Secondary | ICD-10-CM | POA: Diagnosis not present

## 2017-11-26 DIAGNOSIS — Z883 Allergy status to other anti-infective agents status: Secondary | ICD-10-CM | POA: Diagnosis not present

## 2017-11-26 DIAGNOSIS — Z466 Encounter for fitting and adjustment of urinary device: Secondary | ICD-10-CM | POA: Diagnosis not present

## 2017-11-26 DIAGNOSIS — M5136 Other intervertebral disc degeneration, lumbar region: Secondary | ICD-10-CM | POA: Diagnosis not present

## 2017-11-26 DIAGNOSIS — Z79899 Other long term (current) drug therapy: Secondary | ICD-10-CM | POA: Diagnosis not present

## 2017-11-26 DIAGNOSIS — M1990 Unspecified osteoarthritis, unspecified site: Secondary | ICD-10-CM | POA: Diagnosis not present

## 2017-11-26 DIAGNOSIS — Z955 Presence of coronary angioplasty implant and graft: Secondary | ICD-10-CM | POA: Diagnosis not present

## 2017-11-26 DIAGNOSIS — G4730 Sleep apnea, unspecified: Secondary | ICD-10-CM | POA: Diagnosis not present

## 2017-11-26 DIAGNOSIS — E785 Hyperlipidemia, unspecified: Secondary | ICD-10-CM | POA: Diagnosis not present

## 2017-11-26 DIAGNOSIS — G894 Chronic pain syndrome: Secondary | ICD-10-CM | POA: Diagnosis not present

## 2017-11-26 DIAGNOSIS — M5137 Other intervertebral disc degeneration, lumbosacral region: Secondary | ICD-10-CM | POA: Diagnosis not present

## 2017-11-26 DIAGNOSIS — M545 Low back pain: Secondary | ICD-10-CM | POA: Diagnosis not present

## 2017-11-26 DIAGNOSIS — G4733 Obstructive sleep apnea (adult) (pediatric): Secondary | ICD-10-CM | POA: Diagnosis not present

## 2017-11-26 DIAGNOSIS — Z87891 Personal history of nicotine dependence: Secondary | ICD-10-CM | POA: Diagnosis not present

## 2017-11-26 DIAGNOSIS — Z885 Allergy status to narcotic agent status: Secondary | ICD-10-CM | POA: Diagnosis not present

## 2017-11-26 DIAGNOSIS — Z96612 Presence of left artificial shoulder joint: Secondary | ICD-10-CM | POA: Diagnosis not present

## 2017-11-26 DIAGNOSIS — M961 Postlaminectomy syndrome, not elsewhere classified: Secondary | ICD-10-CM | POA: Diagnosis not present

## 2017-11-26 DIAGNOSIS — I252 Old myocardial infarction: Secondary | ICD-10-CM | POA: Diagnosis not present

## 2017-11-26 DIAGNOSIS — M79659 Pain in unspecified thigh: Secondary | ICD-10-CM | POA: Diagnosis not present

## 2017-11-26 DIAGNOSIS — I2510 Atherosclerotic heart disease of native coronary artery without angina pectoris: Secondary | ICD-10-CM | POA: Diagnosis not present

## 2017-11-26 DIAGNOSIS — I10 Essential (primary) hypertension: Secondary | ICD-10-CM | POA: Diagnosis not present

## 2017-11-27 DIAGNOSIS — C61 Malignant neoplasm of prostate: Secondary | ICD-10-CM | POA: Diagnosis not present

## 2017-12-03 DIAGNOSIS — K5901 Slow transit constipation: Secondary | ICD-10-CM | POA: Diagnosis not present

## 2017-12-08 DIAGNOSIS — G4733 Obstructive sleep apnea (adult) (pediatric): Secondary | ICD-10-CM | POA: Diagnosis not present

## 2017-12-12 ENCOUNTER — Encounter

## 2017-12-12 ENCOUNTER — Encounter: Payer: Self-pay | Admitting: Adult Health

## 2017-12-12 ENCOUNTER — Ambulatory Visit: Payer: Medicare Other | Admitting: Adult Health

## 2017-12-12 VITALS — BP 122/68 | HR 58 | Ht 71.0 in | Wt 247.0 lb

## 2017-12-12 DIAGNOSIS — R413 Other amnesia: Secondary | ICD-10-CM | POA: Diagnosis not present

## 2017-12-12 NOTE — Patient Instructions (Signed)
Your Plan:  Continue Namenda If your symptoms worsen or you develop new symptoms please let us know.   Thank you for coming to see us at [facility]. I hope we have been able to provide you high quality care today.  You may receive a patient satisfaction survey over the next few weeks. We would appreciate your feedback and comments so that we may continue to improve ourselves and the health of our patients.  

## 2017-12-12 NOTE — Progress Notes (Signed)
PATIENT: Benjamin George DOB: June 14, 1940  REASON FOR VISIT: follow up HISTORY FROM: patient  HISTORY OF PRESENT ILLNESS: Today 12/12/17: Mr. [name] is a 77 year old male with a history of memory disturbance.  He returns today for follow-up.  He remains on Namenda.  He continues to live at home with his spouse.  He is able to complete all ADLs independently.  He operates a Teacher, music.  Reports good appetite.  Denies any trouble sleeping.  Denies any changes in his mood or behavior.  Denies hallucinations.  He continues to help the finances.  He manages his own medications and appointments.  He recently had surgery for spinal stimulator implant.  He returns today for evaluation.  HISTORY 12/24/16 Mr. [name] is a 77 year old male with a history of memory disturbance. He returns today for follow-up. He is currently on Namenda and tolerating it well. He is able to complete all ADLs independently. He operates a Teacher, music without difficulty. Continues to use his GPS. He still helps with the finances. Denies any trouble sleeping other than he wakes 4 times a night to urinate. Denies any changes in his mood or behavior. Denies agitation and aggressiveness. Denies hallucinations. He returns today for an evaluation.  REVIEW OF SYSTEMS: Out of a complete 14 system review of symptoms, the patient complains only of the following symptoms, and all other reviewed systems are negative.  See HPI  ALLERGIES: Allergies  Allergen Reactions  . Codeine Nausea And Vomiting  . Hydrocodone Nausea And Vomiting  . Aricept [Donepezil Hcl]     Nausea  . Methocarbamol Nausea Only  . Other Nausea Only    vibramyacin    HOME MEDICATIONS: Outpatient Medications Prior to Visit  Medication Sig Dispense Refill  . acetaminophen (TYLENOL) 325 MG tablet Take 2 tablets (650 mg total) by mouth every 6 (six) hours as needed for mild pain or moderate pain.    Marland Kitchen allopurinol (ZYLOPRIM) 100 MG tablet Take 100 mg by  mouth every morning.     Marland Kitchen aspirin 81 MG tablet Take 81 mg by mouth daily.    Marland Kitchen atenolol (TENORMIN) 50 MG tablet Take 1 tablet (50 mg total) by mouth daily. 90 tablet 3  . atorvastatin (LIPITOR) 20 MG tablet TAKE 1 TABLET BY MOUTH EVERY DAY 90 tablet 3  . CINNAMON PO Take 1,000 mg by mouth daily.    . Coenzyme Q10 200 MG capsule Take 200 mg by mouth daily.    . isosorbide mononitrate (IMDUR) 30 MG 24 hr tablet Take 1 tablet (30 mg total) by mouth daily. 90 tablet 2  . LINZESS 290 MCG CAPS capsule 290 mcg.    . losartan-hydrochlorothiazide (HYZAAR) 100-12.5 MG tablet TAKE 1 TABLET BY MOUTH EVERY DAY (Patient taking differently: TAKE 1/2 TABLET BY MOUTH EVERY DAY) 90 tablet 2  . meclizine (ANTIVERT) 25 MG tablet Take 25 mg by mouth 2 (two) times daily as needed (for vertigo).    . meloxicam (MOBIC) 15 MG tablet Take 15 mg by mouth daily. with food  2  . meloxicam (MOBIC) 15 MG tablet Take 1 tablet (15 mg total) by mouth daily. 30 tablet 2  . memantine (NAMENDA) 10 MG tablet TAKE 1 TABLET BY MOUTH TWICE A DAY 180 tablet 2  . Multiple Vitamin (MULTIVITAMIN) tablet Take 1 tablet by mouth every morning.     . ondansetron (ZOFRAN) 4 MG tablet Take 1 tablet (4 mg total) by mouth every 8 (eight) hours as needed for nausea or  vomiting. 20 tablet 0  . ondansetron (ZOFRAN) 4 MG tablet Take 1 tablet (4 mg total) by mouth every 8 (eight) hours as needed for nausea or vomiting. 20 tablet 0  . oxyCODONE (ROXICODONE) 5 MG immediate release tablet Take 1 tablet (5 mg total) by mouth every 6 (six) hours as needed for breakthrough pain. 20 tablet 0  . traMADol (ULTRAM) 50 MG tablet Take by mouth every 6 (six) hours as needed.    . vitamin B-12 (CYANOCOBALAMIN) 100 MCG tablet Take 200 mcg by mouth every morning.     . vitamin C (ASCORBIC ACID) 500 MG tablet Take 500 mg by mouth every morning.     No facility-administered medications prior to visit.     PAST MEDICAL HISTORY: Past Medical History:  Diagnosis Date   . Arthritis   . Atrial fibrillation ([place])   . Cardiac arrest ([place])   . Complication of anesthesia   . Gout   . Gout   . Heart attack ([place])   . Heart disease   . HLD (hyperlipidemia)   . HTN (hypertension)   . Memory disorder 12/21/2015  . PONV (postoperative nausea and vomiting)   . Sleep apnea    NPSG 10/26/07 - AHI 16.4 used5 yrs, uses CPAP nightly    PAST SURGICAL HISTORY: Past Surgical History:  Procedure Laterality Date  . angioplasty    . APPENDECTOMY    . CAROTID STENT    . CARPECTOMY Left 07/22/2017   Procedure: LEFT WRIST HARDWARE REMOVAL AND PROXIMAL ROW CARPECTOMY;  Surgeon: Milly Jakob, MD;  Location: [facility];  Service: Orthopedics;  Laterality: Left;  . CORONARY ANGIOPLASTY WITH STENT PLACEMENT  06/27/2007   L main OK, LAD mild irreg, CFX stent OK, irreg, RCA 60% calcified, EF 45%   . HAND SURGERY     left   . HARDWARE REMOVAL Left 07/22/2017   Procedure: HARDWARE REMOVAL;  Surgeon: Milly Jakob, MD;  Location: [facility];  Service: Orthopedics;  Laterality: Left;  . LUMBAR LAMINECTOMY/DECOMPRESSION MICRODISCECTOMY N/A 05/06/2013   Procedure: Lumbar 4-5 decompression    1 LEVEL;  Surgeon: Sinclair Ship, MD;  Location: [facility];  Service: Orthopedics;  Laterality: N/A;  Lumbar 4-5 decompression  . nasal septopalsty    . ORIF SCAPHOID FRACTURE Left 01/21/2017   Procedure: OPEN TREATMENT OF LEFT SCAPHOID FRACTURE;  Surgeon: Milly Jakob, MD;  Location: [facility];  Service: Orthopedics;  Laterality: Left;  . TOTAL SHOULDER ARTHROPLASTY  04/17/2011   Procedure: TOTAL SHOULDER ARTHROPLASTY; left Surgeon: Nita Sells, MD;  Location: [facility];  Service: Orthopedics;  Laterality: Left;    FAMILY HISTORY: Family History  Problem Relation Age of Onset  . Heart attack Father   . Heart disease Father   . Hyperlipidemia Father   . Other Father        Benjamin George  . Alzheimer's disease Mother   .  Colon cancer Neg Hx   . Esophageal cancer Neg Hx   . Rectal cancer Neg Hx   . Stomach cancer Neg Hx     SOCIAL HISTORY: Social History   Socioeconomic History  . Marital status: Married    Spouse name: Not on file  . Number of children: 2  . Years of education: College/tech  . Highest education level: Not on file  Occupational History  . Occupation: Retired  Scientific laboratory technician  . Financial resource strain: Not on file  . Food insecurity:    Worry: Not  on file    Inability: Not on file  . Transportation needs:    Medical: Not on file    Non-medical: Not on file  Tobacco Use  . Smoking status: Former Smoker    Packs/day: 1.50    Years: 30.00    Pack years: 45.00    Types: Cigarettes    Last attempt to quit: 04/28/1987    Years since quitting: 30.6  . Smokeless tobacco: Never Used  . Tobacco comment: smoked 2 ppd for 32 years; quit in 1989  Substance and Sexual Activity  . Alcohol use: Yes    Alcohol/week: 16.0 standard drinks    Types: 14 Cans of beer, 2 Shots of liquor per week    Comment: [place] of beers per day  . Drug use: No  . Sexual activity: Not on file    Comment: Married  Lifestyle  . Physical activity:    Days per week: Not on file    Minutes per session: Not on file  . Stress: Not on file  Relationships  . Social connections:    Talks on phone: Not on file    Gets together: Not on file    Attends religious service: Not on file    Active member of club or organization: Not on file    Attends meetings of clubs or organizations: Not on file    Relationship status: Not on file  . Intimate partner violence:    Fear of current or ex partner: Not on file    Emotionally abused: Not on file    Physically abused: Not on file    Forced sexual activity: Not on file  Other Topics Concern  . Not on file  Social History Narrative   Married, 2 children.    Retired VP at [employer]:   12/12/17 0734  BP: 122/68  Pulse: (!) 58    Weight: 247 lb (112 kg)  Height: 5\' 11"  (1.803 m)   Body mass index is 34.59 kg/m.   MMSE - Mini Mental State Exam 12/12/2017 12/24/2016 06/20/2016  Orientation to time 0 3 4  Orientation to Place 5 4 5   Registration 3 3 3   Attention/ Calculation 5 5 5   Recall 2 2 0  Language- name 2 objects 2 2 2   Language- repeat 0 0 1  Language- follow 3 step command 3 3 3   Language- read & follow direction 1 1 1   Write a sentence 1 1 1   Copy design 0 1 1  Total score 22 25 26      Generalized: Well developed, in no acute distress   Neurological examination  Mentation: Alert Follows all commands speech and language fluent Cranial nerve II-XII: Pupils were equal round reactive to light. Extraocular movements were full, visual field were full on confrontational test. Facial sensation and strength were normal. Uvula tongue midline. Head turning and shoulder shrug  were normal and symmetric. Motor: The motor testing reveals 5 over 5 strength of all 4 extremities. Good symmetric motor tone is noted throughout.  Sensory: Sensory testing is intact to soft touch on all 4 extremities. No evidence of extinction is noted.  Coordination: Cerebellar testing reveals good finger-nose-finger and heel-to-shin bilaterally.  Gait and station: Patient uses a cane when ambulating.  Gait is slightly unsteady.  Tandem gait not attempted..  Reflexes: Deep tendon reflexes are symmetric and normal bilaterally.   DIAGNOSTIC DATA (LABS, IMAGING, TESTING) - I reviewed patient records,  labs, notes, testing and imaging myself where available.  Lab Results  Component Value Date   WBC 7.9 04/27/2013   HGB 13.7 04/27/2013   HCT 39.3 04/27/2013   MCV 92.9 04/27/2013   PLT 222 04/27/2013      Component Value Date/Time   NA 137 07/15/2017 1200   K 4.5 07/15/2017 1200   CL 102 07/15/2017 1200   CO2 23 07/15/2017 1200   GLUCOSE 117 (H) 07/15/2017 1200   BUN 16 07/15/2017 1200   CREATININE 1.36 (H) 07/15/2017 1200    CREATININE 1.35 (H) 03/16/2016 1653   CALCIUM 9.2 07/15/2017 1200   PROT 7.0 04/27/2013 0940   ALBUMIN 3.8 04/27/2013 0940   AST 18 04/27/2013 0940   ALT 20 04/27/2013 0940   ALKPHOS 60 04/27/2013 0940   BILITOT 0.4 04/27/2013 0940   GFRNONAA 49 (L) 07/15/2017 1200   GFRAA 56 (L) 07/15/2017 1200   Lab Results  Component Value Date   CHOL 155 03/16/2016   HDL 45 03/16/2016   LDLCALC 54 03/16/2016   TRIG 279 (H) 03/16/2016   CHOLHDL 3.4 03/16/2016    ASSESSMENT AND PLAN 77 y.o. year old male  has a past medical history of Arthritis, Atrial fibrillation ([place]), Cardiac arrest ([place]), Complication of anesthesia, Gout, Gout, Heart attack ([place]), Heart disease, HLD (hyperlipidemia), HTN (hypertension), Memory disorder (12/21/2015), PONV (postoperative nausea and vomiting), and Sleep apnea. here with:  1.  Memory disturbance  The patient's memory score has slightly decreased.  We will continue to monitor.  He will continue on Namenda.  He was unable to tolerate Aricept in the past.  I have advised that if his symptoms worsen or he develops new symptoms he should let us know.  He will follow-up in 6 months or sooner if needed.   I spent 15 minutes with the patient. 50% of this time was spent reviewing his memory score   Ward Givens, MSN, NP-C 12/12/2017, 7:32 AM [facility] [address] [place], [place] [address] [phone]

## 2017-12-12 NOTE — Progress Notes (Signed)
I have read the note, and I agree with the clinical assessment and plan.  Charles K Willis   

## 2017-12-13 DIAGNOSIS — I10 Essential (primary) hypertension: Secondary | ICD-10-CM | POA: Diagnosis not present

## 2017-12-13 DIAGNOSIS — D649 Anemia, unspecified: Secondary | ICD-10-CM | POA: Diagnosis not present

## 2017-12-19 DIAGNOSIS — Z23 Encounter for immunization: Secondary | ICD-10-CM | POA: Diagnosis not present

## 2017-12-19 DIAGNOSIS — I10 Essential (primary) hypertension: Secondary | ICD-10-CM | POA: Diagnosis not present

## 2017-12-19 DIAGNOSIS — G8929 Other chronic pain: Secondary | ICD-10-CM | POA: Diagnosis not present

## 2017-12-19 DIAGNOSIS — R739 Hyperglycemia, unspecified: Secondary | ICD-10-CM | POA: Diagnosis not present

## 2017-12-19 DIAGNOSIS — D649 Anemia, unspecified: Secondary | ICD-10-CM | POA: Diagnosis not present

## 2018-01-07 DIAGNOSIS — G4733 Obstructive sleep apnea (adult) (pediatric): Secondary | ICD-10-CM | POA: Diagnosis not present

## 2018-01-08 DIAGNOSIS — Z9889 Other specified postprocedural states: Secondary | ICD-10-CM | POA: Diagnosis not present

## 2018-01-08 DIAGNOSIS — M79606 Pain in leg, unspecified: Secondary | ICD-10-CM | POA: Diagnosis not present

## 2018-01-08 DIAGNOSIS — G894 Chronic pain syndrome: Secondary | ICD-10-CM | POA: Diagnosis not present

## 2018-01-08 DIAGNOSIS — M47816 Spondylosis without myelopathy or radiculopathy, lumbar region: Secondary | ICD-10-CM | POA: Diagnosis not present

## 2018-01-22 DIAGNOSIS — M79606 Pain in leg, unspecified: Secondary | ICD-10-CM | POA: Diagnosis not present

## 2018-01-22 DIAGNOSIS — M5136 Other intervertebral disc degeneration, lumbar region: Secondary | ICD-10-CM | POA: Diagnosis not present

## 2018-01-22 DIAGNOSIS — M47816 Spondylosis without myelopathy or radiculopathy, lumbar region: Secondary | ICD-10-CM | POA: Diagnosis not present

## 2018-01-22 DIAGNOSIS — G894 Chronic pain syndrome: Secondary | ICD-10-CM | POA: Diagnosis not present

## 2018-02-07 DIAGNOSIS — G4733 Obstructive sleep apnea (adult) (pediatric): Secondary | ICD-10-CM | POA: Diagnosis not present

## 2018-02-07 DIAGNOSIS — M545 Low back pain: Secondary | ICD-10-CM | POA: Diagnosis not present

## 2018-02-14 DIAGNOSIS — M545 Low back pain: Secondary | ICD-10-CM | POA: Diagnosis not present

## 2018-02-17 DIAGNOSIS — M545 Low back pain: Secondary | ICD-10-CM | POA: Diagnosis not present

## 2018-02-19 DIAGNOSIS — M961 Postlaminectomy syndrome, not elsewhere classified: Secondary | ICD-10-CM | POA: Diagnosis not present

## 2018-02-19 DIAGNOSIS — G894 Chronic pain syndrome: Secondary | ICD-10-CM | POA: Diagnosis not present

## 2018-02-19 DIAGNOSIS — M5136 Other intervertebral disc degeneration, lumbar region: Secondary | ICD-10-CM | POA: Diagnosis not present

## 2018-02-19 DIAGNOSIS — T85123A Displacement of implanted electronic neurostimulator, generator, initial encounter: Secondary | ICD-10-CM | POA: Diagnosis not present

## 2018-02-21 DIAGNOSIS — M545 Low back pain: Secondary | ICD-10-CM | POA: Diagnosis not present

## 2018-03-10 DIAGNOSIS — G4733 Obstructive sleep apnea (adult) (pediatric): Secondary | ICD-10-CM | POA: Diagnosis not present

## 2018-03-13 DIAGNOSIS — M545 Low back pain: Secondary | ICD-10-CM | POA: Diagnosis not present

## 2018-03-13 DIAGNOSIS — G4733 Obstructive sleep apnea (adult) (pediatric): Secondary | ICD-10-CM | POA: Diagnosis not present

## 2018-03-13 DIAGNOSIS — E785 Hyperlipidemia, unspecified: Secondary | ICD-10-CM | POA: Diagnosis not present

## 2018-03-13 DIAGNOSIS — Z885 Allergy status to narcotic agent status: Secondary | ICD-10-CM | POA: Diagnosis not present

## 2018-03-13 DIAGNOSIS — Z87891 Personal history of nicotine dependence: Secondary | ICD-10-CM | POA: Diagnosis not present

## 2018-03-13 DIAGNOSIS — T85192A Other mechanical complication of implanted electronic neurostimulator (electrode) of spinal cord, initial encounter: Secondary | ICD-10-CM | POA: Diagnosis not present

## 2018-03-13 DIAGNOSIS — I10 Essential (primary) hypertension: Secondary | ICD-10-CM | POA: Diagnosis not present

## 2018-03-13 DIAGNOSIS — Z888 Allergy status to other drugs, medicaments and biological substances status: Secondary | ICD-10-CM | POA: Diagnosis not present

## 2018-03-13 DIAGNOSIS — T85112A Breakdown (mechanical) of implanted electronic neurostimulator (electrode) of spinal cord, initial encounter: Secondary | ICD-10-CM | POA: Diagnosis not present

## 2018-03-13 DIAGNOSIS — M961 Postlaminectomy syndrome, not elsewhere classified: Secondary | ICD-10-CM | POA: Diagnosis not present

## 2018-03-13 DIAGNOSIS — G894 Chronic pain syndrome: Secondary | ICD-10-CM | POA: Diagnosis not present

## 2018-03-13 DIAGNOSIS — I252 Old myocardial infarction: Secondary | ICD-10-CM | POA: Diagnosis not present

## 2018-03-13 DIAGNOSIS — T85123A Displacement of implanted electronic neurostimulator, generator, initial encounter: Secondary | ICD-10-CM | POA: Diagnosis not present

## 2018-03-13 DIAGNOSIS — Z462 Encounter for fitting and adjustment of other devices related to nervous system and special senses: Secondary | ICD-10-CM | POA: Diagnosis not present

## 2018-03-17 ENCOUNTER — Encounter: Payer: Self-pay | Admitting: Cardiology

## 2018-03-18 DIAGNOSIS — G4733 Obstructive sleep apnea (adult) (pediatric): Secondary | ICD-10-CM | POA: Diagnosis not present

## 2018-04-18 ENCOUNTER — Ambulatory Visit: Payer: Medicare Other | Admitting: Cardiology

## 2018-04-23 DIAGNOSIS — G894 Chronic pain syndrome: Secondary | ICD-10-CM | POA: Diagnosis not present

## 2018-04-23 DIAGNOSIS — M47816 Spondylosis without myelopathy or radiculopathy, lumbar region: Secondary | ICD-10-CM | POA: Diagnosis not present

## 2018-04-23 DIAGNOSIS — M5136 Other intervertebral disc degeneration, lumbar region: Secondary | ICD-10-CM | POA: Diagnosis not present

## 2018-04-23 DIAGNOSIS — M5137 Other intervertebral disc degeneration, lumbosacral region: Secondary | ICD-10-CM | POA: Diagnosis not present

## 2018-04-26 ENCOUNTER — Other Ambulatory Visit: Payer: Self-pay | Admitting: Cardiology

## 2018-04-28 NOTE — Telephone Encounter (Signed)
Dr Aundra Dubin is no longer this patients cardiologist and has not been seen by MD since 2017

## 2018-04-30 ENCOUNTER — Encounter: Payer: Self-pay | Admitting: Cardiology

## 2018-04-30 ENCOUNTER — Ambulatory Visit: Payer: Medicare Other | Admitting: Cardiology

## 2018-04-30 VITALS — BP 128/70 | HR 71 | Ht 71.0 in | Wt 236.8 lb

## 2018-04-30 DIAGNOSIS — E785 Hyperlipidemia, unspecified: Secondary | ICD-10-CM | POA: Diagnosis not present

## 2018-04-30 DIAGNOSIS — I10 Essential (primary) hypertension: Secondary | ICD-10-CM

## 2018-04-30 DIAGNOSIS — I25118 Atherosclerotic heart disease of native coronary artery with other forms of angina pectoris: Secondary | ICD-10-CM

## 2018-04-30 DIAGNOSIS — I255 Ischemic cardiomyopathy: Secondary | ICD-10-CM | POA: Diagnosis not present

## 2018-04-30 NOTE — Progress Notes (Signed)
Cardiology Office Note:    Date:  04/30/2018   ID:  Benjamin George, DOB 1940/10/30, MRN [id number]  PCP:  [name], MD  Cardiologist:  Candee Furbish, MD  Electrophysiologist:  None   Referring MD: [name], MD     History of Present Illness:    Benjamin George is a 78 y.o. male former patient of Dr. Saunders Revel here for follow-up of coronary artery disease status post PCI's to the RCA and circumflex with normalization of EF 65% in 2012, hypertension, obesity, obstructive sleep apnea.  Used to see Dr. Aundra Dubin as well as Dr. Minette Brine.  Doing well.  Some bilateral thigh pain when walking previously.  Low back pain, left arm pain.  Fractured his left wrist previously.  Has experienced in the past rare sharp shooting pains across his chest lasting 1 second duration.  Had a successful weight loss last year of over 15 pounds.  Frequent urination at night.  Denies any fevers chills nausea vomiting syncope.   Married, [name], retired from [employer].  Overall doing quite well.  Enjoys the [place].  Goes to [place] sometimes to see baseball games with 3 other individuals.   Past Medical History:  Diagnosis Date  . Arthritis   . Atrial fibrillation ([place])   . Cardiac arrest ([place])   . Complication of anesthesia   . Gout   . Gout   . Heart attack ([place])   . Heart disease   . HLD (hyperlipidemia)   . HTN (hypertension)   . Memory disorder 12/21/2015  . PONV (postoperative nausea and vomiting)   . Sleep apnea    NPSG 10/26/07 - AHI 16.4 used5 yrs, uses CPAP nightly    Past Surgical History:  Procedure Laterality Date  . angioplasty    . APPENDECTOMY    . CAROTID STENT    . CARPECTOMY Left 07/22/2017   Procedure: LEFT WRIST HARDWARE REMOVAL AND PROXIMAL ROW CARPECTOMY;  Surgeon: Milly Jakob, MD;  Location: [facility];  Service: Orthopedics;  Laterality: Left;  . CORONARY ANGIOPLASTY WITH STENT PLACEMENT  06/27/2007   L main OK, LAD mild irreg, CFX stent OK,  irreg, RCA 60% calcified, EF 45%   . HAND SURGERY     left   . HARDWARE REMOVAL Left 07/22/2017   Procedure: HARDWARE REMOVAL;  Surgeon: Milly Jakob, MD;  Location: [facility];  Service: Orthopedics;  Laterality: Left;  . LUMBAR LAMINECTOMY/DECOMPRESSION MICRODISCECTOMY N/A 05/06/2013   Procedure: Lumbar 4-5 decompression    1 LEVEL;  Surgeon: Sinclair Ship, MD;  Location: [facility];  Service: Orthopedics;  Laterality: N/A;  Lumbar 4-5 decompression  . nasal septopalsty    . ORIF SCAPHOID FRACTURE Left 01/21/2017   Procedure: OPEN TREATMENT OF LEFT SCAPHOID FRACTURE;  Surgeon: Milly Jakob, MD;  Location: [facility];  Service: Orthopedics;  Laterality: Left;  . TOTAL SHOULDER ARTHROPLASTY  04/17/2011   Procedure: TOTAL SHOULDER ARTHROPLASTY; left Surgeon: Nita Sells, MD;  Location: [facility];  Service: Orthopedics;  Laterality: Left;    Current Medications: Current Meds  Medication Sig  . acetaminophen (TYLENOL) 325 MG tablet Take 2 tablets (650 mg total) by mouth every 6 (six) hours as needed for mild pain or moderate pain.  [place] Kitchen allopurinol (ZYLOPRIM) 100 MG tablet Take 100 mg by mouth every morning.   Mar[place] Kitchen aspirin 81 MG tablet Take 81 mg by mouth daily.  Marland Kitchen atenolol (TENORMIN) 50 MG tablet Take 1 tablet (50 mg total)  by mouth daily.  Marland Kitchen atorvastatin (LIPITOR) 20 MG tablet TAKE 1 TABLET BY MOUTH EVERY DAY  . CINNAMON PO Take 1,000 mg by mouth daily.  . Coenzyme Q10 200 MG capsule Take 200 mg by mouth daily.  . isosorbide mononitrate (IMDUR) 30 MG 24 hr tablet Take 1 tablet (30 mg total) by mouth daily.  Marland Kitchen LINZESS 290 MCG CAPS capsule 290 mcg.  . losartan-hydrochlorothiazide (HYZAAR) 100-12.5 MG tablet TAKE 1 TABLET BY MOUTH EVERY DAY (Patient taking differently: 1 tablet. )  . meclizine (ANTIVERT) 25 MG tablet Take 25 mg by mouth 2 (two) times daily as needed (for vertigo).  . memantine (NAMENDA) 10 MG tablet TAKE 1 TABLET BY MOUTH TWICE A DAY  .  Multiple Vitamin (MULTIVITAMIN) tablet Take 1 tablet by mouth every morning.   . ondansetron (ZOFRAN) 4 MG tablet Take 1 tablet (4 mg total) by mouth every 8 (eight) hours as needed for nausea or vomiting.  . traMADol (ULTRAM) 50 MG tablet Take by mouth every 6 (six) hours as needed.  . vitamin B-12 (CYANOCOBALAMIN) 100 MCG tablet Take 200 mcg by mouth every morning.   . vitamin C (ASCORBIC ACID) 500 MG tablet Take 500 mg by mouth every morning.     Allergies:   Codeine; Hydrocodone; Aricept [donepezil hcl]; Methocarbamol; and Other   Social History   Socioeconomic History  . Marital status: Married    Spouse name: Not on file  . Number of children: 2  . Years of education: College/tech  . Highest education level: Not on file  Occupational History  . Occupation: Retired  Scientific laboratory technician  . Financial resource strain: Not on file  . Food insecurity:    Worry: Not on file    Inability: Not on file  . Transportation needs:    Medical: Not on file    Non-medical: Not on file  Tobacco Use  . Smoking status: Former Smoker    Packs/day: 1.50    Years: 30.00    Pack years: 45.00    Types: Cigarettes    Last attempt to quit: 04/28/1987    Years since quitting: 31.0  . Smokeless tobacco: Never Used  . Tobacco comment: smoked 2 ppd for 32 years; quit in 1989  Substance and Sexual Activity  . Alcohol use: Yes    Alcohol/week: 16.0 standard drinks    Types: 14 Cans of beer, 2 Shots of liquor per week    Comment: [place] of beers per day  . Drug use: No  . Sexual activity: Not on file    Comment: Married  Lifestyle  . Physical activity:    Days per week: Not on file    Minutes per session: Not on file  . Stress: Not on file  Relationships  . Social connections:    Talks on phone: Not on file    Gets together: Not on file    Attends religious service: Not on file    Active member of club or organization: Not on file    Attends meetings of clubs or organizations: Not on file     Relationship status: Not on file  Other Topics Concern  . Not on file  Social History Narrative   Married, 2 children.    Retired VP at [employer] History: The patient's family history includes Alzheimer's disease in his mother; Heart attack in his father; Heart disease in his father; Hyperlipidemia in his father; Other in his father. There is no  history of Colon cancer, Esophageal cancer, Rectal cancer, or Stomach cancer.  ROS:   Please see the history of present illness.     All other systems reviewed and are negative.  EKGs/Labs/Other Studies Reviewed:    The following studies were reviewed today:  ECHO 2012:  - Left ventricle: The cavity size was normal. Wall thickness was increased in a pattern of mild LVH. There was mild focal basal hypertrophy of the septum. Systolic function was normal. The estimated ejection fraction was in the range of 55% to 65%. Wall motion was normal; there were no regional wall motion abnormalities. Doppler parameters are consistent with abnormal left ventricular relaxation (grade 1 diastolic dysfunction). - Aortic valve: Mild regurgitation. - Mitral valve: Mild regurgitation. - Left atrium: The atrium was mildly dilated.  Normal ABIs 04/24/2016  EKG:  EKG is  ordered today.  The ekg ordered today demonstrates 04/30/2018-sinus rhythm first-degree AV block 244 ms otherwise unremarkable, mild baseline artifact noted inferior leads.  Personally reviewed and interpreted.  Prior EKG showed sinus rhythm with first-degree AV block otherwise normal without any changing.  Recent Labs: 07/15/2017: BUN 16; Creatinine, Ser 1.36; Potassium 4.5; Sodium 137  Recent Lipid Panel    Component Value Date/Time   CHOL 155 03/16/2016 1653   TRIG 279 (H) 03/16/2016 1653   HDL 45 03/16/2016 1653   CHOLHDL 3.4 03/16/2016 1653   VLDL 56 (H) 03/16/2016 1653   LDLCALC 54 03/16/2016 1653    Physical Exam:    VS:  BP 128/70   Pulse 71   Ht 5'  11" (1.803 m)   Wt 236 lb 12.8 oz (107.4 kg)   SpO2 97%   BMI 33.03 kg/m     Wt Readings from Last 3 Encounters:  04/30/18 236 lb 12.8 oz (107.4 kg)  12/12/17 247 lb (112 kg)  07/22/17 248 lb (112.5 kg)     GEN: obese, uses cane Well nourished, well developed in no acute distress HEENT: Normal NECK: No JVD; No carotid bruits LYMPHATICS: No lymphadenopathy CARDIAC: RRR, no murmurs, rubs, gallops RESPIRATORY:  Clear to auscultation without rales, wheezing or rhonchi .  Noted increased respiratory effort when walking from scale to exam room ABDOMEN: Soft, non-tender, non-distended MUSCULOSKELETAL:  No edema; No deformity  SKIN: Warm and dry NEUROLOGIC:  Alert and oriented x 3 PSYCHIATRIC:  Normal affect   ASSESSMENT:    1. Coronary artery disease of native artery of native heart with stable angina pectoris ([place])   2. Ischemic cardiomyopathy   3. Essential hypertension   4. Hyperlipidemia LDL goal <70    PLAN:    In order of problems listed above:  Coronary artery disease with stable angina - Medications reviewed.  Atypical anginal symptoms with sharp shooting chest pain previously.  Continue with atenolol and isosorbide for antianginal therapy.  Doing a good job.  Low-dose aspirin. - Prior stent placement to the circumflex as well as RCA.  Stable.  Ischemic cardiomyopathy - At last cardiac catheterization EF had returned to normal.  Continue with both beta-blocker as well as angiotensin receptor blocker, losartan.  NYHA class 2.  Continue to encourage weight loss.  Essential hypertension -Overall well controlled blood pressure.  No med changes.  Hyperlipidemia -LDL 72 previously.  Previously, increasing atorvastatin had been discussed but he was hesitant.  Agreed to leave him at 20 mg a day previously.  Encourage weight loss, diet modifications.  Obesity - He has lost about 15 pounds.  Excellent.   Medication Adjustments/Labs and  Tests Ordered: Current medicines are  reviewed at length with the patient today.  Concerns regarding medicines are outlined above.  Orders Placed This Encounter  Procedures  . EKG 12-Lead   No orders of the defined types were placed in this encounter.   Patient Instructions  Medication Instructions:  The current medical regimen is effective;  continue present plan and medications.  If you need a refill on your cardiac medications before your next appointment, please call your pharmacy.   Follow-Up: At [facility], you and your health needs are our priority.  As part of our continuing mission to provide you with exceptional heart care, we have created designated Provider Care Teams.  These Care Teams include your primary Cardiologist (physician) and Advanced Practice Providers (APPs -  Physician Assistants and Nurse Practitioners) who all work together to provide you with the care you need, when you need it. You will need a follow up appointment in 12 months.  Please call our office 2 months in advance to schedule this appointment.  You may see Candee Furbish, MD or one of the following Advanced Practice Providers on your designated Care Team:   Truitt Merle, NP Cecilie Kicks, NP . Kathyrn Drown, NP  Thank you for choosing [facility]!!           Signed, Candee Furbish, MD  04/30/2018 11:12 AM    [place]

## 2018-04-30 NOTE — Patient Instructions (Signed)
Medication Instructions:  The current medical regimen is effective;  continue present plan and medications.  If you need a refill on your cardiac medications before your next appointment, please call your pharmacy.   Follow-Up: At [facility], you and your health needs are our priority.  As part of our continuing mission to provide you with exceptional heart care, we have created designated Provider Care Teams.  These Care Teams include your primary Cardiologist (physician) and Advanced Practice Providers (APPs -  Physician Assistants and Nurse Practitioners) who all work together to provide you with the care you need, when you need it. You will need a follow up appointment in 12 months.  Please call our office 2 months in advance to schedule this appointment.  You may see Mark Skains, MD or one of the following Advanced Practice Providers on your designated Care Team:   Lori Gerhardt, NP Laura Ingold, NP . Jill McDaniel, NP  Thank you for choosing [facility]!!      

## 2018-05-05 ENCOUNTER — Encounter: Payer: Self-pay | Admitting: Internal Medicine

## 2018-05-06 ENCOUNTER — Encounter: Payer: Self-pay | Admitting: Internal Medicine

## 2018-05-06 ENCOUNTER — Ambulatory Visit (INDEPENDENT_AMBULATORY_CARE_PROVIDER_SITE_OTHER): Payer: Medicare Other | Admitting: Internal Medicine

## 2018-05-06 VITALS — BP 118/68 | HR 74 | Ht 72.0 in | Wt 242.8 lb

## 2018-05-06 DIAGNOSIS — I2510 Atherosclerotic heart disease of native coronary artery without angina pectoris: Secondary | ICD-10-CM

## 2018-05-06 DIAGNOSIS — G4733 Obstructive sleep apnea (adult) (pediatric): Secondary | ICD-10-CM

## 2018-05-06 DIAGNOSIS — R413 Other amnesia: Secondary | ICD-10-CM | POA: Diagnosis not present

## 2018-05-06 NOTE — Assessment & Plan Note (Signed)
He expresses concern about memory loss.  At this point become a bigger issue.  He is being followed elsewhere for this problem.

## 2018-05-06 NOTE — Patient Instructions (Signed)
We can continue CPAP auto 5-15, mask of choice, humidifier, supplies, AirView or card  Order- schedule PFT  Dx former tobacco user, CAD  Please call if we can help

## 2018-05-06 NOTE — Assessment & Plan Note (Signed)
He has a significant past smoking history, now ended.  Physical activity is limited and he is overweight and deconditioned, with coronary disease.  Defacing future back surgeries.  I suggested it would be good to have a pulmonary function test results on his file and he agreed. Plan-schedule PFT

## 2018-05-06 NOTE — Progress Notes (Signed)
HPI male former smoker followed for management of OSA, complicated by allergic rhinitis, CAD/ MI, morbid obesity, insomnia NPSG-10/16/2007-AHI 16.4/hour, desaturation to 83%. Periodic limb movement with arousal 8.2/hour  --------------------------------------------------------------------------------- 05/06/17- 78 year old male former smoker followed for management of OSA, complicated by allergic rhinitis, CAD/ MI, morbid obesity, insomnia CPAP 10/Advanced Pt is doing well overall with cpap machine. Would like to know if he could get new machine. No download available He reports good compliance and comfortable sleep using CPAP despite discomforts related to chronic back pain and to a slowly healing left wrist fracture.  Uses full facemask.  05/06/2018- 78 year old male former smoker followed for management of OSA, complicated by allergic rhinitis, CAD/ MI, morbid obesity, insomnia CPAP auto 5-15/Advanced -----OSA-good with CPAP Download 93% compliance AHI 3.4/hour. "I would try to sleep without CPAP now".  He denies discomfort or concerns.  We discussed pressure settings. I asked about his past smoking history.  He admits occasional cough and wheeze.  Activity now limited by back and leg pain-using cane.  Some dyspnea with ADLs.  He agrees to PFT and says his PCP has checked CXR. He is not sure if he is facing more back surgery.  Says cardiac status is stable.  Expresses concerns about his memory.  ROS-see HPI   + = positive Constitutional:    weight loss, night sweats, fevers, chills, fatigue, lassitude. HEENT:    headaches, difficulty swallowing, tooth/dental problems, sore throat,       sneezing, itching, ear ache, nasal congestion, post nasal drip, snoring CV:    chest pain, orthopnea, PND, swelling in lower extremities, anasarca,                                                         dizziness, palpitations Resp:   shortness of breath with exertion or at rest.                productive  cough,  + non-productive cough, coughing up of blood.              change in color of mucus.  +wheezing- minimal   Skin:    rash or lesions. GI:  No-   heartburn, indigestion, abdominal pain, nausea, vomiting, diarrhea,                 change in bowel habits, loss of appetite GU: dysuria, change in color of urine, no urgency or frequency.   flank pain. MS:   joint pain, stiffness, decreased range of motion, +back pain. Neuro-     + concerned about memory Psych:  change in mood or affect.  depression or anxiety.   memory loss.  OBJ- Physical Exam General- Alert, Oriented, Affect-appropriate, Distress- none acute, + Obese Skin- rash-none, lesions- none, excoriation- none Lymphadenopathy- none Head- atraumatic            Eyes- Gross vision intact, PERRLA, conjunctivae and secretions clear            Ears- Hearing, canals-normal            Nose- Clear, no-Septal dev, mucus, polyps, erosion, perforation             Throat- Mallampati IV , mucosa clear , drainage- none, tonsils- atrophic Neck- flexible , trachea midline, no stridor , thyroid nl, carotid no bruit Chest -  symmetrical excursion , unlabored           Heart/CV- RRR , no murmur , no gallop  , no rub, nl s1 s2                           - JVD- none , edema- none, stasis changes- none, varices- none           Lung-clear/unlabored, wheeze- none, cough- none , dullness-none, rub- none           Chest wall-  Abd-  Br/ Gen/ Rectal- Not done, not indicated Extrem- cyanosis- none, clubbing, none, atrophy- none, strength- nl, + cane Neuro- grossly intact to observation

## 2018-05-06 NOTE — Assessment & Plan Note (Signed)
He continues to benefit from CPAP with improved sleep quality.  Download confirms excellent compliance and control. Plan-continue CPAP auto 5-15

## 2018-05-09 ENCOUNTER — Other Ambulatory Visit: Payer: Self-pay | Admitting: Internal Medicine

## 2018-05-12 ENCOUNTER — Emergency Department (HOSPITAL_COMMUNITY): Payer: Medicare Other

## 2018-05-12 ENCOUNTER — Encounter (HOSPITAL_COMMUNITY): Payer: Self-pay | Admitting: *Deleted

## 2018-05-12 ENCOUNTER — Inpatient Hospital Stay (HOSPITAL_COMMUNITY)
Admission: EM | Admit: 2018-05-12 | Discharge: 2018-05-17 | DRG: 195 | Disposition: A | Discharge status: Home Health | Payer: Medicare Other | Attending: Internal Medicine | Admitting: Internal Medicine

## 2018-05-12 ENCOUNTER — Other Ambulatory Visit: Payer: Self-pay

## 2018-05-12 DIAGNOSIS — Z820 Family history of epilepsy and other diseases of the nervous system: Secondary | ICD-10-CM

## 2018-05-12 DIAGNOSIS — J189 Pneumonia, unspecified organism: Secondary | ICD-10-CM | POA: Diagnosis not present

## 2018-05-12 DIAGNOSIS — Z8349 Family history of other endocrine, nutritional and metabolic diseases: Secondary | ICD-10-CM | POA: Diagnosis not present

## 2018-05-12 DIAGNOSIS — R531 Weakness: Secondary | ICD-10-CM | POA: Diagnosis not present

## 2018-05-12 DIAGNOSIS — Z79899 Other long term (current) drug therapy: Secondary | ICD-10-CM

## 2018-05-12 DIAGNOSIS — G4700 Insomnia, unspecified: Secondary | ICD-10-CM | POA: Diagnosis not present

## 2018-05-12 DIAGNOSIS — J181 Lobar pneumonia, unspecified organism: Secondary | ICD-10-CM

## 2018-05-12 DIAGNOSIS — E785 Hyperlipidemia, unspecified: Secondary | ICD-10-CM | POA: Diagnosis not present

## 2018-05-12 DIAGNOSIS — N390 Urinary tract infection, site not specified: Secondary | ICD-10-CM | POA: Diagnosis not present

## 2018-05-12 DIAGNOSIS — I2510 Atherosclerotic heart disease of native coronary artery without angina pectoris: Secondary | ICD-10-CM | POA: Diagnosis not present

## 2018-05-12 DIAGNOSIS — Z8674 Personal history of sudden cardiac arrest: Secondary | ICD-10-CM | POA: Diagnosis not present

## 2018-05-12 DIAGNOSIS — R55 Syncope and collapse: Secondary | ICD-10-CM | POA: Diagnosis not present

## 2018-05-12 DIAGNOSIS — Z7982 Long term (current) use of aspirin: Secondary | ICD-10-CM

## 2018-05-12 DIAGNOSIS — J302 Other seasonal allergic rhinitis: Secondary | ICD-10-CM | POA: Diagnosis not present

## 2018-05-12 DIAGNOSIS — R413 Other amnesia: Secondary | ICD-10-CM | POA: Diagnosis not present

## 2018-05-12 DIAGNOSIS — Z888 Allergy status to other drugs, medicaments and biological substances status: Secondary | ICD-10-CM

## 2018-05-12 DIAGNOSIS — I110 Hypertensive heart disease with heart failure: Secondary | ICD-10-CM | POA: Diagnosis not present

## 2018-05-12 DIAGNOSIS — I959 Hypotension, unspecified: Secondary | ICD-10-CM

## 2018-05-12 DIAGNOSIS — Z9682 Presence of neurostimulator: Secondary | ICD-10-CM | POA: Diagnosis not present

## 2018-05-12 DIAGNOSIS — Z955 Presence of coronary angioplasty implant and graft: Secondary | ICD-10-CM | POA: Diagnosis not present

## 2018-05-12 DIAGNOSIS — M109 Gout, unspecified: Secondary | ICD-10-CM | POA: Diagnosis not present

## 2018-05-12 DIAGNOSIS — Z885 Allergy status to narcotic agent status: Secondary | ICD-10-CM

## 2018-05-12 DIAGNOSIS — G8929 Other chronic pain: Secondary | ICD-10-CM | POA: Diagnosis not present

## 2018-05-12 DIAGNOSIS — I252 Old myocardial infarction: Secondary | ICD-10-CM

## 2018-05-12 DIAGNOSIS — Z87891 Personal history of nicotine dependence: Secondary | ICD-10-CM | POA: Diagnosis not present

## 2018-05-12 DIAGNOSIS — Z79891 Long term (current) use of opiate analgesic: Secondary | ICD-10-CM

## 2018-05-12 DIAGNOSIS — M5127 Other intervertebral disc displacement, lumbosacral region: Secondary | ICD-10-CM | POA: Diagnosis not present

## 2018-05-12 DIAGNOSIS — G4733 Obstructive sleep apnea (adult) (pediatric): Secondary | ICD-10-CM | POA: Diagnosis present

## 2018-05-12 DIAGNOSIS — R410 Disorientation, unspecified: Secondary | ICD-10-CM | POA: Diagnosis not present

## 2018-05-12 DIAGNOSIS — Z8249 Family history of ischemic heart disease and other diseases of the circulatory system: Secondary | ICD-10-CM | POA: Diagnosis not present

## 2018-05-12 DIAGNOSIS — Z96612 Presence of left artificial shoulder joint: Secondary | ICD-10-CM | POA: Diagnosis present

## 2018-05-12 DIAGNOSIS — I509 Heart failure, unspecified: Secondary | ICD-10-CM | POA: Diagnosis not present

## 2018-05-12 DIAGNOSIS — R0902 Hypoxemia: Secondary | ICD-10-CM | POA: Diagnosis not present

## 2018-05-12 DIAGNOSIS — R42 Dizziness and giddiness: Secondary | ICD-10-CM | POA: Diagnosis not present

## 2018-05-12 DIAGNOSIS — R509 Fever, unspecified: Secondary | ICD-10-CM | POA: Diagnosis not present

## 2018-05-12 DIAGNOSIS — R918 Other nonspecific abnormal finding of lung field: Secondary | ICD-10-CM | POA: Diagnosis not present

## 2018-05-12 DIAGNOSIS — I255 Ischemic cardiomyopathy: Secondary | ICD-10-CM | POA: Diagnosis not present

## 2018-05-12 DIAGNOSIS — I10 Essential (primary) hypertension: Secondary | ICD-10-CM | POA: Diagnosis not present

## 2018-05-12 DIAGNOSIS — F0390 Unspecified dementia without behavioral disturbance: Secondary | ICD-10-CM | POA: Diagnosis present

## 2018-05-12 LAB — SEDIMENTATION RATE: Sed Rate: 8 mm/hr (ref 0–16)

## 2018-05-12 LAB — CBC WITH DIFFERENTIAL/PLATELET
Abs Immature Granulocytes: 0.05 10*3/uL (ref 0.00–0.07)
Basophils Absolute: 0 10*3/uL (ref 0.0–0.1)
Basophils Relative: 0 %
Eosinophils Absolute: 0.2 10*3/uL (ref 0.0–0.5)
Eosinophils Relative: 2 %
HCT: 38.8 % — ABNORMAL LOW (ref 39.0–52.0)
Hemoglobin: 12.9 g/dL — ABNORMAL LOW (ref 13.0–17.0)
Immature Granulocytes: 1 %
Lymphocytes Relative: 12 %
Lymphs Abs: 1.2 10*3/uL (ref 0.7–4.0)
MCH: 31.4 pg (ref 26.0–34.0)
MCHC: 33.2 g/dL (ref 30.0–36.0)
MCV: 94.4 fL (ref 80.0–100.0)
Monocytes Absolute: 0.9 10*3/uL (ref 0.1–1.0)
Monocytes Relative: 9 %
Neutro Abs: 7.7 10*3/uL (ref 1.7–7.7)
Neutrophils Relative %: 76 %
Platelets: 187 10*3/uL (ref 150–400)
RBC: 4.11 MIL/uL — ABNORMAL LOW (ref 4.22–5.81)
RDW: 12.9 % (ref 11.5–15.5)
WBC: 10 10*3/uL (ref 4.0–10.5)
nRBC: 0 % (ref 0.0–0.2)

## 2018-05-12 LAB — URINALYSIS, ROUTINE W REFLEX MICROSCOPIC
Bilirubin Urine: NEGATIVE
Glucose, UA: NEGATIVE mg/dL
Hgb urine dipstick: NEGATIVE
Ketones, ur: 5 mg/dL — AB
Leukocytes, UA: NEGATIVE
Nitrite: NEGATIVE
Protein, ur: NEGATIVE mg/dL
Specific Gravity, Urine: 1.013 (ref 1.005–1.030)
pH: 6 (ref 5.0–8.0)

## 2018-05-12 LAB — COMPREHENSIVE METABOLIC PANEL
ALT: 11 U/L (ref 0–44)
AST: 25 U/L (ref 15–41)
Albumin: 3.3 g/dL — ABNORMAL LOW (ref 3.5–5.0)
Alkaline Phosphatase: 59 U/L (ref 38–126)
Anion gap: 6 (ref 5–15)
BUN: 21 mg/dL (ref 8–23)
CO2: 26 mmol/L (ref 22–32)
Calcium: 8.1 mg/dL — ABNORMAL LOW (ref 8.9–10.3)
Chloride: 101 mmol/L (ref 98–111)
Creatinine, Ser: 1.25 mg/dL — ABNORMAL HIGH (ref 0.61–1.24)
GFR calc Af Amer: >60 mL/min (ref >60)
GFR calc non Af Amer: 55 mL/min — ABNORMAL LOW (ref >60)
Glucose, Bld: 96 mg/dL (ref 70–99)
Potassium: 5.5 mmol/L — ABNORMAL HIGH (ref 3.5–5.1)
Sodium: 133 mmol/L — ABNORMAL LOW (ref 135–145)
Total Bilirubin: 1.3 mg/dL — ABNORMAL HIGH (ref 0.3–1.2)
Total Protein: 5.8 g/dL — ABNORMAL LOW (ref 6.5–8.1)

## 2018-05-12 LAB — C-REACTIVE PROTEIN: CRP: 1.7 mg/dL — ABNORMAL HIGH (ref <1.0)

## 2018-05-12 LAB — I-STAT TROPONIN, ED: Troponin i, poc: 0.02 ng/mL (ref 0.00–0.08)

## 2018-05-12 LAB — INFLUENZA PANEL BY PCR (TYPE A & B)
Influenza A By PCR: NEGATIVE
Influenza B By PCR: NEGATIVE

## 2018-05-12 MED ORDER — ONDANSETRON HCL 4 MG/2ML IJ SOLN
4.0000 mg | Freq: Once | INTRAMUSCULAR | Status: AC
  Administered 2018-05-12: 4 mg via INTRAVENOUS
  Filled 2018-05-12: qty 2

## 2018-05-12 MED ORDER — FENTANYL CITRATE (PF) 100 MCG/2ML IJ SOLN
50.0000 ug | Freq: Once | INTRAMUSCULAR | Status: AC
  Administered 2018-05-12: 50 ug via INTRAVENOUS
  Filled 2018-05-12: qty 2

## 2018-05-12 MED ORDER — IOPAMIDOL (ISOVUE-300) INJECTION 61%
INTRAVENOUS | Status: AC
  Filled 2018-05-12: qty 100

## 2018-05-12 MED ORDER — SODIUM CHLORIDE 0.9 % IV SOLN
1.0000 g | Freq: Once | INTRAVENOUS | Status: AC
  Administered 2018-05-12: 1 g via INTRAVENOUS
  Filled 2018-05-12: qty 10

## 2018-05-12 MED ORDER — SODIUM CHLORIDE 0.9 % IV SOLN
500.0000 mg | Freq: Once | INTRAVENOUS | Status: AC
  Administered 2018-05-13: 500 mg via INTRAVENOUS
  Filled 2018-05-12: qty 500

## 2018-05-12 MED ORDER — IOPAMIDOL (ISOVUE-300) INJECTION 61%
100.0000 mL | Freq: Once | INTRAVENOUS | Status: AC | PRN
  Administered 2018-05-12: 100 mL via INTRAVENOUS

## 2018-05-12 MED ORDER — SODIUM CHLORIDE (PF) 0.9 % IJ SOLN
INTRAMUSCULAR | Status: AC
  Filled 2018-05-12: qty 50

## 2018-05-12 NOTE — Telephone Encounter (Signed)
Refill Request.  

## 2018-05-12 NOTE — ED Notes (Signed)
PT had episode of vomiting and was then cleaned and assisted with urinal.

## 2018-05-12 NOTE — ED Notes (Signed)
Patient ambulated with walker 132ft. Tolerated well.  Patient was also offered a beverage.

## 2018-05-12 NOTE — ED Notes (Signed)
Bed: WA12 Expected date:  Expected time:  Means of arrival:  Comments: Hall [facility] 

## 2018-05-12 NOTE — ED Provider Notes (Addendum)
[facility] DEPT Provider Note   CSN: [id number] Arrival date & time: 05/12/18  1442     History   Chief Complaint Chief Complaint  Patient presents with  . Weakness    HPI Benjamin George is a 78 y.o. male with a hx of afib, CAD, HTN, hyperlipidemia, sleep apnea, and prior appendectomy who arrives to the ED via EMS with his wife for weakness today.    Patient states he has a spinal stimulator in place secondary to chronic back pain. Over the past 2 weeks he has had increased pain to the bilateral lower back. Pain is constant, worse with movement, no alleviating factors. He notes that he has had increased urinary frequency with dysuria  & malodorous urine.  He feels he may have had some incontinence, but seems to be having some difficulty understanding true meaning of this question as well as multiple other questions throughout evaluation and does have dementia, his wife does not believe he has lost control of his bladder, but has been urinating more often . He walks with a cane or walker at baseline secondary to his chronic back pain, but his wife states that today when she went to get him out of the car he became generally weak and slid somewhat, no fall occurred, it is difficult to discern if this was a near syncopal event, patient cannot tell me if he felt lightheaded/dizzy. She lowered him to the ground and he did not have a head injury. This prompted her to call EMS. She states he is at his mental status baseline. Denies fever, chest pain, dyspnea, abdominal pain, vomiting, numbness, or weakness.   Level 5 Caveat secondary to dementia.   Per EMS to triage team, patient noted to have a temp of 100.8- given 1000 mg of Tylenol en route.   HPI  Past Medical History:  Diagnosis Date  . Arthritis   . Atrial fibrillation ([place])   . Cardiac arrest ([place])   . Complication of anesthesia   . Gout   . Gout   . Heart attack ([place])   . Heart disease   . HLD  (hyperlipidemia)   . HTN (hypertension)   . Memory disorder 12/21/2015  . PONV (postoperative nausea and vomiting)   . Sleep apnea    NPSG 10/26/07 - AHI 16.4 used5 yrs, uses CPAP nightly    Patient Active Problem List   Diagnosis Date Noted  . Ischemic cardiomyopathy 04/18/2017  . Memory disorder 12/21/2015  . Spinal stenosis 05/06/2013  . Palpitations 03/02/2013  . Morbid obesity ([place]) 12/12/2011  . Arthritis of shoulder region, 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  . CAD (coronary artery disease) 10/24/2010  . Preoperative evaluation to rule out surgical contraindication 10/24/2010  . ALLERGIC RHINITIS 05/17/2010  . INSOMNIA 05/17/2010  . Hyperlipidemia LDL goal <70 04/13/2009  . GOUT 04/13/2009  . Obstructive sleep apnea 04/13/2009  . Essential hypertension 04/13/2009  . HEART ATTACK 04/13/2009  . CARDIAC ARREST 04/13/2009    Past Surgical History:  Procedure Laterality Date  . angioplasty    . APPENDECTOMY    . CAROTID STENT    . CARPECTOMY Left 07/22/2017   Procedure: LEFT WRIST HARDWARE REMOVAL AND PROXIMAL ROW CARPECTOMY;  Surgeon: Milly Jakob, MD;  Location: [facility];  Service: Orthopedics;  Laterality: Left;  . CORONARY ANGIOPLASTY WITH STENT PLACEMENT  06/27/2007   L main OK, LAD mild irreg, CFX stent OK, irreg, RCA 60% calcified, EF 45%   . HAND SURGERY  left   . HARDWARE REMOVAL Left 07/22/2017   Procedure: HARDWARE REMOVAL;  Surgeon: Milly Jakob, MD;  Location: [facility];  Service: Orthopedics;  Laterality: Left;  . LUMBAR LAMINECTOMY/DECOMPRESSION MICRODISCECTOMY N/A 05/06/2013   Procedure: Lumbar 4-5 decompression    1 LEVEL;  Surgeon: Sinclair Ship, MD;  Location: [facility];  Service: Orthopedics;  Laterality: N/A;  Lumbar 4-5 decompression  . nasal septopalsty    . ORIF SCAPHOID FRACTURE Left 01/21/2017   Procedure: OPEN TREATMENT OF LEFT SCAPHOID FRACTURE;  Surgeon: Milly Jakob, MD;  Location: [facility];   Service: Orthopedics;  Laterality: Left;  . TOTAL SHOULDER ARTHROPLASTY  04/17/2011   Procedure: TOTAL SHOULDER ARTHROPLASTY; left Surgeon: Nita Sells, MD;  Location: [facility];  Service: Orthopedics;  Laterality: Left;        Home Medications    Prior to Admission medications   Medication Sig Start Date End Date Taking? Authorizing Provider  acetaminophen (TYLENOL) 325 MG tablet Take 2 tablets (650 mg total) by mouth every 6 (six) hours as needed for mild pain or moderate pain. 01/21/17   Milly Jakob, MD  allopurinol (ZYLOPRIM) 100 MG tablet Take 100 mg by mouth every morning.     [provider]  aspirin 81 MG tablet Take 81 mg by mouth daily.    [provider]  atenolol (TENORMIN) 50 MG tablet Take 1 tablet (50 mg total) by mouth daily. 05/20/17   End, Harrell Gave, MD  atorvastatin (LIPITOR) 20 MG tablet TAKE 1 TABLET BY MOUTH EVERY DAY 02/20/17   Larey Dresser, MD  CINNAMON PO Take 1,000 mg by mouth daily.    [provider]  Coenzyme Q10 200 MG capsule Take 200 mg by mouth daily.    [provider]  isosorbide mononitrate (IMDUR) 30 MG 24 hr tablet TAKE 1 TABLET BY MOUTH EVERY DAY 05/12/18   End, Harrell Gave, MD  LINZESS 290 MCG CAPS capsule 290 mcg. 11/29/16   [provider]  losartan-hydrochlorothiazide (HYZAAR) 100-12.5 MG tablet TAKE 1 TABLET BY MOUTH EVERY DAY Patient taking differently: 1 tablet.  06/18/16   Larey Dresser, MD  meclizine (ANTIVERT) 25 MG tablet Take 25 mg by mouth 2 (two) times daily as needed (for vertigo).    [provider]  memantine (NAMENDA) 10 MG tablet TAKE 1 TABLET BY MOUTH TWICE A DAY 11/18/17   Ward Givens, NP  Multiple Vitamin (MULTIVITAMIN) tablet Take 1 tablet by mouth every morning.     [provider]  ondansetron (ZOFRAN) 4 MG tablet Take 1 tablet (4 mg total) by mouth every 8 (eight) hours as needed for nausea or vomiting. Patient not taking: Reported on 05/06/2018  01/21/17   Milly Jakob, MD  traMADol Veatrice Bourbon) 50 MG tablet Take by mouth every 6 (six) hours as needed.    [provider]  vitamin B-12 (CYANOCOBALAMIN) 100 MCG tablet Take 200 mcg by mouth every morning.     [provider]  vitamin C (ASCORBIC ACID) 500 MG tablet Take 500 mg by mouth every morning.    [provider]    Family History Family History  Problem Relation Age of Onset  . Heart attack Father   . Heart disease Father   . Hyperlipidemia Father   . Other Father        [name]  . Alzheimer's disease Mother   . Colon cancer Neg Hx   . Esophageal cancer Neg Hx   . Rectal cancer  Neg Hx   . Stomach cancer Neg Hx     Social History Social History   Tobacco Use  . Smoking status: Former Smoker    Packs/day: 1.50    Years: 30.00    Pack years: 45.00    Types: Cigarettes    Last attempt to quit: 04/28/1987    Years since quitting: 31.0  . Smokeless tobacco: Never Used  . Tobacco comment: smoked 2 ppd for 32 years; quit in 1989  Substance Use Topics  . Alcohol use: Yes    Alcohol/week: 16.0 standard drinks    Types: 14 Cans of beer, 2 Shots of liquor per week    Comment: [place] of beers per day  . Drug use: No     Allergies   Codeine; Hydrocodone; Aricept [donepezil hcl]; Oxycontin [oxycodone hcl]; Methocarbamol; and Other   Review of Systems Review of Systems  Unable to perform ROS: Dementia     Physical Exam Updated Vital Signs Ht 6' (1.829 m)   Wt 110.3 kg   SpO2 95%   BMI 32.98 kg/m   Physical Exam Vitals signs and nursing note reviewed. Exam conducted with a chaperone present.  Constitutional:      General: He is not in acute distress.    Appearance: He is well-developed. He is not toxic-appearing.  HENT:     Head: Normocephalic and atraumatic.     Comments: No racoon eyes or battle sign.     Ears:     Comments: No hemotympanum.     Nose: No rhinorrhea.     Mouth/Throat:     Mouth: Mucous membranes are  moist.  Eyes:     General:        Right eye: No discharge.        Left eye: No discharge.     Extraocular Movements: Extraocular movements intact.     Conjunctiva/sclera: Conjunctivae normal.     Pupils: Pupils are equal, round, and reactive to light.  Neck:     Musculoskeletal: Normal range of motion and neck supple.     Comments: No midline cervical tenderness or palpable step off Cardiovascular:     Rate and Rhythm: Normal rate and regular rhythm.     Pulses: Normal pulses.     Comments: 2+ symmetric radial and DP pulses.  Pulmonary:     Effort: Pulmonary effort is normal. No respiratory distress.     Breath sounds: Normal breath sounds. No wheezing, rhonchi or rales.  Abdominal:     General: There is no distension.     Palpations: Abdomen is soft.     Tenderness: There is no abdominal tenderness. There is no guarding or rebound.  Genitourinary:    Comments: Rectal tone intact Musculoskeletal:     Comments: No obvious deformity, appreciable swelling, erythema, ecchymosis, or warmth.  Back: Diffuse tenderness to the lumbar region including midline and bilateral paraspinal muscles.  Moving all extremities.   Skin:    General: Skin is warm and dry.     Findings: No rash.  Neurological:     General: No focal deficit present.     Mental Status: He is alert.     Comments: Clear speech. Sensation grossly intact to bilateral lower extremities. 5/5 strength with plantar/dorsiflexion bilaterally. Able to lift bilateral legs off of the bed. Negative pronator drift. Normal finger to nose. Ambulation deferred on initial assessment.   Psychiatric:        Behavior: Behavior normal.    ED Treatments /  Results  Labs (all labs ordered are listed, but only abnormal results are displayed) Labs Reviewed  CBC WITH DIFFERENTIAL/PLATELET - Abnormal; Notable for the following components:      Result Value   RBC 4.11 (*)    Hemoglobin 12.9 (*)    HCT 38.8 (*)    All other components within  normal limits  COMPREHENSIVE METABOLIC PANEL - Abnormal; Notable for the following components:   Sodium 133 (*)    Potassium 5.5 (*)    Creatinine, Ser 1.25 (*)    Calcium 8.1 (*)    Total Protein 5.8 (*)    Albumin 3.3 (*)    Total Bilirubin 1.3 (*)    GFR calc non Af Amer 55 (*)    All other components within normal limits  URINALYSIS, ROUTINE W REFLEX MICROSCOPIC - Abnormal; Notable for the following components:   Ketones, ur 5 (*)    All other components within normal limits  C-REACTIVE PROTEIN - Abnormal; Notable for the following components:   CRP 1.7 (*)    All other components within normal limits  URINE CULTURE  INFLUENZA PANEL BY PCR (TYPE A & B)  SEDIMENTATION RATE    EKG EKG Interpretation  Date/Time:  Monday May 12 2018 18:58:52 EST Ventricular Rate:  67 PR Interval:    QRS Duration: 111 QT Interval:  417 QTC Calculation: 441 R Axis:   68 Text Interpretation:  Sinus or ectopic atrial rhythm Incomplete left bundle branch block Confirmed by Davonna Belling [phone]) on 05/12/2018 10:26:59 PM   Radiology Dg Chest 2 View  Result Date: 05/12/2018 CLINICAL DATA:  78 y/o  M; weakness, UTI, fever. EXAM: CHEST - 2 VIEW COMPARISON:  06/08/2015 chest radiograph FINDINGS: Mildly enlarged cardiac silhouette given projection and technique. Aortic calcific atherosclerosis. Ill-defined left basilar opacity. No pleural effusion or pneumothorax. No acute osseous abnormality is evident. Thoracic spine stimulators noted. IMPRESSION: Ill-defined left basilar opacity may represent atelectasis or pneumonia. Mildly enlarged cardiac silhouette. Electronically Signed   By: Kristine Garbe M.D.   On: 05/12/2018 22:03   Ct Lumbar Spine W Contrast  Result Date: 05/12/2018 CLINICAL DATA:  Back pain.  Fever. EXAM: CT LUMBAR SPINE WITH CONTRAST TECHNIQUE: Multidetector CT imaging of the lumbar spine was performed with intravenous contrast administration. CONTRAST:  1103m ISOVUE-300  IOPAMIDOL (ISOVUE-300) INJECTION 61% COMPARISON:  Lumbar spine radiographs 11/21/2016 FINDINGS: Segmentation: 5 non rib-bearing lumbar type vertebral bodies are present. The lowest fully formed vertebral body is L5. Alignment: Degenerative retrolisthesis is present at L2-3 and L3-4. There is slight retrolisthesis at L5-S1. Mild rightward curvature is present in the lower thoracic spine. Leftward curvature in the lower lumbar spine results in asymmetric right-sided endplate degenerative changes. Vertebrae: Asymmetric right-sided degenerative changes are noted in the lower lumbar spine associated with left-sided curvature. Vertebral body heights are maintained. Subchondral cysts are present without other focal lytic or blastic lesions. Paraspinal and other soft tissues: Atherosclerotic calcifications are present in the aorta and branch vessels. A 10 mm cyst is noted posteriorly in the left kidney. No other solid organ lesions are present. There is no significant adenopathy. Disc levels: Spinal cord stimulator enters the canal at T11-12. Thoracic spine canal and foramina are normal. L1-2: A vacuum disc is present. A broad-based disc protrusion is asymmetric to the left. Mild left subarticular and foraminal narrowing is present. L2-3: A vacuum disc is present. A broad-based disc protrusion is present. Mild subarticular narrowing is worse on the left. Moderate foraminal stenosis is worse on  the right. L3-4: A broad-based disc protrusion is present. Mild facet hypertrophy and ligamentum flavum thickening is noted. Moderate subarticular narrowing is present bilaterally. There is severe bilateral foraminal stenosis. L4-5: A broad-based disc protrusion is present. Laminectomy is noted. Severe right and moderate left foraminal stenosis is present. Moderate subarticular narrowing is present bilaterally. L5-S1: A broad-based disc protrusion is present. There is fusion across the disc space. Severe right moderate left foraminal  stenosis is present. Subarticular narrowing is worse on the right. IMPRESSION: 1. No definite fluid collections or evidence for infection. No osseous erosion. 2. Extensive degenerative change with leftward curvature of the lumbar spine and asymmetric right-sided stenosis. Electronically Signed   By: San Morelle M.D.   On: 05/12/2018 19:01    Procedures Procedures (including critical care time)  Medications Ordered in ED Medications  iopamidol (ISOVUE-300) 61 % injection (has no administration in time range)  sodium chloride (PF) 0.9 % injection (has no administration in time range)  cefTRIAXone (ROCEPHIN) 1 g in sodium chloride 0.9 % 100 mL IVPB (has no administration in time range)  azithromycin (ZITHROMAX) 500 mg in sodium chloride 0.9 % 250 mL IVPB (has no administration in time range)  fentaNYL (SUBLIMAZE) injection 50 mcg (50 mcg Intravenous Given 05/12/18 1557)  ondansetron (ZOFRAN) injection 4 mg (4 mg Intravenous Given 05/12/18 1557)  iopamidol (ISOVUE-300) 61 % injection 100 mL (100 mLs Intravenous Contrast Given 05/12/18 1823)  ondansetron (ZOFRAN) injection 4 mg (4 mg Intravenous Given 05/12/18 2019)     Initial Impression / Assessment and Plan / ED Course  I have reviewed the triage vital signs and the nursing notes.  Pertinent labs & imaging results that were available during my care of the patient were reviewed by me and considered in my medical decision making (see chart for details).   Patient presents to the ED via EMS with back pain, an episode of weakness, and urinary sxs. EMS noted fever w/ temp of 100.8, did receive tylenol, afebrile here at 98.3 orally. He is not toxic appearing, initial vitals w/ mildly elevated BP. On exam he has some lower back tenderness including midline L spine and bilateral lumbar paraspinal muscle tenderness. He does not have any focal neuro deficits, his weakness episode does not seem consistent with a stroke based on H&P, possibly a near  syncopal event- unclear, no chest pain or dyspnea.  Good strength & sensation throughout lower extremities, patient ultimately ambulatory in the ER with a walker, rectal tone is intact, history does not seem consistent with true incontinence, doubt cauda equina- additionally unable to assess w/ MRI secondary to spinal stimulator in place. Will evaluate with basic labs, tx pain, and re-assess per discussion with Dr. Alvino Chapel.   Work-up reviewed:  CBC: Anemia consistent with prior ranges. No leukocytosis.  CMP: Mild electrolyte disturbances w/ sodium of 133, potassium elevated at 5.5- moderate hemolysis likely cause: EKG without peaked T waves.  UA: No UTI.  EKG: Sinus, no STEMI, old EKGs reviewed without significant change noted today.  W/ negative urine, increased back pain recently, and questionable fever PTA with weakness discussed with Dr. Alvino Chapel- add on ESR/CRP,  CT L spine w contrast, and flu testing.   Flu testing: negative ESR: negative CRP: very minimally elevated @ 1.7.  CT L spine: No definite fluid collections or evidence for infection. No osseous erosion. Extensive degenerative change with leftward curvature of the lumbar spine and asymmetric right-sided stenosis.   Patient began vomiting following CT scan, zofran administered, and  subsequently tolerating PO. Patient ambulatory with walker which he uses at home. He has been coughing throughout visit with borderline SpO2, he still does not feel well in general, lungs without obvious adventitious sounds, CXR added to work-up which reveals concern for pneumonia, tx for CAP w/ abx, w/ pneumonia, generalized weakness, and borderline oxygen throughout stay will plan for admission. Findings and plan of care discussed with supervising physician Dr. Alvino Chapel who personally evaluated and examined this patient & provided guidance in work-up, management, and plan for admission.   22:59: CONSULT: Discussed case with hospitalist Dr. Hal Hope  who accepts admission.    Final Clinical Impressions(s) / ED Diagnoses   Final diagnoses:  Community acquired pneumonia of left lower lobe of lung [facility])    ED Discharge Orders    None       Amaryllis Dyke, PA-C 05/12/18 [address], PA-C 05/12/18 2317    Davonna Belling, MD 05/12/18 2321

## 2018-05-12 NOTE — ED Triage Notes (Signed)
EMS states pt has frequent UTI'S pt feels as if this is the problem, symptoms started yesterday. Fever 100.8 with EMS, 1000mg  Tylenol given, foul smelling, frequent urination with burning.

## 2018-05-13 ENCOUNTER — Encounter (HOSPITAL_COMMUNITY): Payer: Self-pay | Admitting: Internal Medicine

## 2018-05-13 ENCOUNTER — Other Ambulatory Visit: Payer: Self-pay

## 2018-05-13 DIAGNOSIS — J181 Lobar pneumonia, unspecified organism: Secondary | ICD-10-CM

## 2018-05-13 DIAGNOSIS — I10 Essential (primary) hypertension: Secondary | ICD-10-CM

## 2018-05-13 DIAGNOSIS — J189 Pneumonia, unspecified organism: Secondary | ICD-10-CM | POA: Diagnosis present

## 2018-05-13 LAB — CBC
HCT: 39 % (ref 39.0–52.0)
Hemoglobin: 12.9 g/dL — ABNORMAL LOW (ref 13.0–17.0)
MCH: 30.9 pg (ref 26.0–34.0)
MCHC: 33.1 g/dL (ref 30.0–36.0)
MCV: 93.5 fL (ref 80.0–100.0)
Platelets: 159 10*3/uL (ref 150–400)
RBC: 4.17 MIL/uL — ABNORMAL LOW (ref 4.22–5.81)
RDW: 12.7 % (ref 11.5–15.5)
WBC: 8.4 10*3/uL (ref 4.0–10.5)
nRBC: 0 % (ref 0.0–0.2)

## 2018-05-13 LAB — BASIC METABOLIC PANEL
Anion gap: 9 (ref 5–15)
BUN: 17 mg/dL (ref 8–23)
CO2: 23 mmol/L (ref 22–32)
Calcium: 8.2 mg/dL — ABNORMAL LOW (ref 8.9–10.3)
Chloride: 103 mmol/L (ref 98–111)
Creatinine, Ser: 1 mg/dL (ref 0.61–1.24)
GFR calc Af Amer: >60 mL/min (ref >60)
GFR calc non Af Amer: >60 mL/min (ref >60)
Glucose, Bld: 141 mg/dL — ABNORMAL HIGH (ref 70–99)
Potassium: 4 mmol/L (ref 3.5–5.1)
Sodium: 135 mmol/L (ref 135–145)

## 2018-05-13 LAB — TROPONIN I
Troponin I: <0.03 ng/mL (ref <0.03)
Troponin I: <0.03 ng/mL (ref <0.03)
Troponin I: <0.03 ng/mL (ref <0.03)

## 2018-05-13 LAB — TSH: TSH: 0.853 u[IU]/mL (ref 0.350–4.500)

## 2018-05-13 LAB — STREP PNEUMONIAE URINARY ANTIGEN: Strep Pneumo Urinary Antigen: NEGATIVE

## 2018-05-13 MED ORDER — TRAMADOL HCL 50 MG PO TABS
50.0000 mg | ORAL_TABLET | Freq: Four times a day (QID) | ORAL | Status: DC | PRN
  Administered 2018-05-13: 50 mg via ORAL
  Filled 2018-05-13: qty 1

## 2018-05-13 MED ORDER — ATENOLOL 50 MG PO TABS
50.0000 mg | ORAL_TABLET | Freq: Every day | ORAL | Status: DC
  Administered 2018-05-13 – 2018-05-17 (×5): 50 mg via ORAL
  Filled 2018-05-13 (×5): qty 1

## 2018-05-13 MED ORDER — ATORVASTATIN CALCIUM 20 MG PO TABS
20.0000 mg | ORAL_TABLET | Freq: Every day | ORAL | Status: DC
  Administered 2018-05-13 – 2018-05-16 (×4): 20 mg via ORAL
  Filled 2018-05-13 (×4): qty 1

## 2018-05-13 MED ORDER — ACETAMINOPHEN 650 MG RE SUPP: 650.0000 mg | Freq: Four times a day (QID) | RECTAL | Status: DC | PRN

## 2018-05-13 MED ORDER — ALLOPURINOL 100 MG PO TABS
100.0000 mg | ORAL_TABLET | Freq: Every morning | ORAL | Status: DC
  Administered 2018-05-13 – 2018-05-17 (×5): 100 mg via ORAL
  Filled 2018-05-13 (×5): qty 1

## 2018-05-13 MED ORDER — ACETAMINOPHEN 325 MG PO TABS: 650.0000 mg | ORAL_TABLET | Freq: Four times a day (QID) | ORAL | Status: DC | PRN

## 2018-05-13 MED ORDER — ASPIRIN EC 81 MG PO TBEC
81.0000 mg | DELAYED_RELEASE_TABLET | Freq: Every day | ORAL | Status: DC
  Administered 2018-05-13 – 2018-05-17 (×5): 81 mg via ORAL
  Filled 2018-05-13 (×5): qty 1

## 2018-05-13 MED ORDER — VITAMIN B-12 100 MCG PO TABS
200.0000 ug | ORAL_TABLET | Freq: Every morning | ORAL | Status: DC
  Administered 2018-05-13 – 2018-05-17 (×5): 200 ug via ORAL
  Filled 2018-05-13 (×5): qty 2

## 2018-05-13 MED ORDER — LINACLOTIDE 145 MCG PO CAPS
290.0000 ug | ORAL_CAPSULE | Freq: Every day | ORAL | Status: DC
  Administered 2018-05-13 – 2018-05-17 (×5): 290 ug via ORAL
  Filled 2018-05-13 (×5): qty 2

## 2018-05-13 MED ORDER — ONDANSETRON HCL 4 MG/2ML IJ SOLN: 4.0000 mg | Freq: Four times a day (QID) | INTRAMUSCULAR | Status: DC | PRN

## 2018-05-13 MED ORDER — SODIUM CHLORIDE 0.9 % IV SOLN
1.0000 g | INTRAVENOUS | Status: DC
  Administered 2018-05-13 – 2018-05-15 (×3): 1 g via INTRAVENOUS
  Filled 2018-05-13 (×2): qty 1
  Filled 2018-05-13: qty 10
  Filled 2018-05-13: qty 1

## 2018-05-13 MED ORDER — ENOXAPARIN SODIUM 40 MG/0.4ML ~~LOC~~ SOLN
40.0000 mg | Freq: Every day | SUBCUTANEOUS | Status: DC
  Administered 2018-05-13 – 2018-05-17 (×5): 40 mg via SUBCUTANEOUS
  Filled 2018-05-13 (×5): qty 0.4

## 2018-05-13 MED ORDER — SODIUM CHLORIDE 0.9 % IV SOLN
500.0000 mg | INTRAVENOUS | Status: DC
  Administered 2018-05-13: 500 mg via INTRAVENOUS
  Filled 2018-05-13: qty 500

## 2018-05-13 MED ORDER — MEMANTINE HCL 10 MG PO TABS
10.0000 mg | ORAL_TABLET | Freq: Two times a day (BID) | ORAL | Status: DC
  Administered 2018-05-13 – 2018-05-17 (×10): 10 mg via ORAL
  Filled 2018-05-13 (×10): qty 1

## 2018-05-13 MED ORDER — ISOSORBIDE MONONITRATE ER 30 MG PO TB24
30.0000 mg | ORAL_TABLET | Freq: Every day | ORAL | Status: DC
  Administered 2018-05-13 – 2018-05-14 (×2): 30 mg via ORAL
  Filled 2018-05-13 (×2): qty 1

## 2018-05-13 MED ORDER — LOSARTAN POTASSIUM-HCTZ 100-12.5 MG PO TABS: 1.0000 | ORAL_TABLET | Freq: Every day | ORAL | Status: DC

## 2018-05-13 MED ORDER — HYDROCHLOROTHIAZIDE 12.5 MG PO CAPS
12.5000 mg | ORAL_CAPSULE | Freq: Every day | ORAL | Status: DC
  Administered 2018-05-13: 12.5 mg via ORAL
  Filled 2018-05-13: qty 1

## 2018-05-13 MED ORDER — MECLIZINE HCL 25 MG PO TABS: 25.0000 mg | ORAL_TABLET | Freq: Two times a day (BID) | ORAL | Status: DC | PRN

## 2018-05-13 MED ORDER — LOSARTAN POTASSIUM 50 MG PO TABS
100.0000 mg | ORAL_TABLET | Freq: Every day | ORAL | Status: DC
  Administered 2018-05-13 – 2018-05-14 (×2): 100 mg via ORAL
  Filled 2018-05-13 (×2): qty 2

## 2018-05-13 MED ORDER — ONDANSETRON HCL 4 MG PO TABS: 4.0000 mg | ORAL_TABLET | Freq: Four times a day (QID) | ORAL | Status: DC | PRN

## 2018-05-13 NOTE — Evaluation (Signed)
Physical Therapy Evaluation Patient Details Name: Benjamin George MRN: [id number] DOB: 1940/06/16 Today's Date: 05/13/2018   History of Present Illness  Benjamin George is a 78 y.o. male with history of dementia, CAD status post remote stenting, cardiomyopathy , back surgeries, spine stimulator,was brought to the ER the patient was feeling weak, difficulty walking and confused  Clinical Impression  Patient's wife present. Patient presents with decreased  Balance and AMS, does not appear near baseline as patient was driving and going to gym for workouts. Patient currently requires assistance to ambulate. Hopefully will improve with medical management.  Will follow up in AM . Pt admitted with above diagnosis. Pt currently with functional limitations due to the deficits listed below (see PT Problem List).  Pt will benefit from skilled PT to increase their independence and safety with mobility to allow discharge to the venue listed below.       Follow Up Recommendations Home health PT    Equipment Recommendations  Rolling walker with 5" wheels    Recommendations for Other Services       Precautions / Restrictions Precautions Precautions: Fall      Mobility  Bed Mobility Overal bed mobility: Needs Assistance Bed Mobility: Supine to Sit     Supine to sit: Min assist     General bed mobility comments: assist with trunk  Transfers Overall transfer level: Needs assistance Equipment used: Rolling walker (2 wheeled) Transfers: Sit to/from Stand Sit to Stand: Min assist         General transfer comment: cues for safety, steady assist, easily distracted  Ambulation/Gait Ambulation/Gait assistance: Min assist;Mod assist Gait Distance (Feet): 25 Feet(x2) Assistive device: Rolling walker (2 wheeled) Gait Pattern/deviations: Step-to pattern;Step-through pattern;Staggering left;Decreased stride length Gait velocity: decr   General Gait Details: patient had 1 balance loss. patient  requested to sit down after 25' due back pain.   Stairs            Wheelchair Mobility    Modified Rankin (Stroke Patients Only)       Balance Overall balance assessment: History of Falls;Needs assistance Sitting-balance support: No upper extremity supported;Feet supported Sitting balance-Leahy Scale: Good     Standing balance support: During functional activity;Bilateral upper extremity supported Standing balance-Leahy Scale: Poor Standing balance comment: needs external support                             Pertinent Vitals/Pain Pain Assessment: Faces Faces Pain Scale: Hurts even more Pain Location: back Pain Descriptors / Indicators: Aching Pain Intervention(s): Monitored during session    Home Living Family/patient expects to be discharged to:: Private residence Living Arrangements: Spouse/significant other Available Help at Discharge: Family Type of Home: House Home Access: Stairs to enter Entrance Stairs-Rails: None Entrance Stairs-Number of Steps: 3 Home Layout: Full bath on main level Home Equipment: Cane - single point;Walker - 2 wheels      Prior Function Level of Independence: Independent         Comments: drives, goes to gym     Hand Dominance        Extremity/Trunk Assessment   Upper Extremity Assessment Upper Extremity Assessment: Generalized weakness    Lower Extremity Assessment Lower Extremity Assessment: Generalized weakness    Cervical / Trunk Assessment Cervical / Trunk Assessment: Normal  Communication   Communication: No difficulties  Cognition Arousal/Alertness: Awake/alert Behavior During Therapy: Impulsive Overall Cognitive Status: Impaired/Different from baseline Area of Impairment: Orientation;Memory;Safety/judgement;Awareness  Orientation Level: Situation   Memory: Decreased short-term memory   Safety/Judgement: Decreased awareness of safety;Decreased awareness of  deficits Awareness: Emergent   General Comments: wife had to repeat to patient to not charge stimulator until finished with PT      General Comments      Exercises     Assessment/Plan    PT Assessment Patient needs continued PT services  PT Problem List Decreased strength;Decreased range of motion;Decreased cognition;Decreased activity tolerance;Decreased knowledge of use of DME;Decreased balance;Decreased safety awareness;Decreased mobility;Decreased knowledge of precautions       PT Treatment Interventions DME instruction;Therapeutic exercise;Gait training;Stair training;Functional mobility training;Therapeutic activities;Patient/family education;Cognitive remediation    PT Goals (Current goals can be found in the Care Plan section)  Acute Rehab PT Goals Patient Stated Goal: to go to the gym PT Goal Formulation: With patient/family Time For Goal Achievement: 05/27/18 Potential to Achieve Goals: Good    Frequency Min 3X/week   Barriers to discharge        Co-evaluation               AM-PAC PT "6 Clicks" Mobility  Outcome Measure Help needed turning from your back to your side while in a flat bed without using bedrails?: A Lot Help needed moving from lying on your back to sitting on the side of a flat bed without using bedrails?: A Lot Help needed moving to and from a bed to a chair (including a wheelchair)?: A Lot Help needed standing up from a chair using your arms (e.g., wheelchair or bedside chair)?: A Lot Help needed to walk in hospital room?: A Lot Help needed climbing 3-5 steps with a railing? : A Lot 6 Click Score: 12    End of Session Equipment Utilized During Treatment: Gait belt Activity Tolerance: Patient limited by pain Patient left: in chair;with call bell/phone within reach;with family/visitor present;with chair alarm set Nurse Communication: Mobility status PT Visit Diagnosis: Unsteadiness on feet (R26.81);History of falling (Z91.81)     Time: 9150-5697 PT Time Calculation (min) (ACUTE ONLY): 31 min   Charges:   PT Evaluation $PT Eval Low Complexity: 1 Low PT Treatments $Gait Training: 8-22 mins        Tresa Endo PT Acute Rehabilitation Services Pager [phone] Office [phone]   Claretha Cooper 05/13/2018, 3:28 PM

## 2018-05-13 NOTE — Progress Notes (Signed)
MD made aware BP 90/41 Pulse 60. No acute changes noted in Pt's assessment. Maintain current plan of care and monitor Pt closley

## 2018-05-13 NOTE — Progress Notes (Signed)
PROGRESS NOTE    Makani Seckman Kane  ZDG:[id number] DOB: 02-02-1941 DOA: 05/12/2018 PCP: [name], MD   Brief Narrative:  Benjamin George is a 78 y.o. male with history of dementia, CAD status post remote stenting, cardiomyopathy with improvement in EF last 2D echo showed EF of 55 to 60% with grade 1 diastolic dysfunction in 9518 was brought to the ER with generalized weakness, urinary symptoms.    ED Course: In the ER patient was confused, had one episode of nausea vomiting.  CT lumbar spine was negative for anything acute chest x-ray shows possibility of pneumonia. UA is unremarkable.   Assessment & Plan:   Principal Problem:   Community acquired pneumonia of left lower lobe of lung ([place]) Active Problems:   Obstructive sleep apnea   Essential hypertension   CAD (coronary artery disease)   Memory disorder   Ischemic cardiomyopathy   CAP (community acquired pneumonia)  Possible community-acquired pneumonia: - Continue on ceftriaxone and Zithromax. -Patient unable to produce sputum.  Urine for strep pneumo was negative.  Urine Legionella antigen pending at this time.    Generalized weakness: Appears nonfocal at this time.  PT/OT consulted.  CAD status post stenting: Denies any chest pain. Continue on aspirin beta blocker statins Imdur.  Hypertension  - Continue on ARB hydrochlorothiazide beta-blockers. -Creatinine today stable. -Continue to monitor blood pressure, creatinine and adjust medications accordingly.  Advised to follow-up with outpatient physician after discharge.  Dementia  -Today patient is alert and oriented x3.  Continue home medications.  On Namenda.  Sleep apnea on CPAP.  Cardiomyopathy last 2D echo had shown normal EF with EF of 55 to 60% with grade 1 diastolic dysfunction in 8416.  Stable at this time.  Does not appear to be in any volume overload.  Continue current medications.  DVT prophylaxis: Subcutaneous Lovenox Code Status: Full Family  Communication: Discussed with wife at the bedside Disposition Plan: Anticipate discharge tomorrow if remains stable; pending PT evaluation  Consultants:   None   Procedures:  None  Antimicrobials:   Ceftriaxone, azithromycin (05/12/2018)   Subjective: He denies having any major complaints at this time.  He says he is feeling better.  Currently oriented x3.  Objective: Vitals:   05/13/18 0035 05/13/18 0035 05/13/18 0450 05/13/18 1019  BP: (!) 162/63 (!) 162/63 (!) 155/60 (!) 148/62  Pulse: 62 62 62 60  Resp:  19 16   Temp: 97.8 F (36.6 C) 97.8 F (36.6 C) 98.2 F (36.8 C)   TempSrc: Oral Oral Oral   SpO2: 96% 96% 93%   Weight: 107.1 kg     Height: 6' (1.829 m)       Intake/Output Summary (Last 24 hours) at 05/13/2018 1145 Last data filed at 05/13/2018 0200 Gross per 24 hour  Intake 647.46 ml  Output 300 ml  Net 347.46 ml   Filed Weights   05/12/18 1450 05/13/18 0035  Weight: 110.3 kg 107.1 kg    Examination:  General exam: Appears calm and comfortable  Respiratory system: occasional rhonchi otherwise clear to auscultation. Respiratory effort normal. Cardiovascular system: S1 & S2 heard, RRR. No JVD, murmurs, rubs, gallops or clicks. No pedal edema. Gastrointestinal system: Abdomen is nondistended, soft and nontender. No organomegaly or masses felt. Normal bowel sounds heard. Central nervous system: Alert and oriented.  Generalized weakness otherwise no focal neurological deficits. Extremities: Symmetric 5 x 5 power. Skin: No rashes, lesions or ulcers Psychiatry: Judgement and insight appear normal. Mood & affect appropriate.  Data Reviewed: I have personally reviewed following labs and imaging studies  CBC: Recent Labs  Lab 05/12/18 1557 05/13/18 0057  WBC 10.0 8.4  NEUTROABS 7.7  --   HGB 12.9* 12.9*  HCT 38.8* 39.0  MCV 94.4 93.5  PLT 187 478   Basic Metabolic Panel: Recent Labs  Lab 05/12/18 1557 05/13/18 0057  NA 133* 135  K 5.5* 4.0    CL 101 103  CO2 26 23  GLUCOSE 96 141*  BUN 21 17  CREATININE 1.25* 1.00  CALCIUM 8.1* 8.2*   GFR: Estimated Creatinine Clearance: 77 mL/min (by C-G formula based on SCr of 1 mg/dL). Liver Function Tests: Recent Labs  Lab 05/12/18 1557  AST 25  ALT 11  ALKPHOS 59  BILITOT 1.3*  PROT 5.8*  ALBUMIN 3.3*   No results for input(s): LIPASE, AMYLASE in the last 168 hours. No results for input(s): AMMONIA in the last 168 hours. Coagulation Profile: No results for input(s): INR, PROTIME in the last 168 hours. Cardiac Enzymes: Recent Labs  Lab 05/13/18 0057 05/13/18 0640  TROPONINI <0.03 <0.03   BNP (last 3 results) No results for input(s): PROBNP in the last 8760 hours. HbA1C: No results for input(s): HGBA1C in the last 72 hours. CBG: No results for input(s): GLUCAP in the last 168 hours. Lipid Profile: No results for input(s): CHOL, HDL, LDLCALC, TRIG, CHOLHDL, LDLDIRECT in the last 72 hours. Thyroid Function Tests: Recent Labs    05/13/18 0057  TSH 0.853   Anemia Panel: No results for input(s): VITAMINB12, FOLATE, FERRITIN, TIBC, IRON, RETICCTPCT in the last 72 hours. Sepsis Labs: No results for input(s): PROCALCITON, LATICACIDVEN in the last 168 hours.  No results found for this or any previous visit (from the past 240 hour(s)).       Radiology Studies: Dg Chest 2 View  Result Date: 05/12/2018 CLINICAL DATA:  78 y/o  M; weakness, UTI, fever. EXAM: CHEST - 2 VIEW COMPARISON:  06/08/2015 chest radiograph FINDINGS: Mildly enlarged cardiac silhouette given projection and technique. Aortic calcific atherosclerosis. Ill-defined left basilar opacity. No pleural effusion or pneumothorax. No acute osseous abnormality is evident. Thoracic spine stimulators noted. IMPRESSION: Ill-defined left basilar opacity may represent atelectasis or pneumonia. Mildly enlarged cardiac silhouette. Electronically Signed   By: Kristine Garbe M.D.   On: 05/12/2018 22:03   Ct  Lumbar Spine W Contrast  Result Date: 05/12/2018 CLINICAL DATA:  Back pain.  Fever. EXAM: CT LUMBAR SPINE WITH CONTRAST TECHNIQUE: Multidetector CT imaging of the lumbar spine was performed with intravenous contrast administration. CONTRAST:  18mL ISOVUE-300 IOPAMIDOL (ISOVUE-300) INJECTION 61% COMPARISON:  Lumbar spine radiographs 11/21/2016 FINDINGS: Segmentation: 5 non rib-bearing lumbar type vertebral bodies are present. The lowest fully formed vertebral body is L5. Alignment: Degenerative retrolisthesis is present at L2-3 and L3-4. There is slight retrolisthesis at L5-S1. Mild rightward curvature is present in the lower thoracic spine. Leftward curvature in the lower lumbar spine results in asymmetric right-sided endplate degenerative changes. Vertebrae: Asymmetric right-sided degenerative changes are noted in the lower lumbar spine associated with left-sided curvature. Vertebral body heights are maintained. Subchondral cysts are present without other focal lytic or blastic lesions. Paraspinal and other soft tissues: Atherosclerotic calcifications are present in the aorta and branch vessels. A 10 mm cyst is noted posteriorly in the left kidney. No other solid organ lesions are present. There is no significant adenopathy. Disc levels: Spinal cord stimulator enters the canal at T11-12. Thoracic spine canal and foramina are normal. L1-2: A vacuum disc  is present. A broad-based disc protrusion is asymmetric to the left. Mild left subarticular and foraminal narrowing is present. L2-3: A vacuum disc is present. A broad-based disc protrusion is present. Mild subarticular narrowing is worse on the left. Moderate foraminal stenosis is worse on the right. L3-4: A broad-based disc protrusion is present. Mild facet hypertrophy and ligamentum flavum thickening is noted. Moderate subarticular narrowing is present bilaterally. There is severe bilateral foraminal stenosis. L4-5: A broad-based disc protrusion is present.  Laminectomy is noted. Severe right and moderate left foraminal stenosis is present. Moderate subarticular narrowing is present bilaterally. L5-S1: A broad-based disc protrusion is present. There is fusion across the disc space. Severe right moderate left foraminal stenosis is present. Subarticular narrowing is worse on the right. IMPRESSION: 1. No definite fluid collections or evidence for infection. No osseous erosion. 2. Extensive degenerative change with leftward curvature of the lumbar spine and asymmetric right-sided stenosis. Electronically Signed   By: San Morelle M.D.   On: 05/12/2018 19:01        Scheduled Meds: . allopurinol  100 mg Oral q morning - 10a  . aspirin EC  81 mg Oral Daily  . atenolol  50 mg Oral Daily  . atorvastatin  20 mg Oral q1800  . enoxaparin (LOVENOX) injection  40 mg Subcutaneous Daily  . losartan  100 mg Oral Daily   And  . hydrochlorothiazide  12.5 mg Oral Daily  . isosorbide mononitrate  30 mg Oral Daily  . linaclotide  290 mcg Oral QAC breakfast  . memantine  10 mg Oral BID  . vitamin B-12  200 mcg Oral q morning - 10a   Continuous Infusions: . azithromycin    . cefTRIAXone (ROCEPHIN)  IV       LOS: 0 days    Time spent: 26 min    Yaakov Guthrie, MD [facility] Pager on [place]  If 7PM-7AM, please contact night-coverage [email or web address] Password [email or web address] 05/13/2018, 11:45 AM

## 2018-05-13 NOTE — Care Management Obs Status (Signed)
[place] NOTIFICATION   Patient Details  Name: Benjamin George MRN: [id number] Date of Birth: 1941/01/23   Medicare Observation Status Notification Given:  Yes    MahabirJuliann Pulse, RN 05/13/2018, 4:10 PM

## 2018-05-13 NOTE — ED Notes (Signed)
Blood cultures drawn sent to lab (draw and hold).

## 2018-05-13 NOTE — H&P (Signed)
History and Physical    Benjamin George GEZ:[id number] DOB: 09-29-1940 DOA: 05/12/2018  PCP: [name], MD  Patient coming from: Home.  Most of the history is obtained from ER physician as patient has dementia and family not at bedside.  Chief Complaint: Weakness.  HPI: Benjamin George is a 78 y.o. male with history of dementia, CAD status post remote stenting, cardiomyopathy with improvement in EF last 2D echo showed EF of 55 to 60% with grade 1 diastolic dysfunction in 6503 was brought to the ER the patient was feeling weak.  As per the ER physician patient wife states that last evening on trying to help patient out of the car patient was feeling weak and slid onto the floor but did not hit his head.  Felt weak and has been exam low back pain and urinary symptoms last few days.  EMS was called patient had a temperature of 100.8 F for which patient was given Tylenol and brought to the ER.  ED Course: In the ER patient appears confused which is baseline as per the patient's wife who discussed with the ER physician.  Did not complain of any chest pain.  In the ER patient had one episode of nausea vomiting.  CT lumbar spine was negative for anything acute chest x-ray shows possibility of pneumonia.  Given the generalized weakness and possible pneumonia patient was started on antibiotics and admitted for further observation.  EKG shows sinus rhythm with ectopic atrial rhythm.  Troponin was negative.  On my exam patient is moving all extremities.  UA is unremarkable.  Review of Systems: As per HPI, rest all negative.   Past Medical History:  Diagnosis Date  . Arthritis   . Atrial fibrillation ([place])   . Cardiac arrest ([place])   . Complication of anesthesia   . Gout   . Gout   . Heart attack ([place])   . Heart disease   . HLD (hyperlipidemia)   . HTN (hypertension)   . Memory disorder 12/21/2015  . PONV (postoperative nausea and vomiting)   . Sleep apnea    NPSG 10/26/07 - AHI 16.4 used5  yrs, uses CPAP nightly    Past Surgical History:  Procedure Laterality Date  . angioplasty    . APPENDECTOMY    . CAROTID STENT    . CARPECTOMY Left 07/22/2017   Procedure: LEFT WRIST HARDWARE REMOVAL AND PROXIMAL ROW CARPECTOMY;  Surgeon: Milly Jakob, MD;  Location: [facility];  Service: Orthopedics;  Laterality: Left;  . CORONARY ANGIOPLASTY WITH STENT PLACEMENT  06/27/2007   L main OK, LAD mild irreg, CFX stent OK, irreg, RCA 60% calcified, EF 45%   . HAND SURGERY     left   . HARDWARE REMOVAL Left 07/22/2017   Procedure: HARDWARE REMOVAL;  Surgeon: Milly Jakob, MD;  Location: [facility];  Service: Orthopedics;  Laterality: Left;  . LUMBAR LAMINECTOMY/DECOMPRESSION MICRODISCECTOMY N/A 05/06/2013   Procedure: Lumbar 4-5 decompression    1 LEVEL;  Surgeon: Sinclair Ship, MD;  Location: [facility];  Service: Orthopedics;  Laterality: N/A;  Lumbar 4-5 decompression  . nasal septopalsty    . ORIF SCAPHOID FRACTURE Left 01/21/2017   Procedure: OPEN TREATMENT OF LEFT SCAPHOID FRACTURE;  Surgeon: Milly Jakob, MD;  Location: [facility];  Service: Orthopedics;  Laterality: Left;  . TOTAL SHOULDER ARTHROPLASTY  04/17/2011   Procedure: TOTAL SHOULDER ARTHROPLASTY; left Surgeon: Nita Sells, MD;  Location: [facility];  Service: Orthopedics;  Laterality: Left;     reports that he quit smoking about 31 years ago. His smoking use included cigarettes. He has a 45.00 pack-year smoking history. He has never used smokeless tobacco. He reports current alcohol use of about 16.0 standard drinks of alcohol per week. He reports that he does not use drugs.  Allergies  Allergen Reactions  . Codeine Nausea And Vomiting  . Hydrocodone Nausea And Vomiting  . Aricept [Donepezil Hcl]     Nausea  . Oxycontin [Oxycodone Hcl]     Dizzy and uneasy  . Methocarbamol Nausea Only  . Other Nausea Only    vibramyacin    Family History  Problem Relation  Age of Onset  . Heart attack Father   . Heart disease Father   . Hyperlipidemia Father   . Other Father        [name]  . Alzheimer's disease Mother   . Colon cancer Neg Hx   . Esophageal cancer Neg Hx   . Rectal cancer Neg Hx   . Stomach cancer Neg Hx     Prior to Admission medications   Medication Sig Start Date End Date Taking? Authorizing Provider  acetaminophen (TYLENOL) 325 MG tablet Take 2 tablets (650 mg total) by mouth every 6 (six) hours as needed for mild pain or moderate pain. 01/21/17  Yes Milly Jakob, MD  allopurinol (ZYLOPRIM) 100 MG tablet Take 100 mg by mouth every morning.    Yes [provider]  aspirin 81 MG tablet Take 81 mg by mouth daily.   Yes [provider]  atenolol (TENORMIN) 50 MG tablet Take 1 tablet (50 mg total) by mouth daily. 05/20/17  Yes End, Harrell Gave, MD  CINNAMON PO Take 1,000 mg by mouth daily.   Yes [provider]  Coenzyme Q10 200 MG capsule Take 200 mg by mouth daily.   Yes [provider]  isosorbide mononitrate (IMDUR) 30 MG 24 hr tablet TAKE 1 TABLET BY MOUTH EVERY DAY 05/12/18  Yes End, Harrell Gave, MD  LINZESS 290 MCG CAPS capsule Take 290 mcg by mouth daily before breakfast.  11/29/16  Yes [provider]  losartan-hydrochlorothiazide (HYZAAR) 100-12.5 MG tablet TAKE 1 TABLET BY MOUTH EVERY DAY Patient taking differently: Take 1 tablet by mouth daily.  06/18/16  Yes Larey Dresser, MD  meclizine (ANTIVERT) 25 MG tablet Take 25 mg by mouth 2 (two) times daily as needed (for vertigo).   Yes [provider]  memantine (NAMENDA) 10 MG tablet TAKE 1 TABLET BY MOUTH TWICE A DAY 11/18/17  Yes Ward Givens, NP  Multiple Vitamin (MULTIVITAMIN) tablet Take 1 tablet by mouth every morning.    Yes [provider]  traMADol (ULTRAM) 50 MG tablet Take by mouth every 6 (six) hours as needed.   Yes [provider]  vitamin B-12 (CYANOCOBALAMIN) 100 MCG tablet Take 200 mcg  by mouth every morning.    Yes [provider]  vitamin C (ASCORBIC ACID) 500 MG tablet Take 500 mg by mouth every morning.   Yes [provider]  atorvastatin (LIPITOR) 20 MG tablet TAKE 1 TABLET BY MOUTH EVERY DAY 02/20/17   Larey Dresser, MD  ondansetron (ZOFRAN) 4 MG tablet Take 1 tablet (4 mg total) by mouth every 8 (eight) hours as needed for nausea or vomiting. Patient not taking: Reported on 05/06/2018 01/21/17   Milly Jakob, MD    Physical Exam: Vitals:   05/12/18 2230 05/12/18 2339 05/12/18 2348 05/13/18 0000  BP: Marland Kitchen)  155/62 (!) 145/68  (!) 146/72  Pulse: 62  67 68  Resp: 16  18 17   Temp:      TempSrc:      SpO2: 94%  92% 94%  Weight:      Height:          Constitutional: Moderately built and nourished. Vitals:   05/12/18 2230 05/12/18 2339 05/12/18 2348 05/13/18 0000  BP: (!) 155/62 (!) 145/68  (!) 146/72  Pulse: 62  67 68  Resp: 16  18 17   Temp:      TempSrc:      SpO2: 94%  92% 94%  Weight:      Height:       Eyes: Anicteric no pallor. ENMT: No discharge from the ears eyes nose or mouth. Neck: No mass felt.  No neck rigidity. Respiratory: No rhonchi or crepitations. Cardiovascular: S1-S2 heard. Abdomen: Soft nontender bowel sounds present. Musculoskeletal: No edema. Skin: No rash. Neurologic: Alert awake oriented to his name.  Moves all extremities pupils are equal and reacting to light. Psychiatric: Has dementia.   Labs on Admission: I have personally reviewed following labs and imaging studies  CBC: Recent Labs  Lab 05/12/18 1557  WBC 10.0  NEUTROABS 7.7  HGB 12.9*  HCT 38.8*  MCV 94.4  PLT 619   Basic Metabolic Panel: Recent Labs  Lab 05/12/18 1557  NA 133*  K 5.5*  CL 101  CO2 26  GLUCOSE 96  BUN 21  CREATININE 1.25*  CALCIUM 8.1*   GFR: Estimated Creatinine Clearance: 62.5 mL/min (A) (by C-G formula based on SCr of 1.25 mg/dL (H)). Liver Function Tests: Recent Labs  Lab 05/12/18 1557  AST 25  ALT  11  ALKPHOS 59  BILITOT 1.3*  PROT 5.8*  ALBUMIN 3.3*   No results for input(s): LIPASE, AMYLASE in the last 168 hours. No results for input(s): AMMONIA in the last 168 hours. Coagulation Profile: No results for input(s): INR, PROTIME in the last 168 hours. Cardiac Enzymes: No results for input(s): CKTOTAL, CKMB, CKMBINDEX, TROPONINI in the last 168 hours. BNP (last 3 results) No results for input(s): PROBNP in the last 8760 hours. HbA1C: No results for input(s): HGBA1C in the last 72 hours. CBG: No results for input(s): GLUCAP in the last 168 hours. Lipid Profile: No results for input(s): CHOL, HDL, LDLCALC, TRIG, CHOLHDL, LDLDIRECT in the last 72 hours. Thyroid Function Tests: No results for input(s): TSH, T4TOTAL, FREET4, T3FREE, THYROIDAB in the last 72 hours. Anemia Panel: No results for input(s): VITAMINB12, FOLATE, FERRITIN, TIBC, IRON, RETICCTPCT in the last 72 hours. Urine analysis:    Component Value Date/Time   COLORURINE YELLOW 05/12/2018 1703   APPEARANCEUR CLEAR 05/12/2018 1703   LABSPEC 1.013 05/12/2018 1703   PHURINE 6.0 05/12/2018 1703   GLUCOSEU NEGATIVE 05/12/2018 1703   HGBUR NEGATIVE 05/12/2018 1703   BILIRUBINUR NEGATIVE 05/12/2018 1703   KETONESUR 5 (A) 05/12/2018 1703   PROTEINUR NEGATIVE 05/12/2018 1703   UROBILINOGEN 0.2 04/27/2013 0940   NITRITE NEGATIVE 05/12/2018 1703   LEUKOCYTESUR NEGATIVE 05/12/2018 1703   Sepsis Labs: @LABRCNTIP (procalcitonin:4,lacticidven:4) )No results found for this or any previous visit (from the past 240 hour(s)).   Radiological Exams on Admission: Dg Chest 2 View  Result Date: 05/12/2018 CLINICAL DATA:  78 y/o  M; weakness, UTI, fever. EXAM: CHEST - 2 VIEW COMPARISON:  06/08/2015 chest radiograph FINDINGS: Mildly enlarged cardiac silhouette given projection and technique. Aortic calcific atherosclerosis. Ill-defined left basilar opacity. No pleural effusion or pneumothorax.  No acute osseous abnormality is evident.  Thoracic spine stimulators noted. IMPRESSION: Ill-defined left basilar opacity may represent atelectasis or pneumonia. Mildly enlarged cardiac silhouette. Electronically Signed   By: Kristine Garbe M.D.   On: 05/12/2018 22:03   Ct Lumbar Spine W Contrast  Result Date: 05/12/2018 CLINICAL DATA:  Back pain.  Fever. EXAM: CT LUMBAR SPINE WITH CONTRAST TECHNIQUE: Multidetector CT imaging of the lumbar spine was performed with intravenous contrast administration. CONTRAST:  156mL ISOVUE-300 IOPAMIDOL (ISOVUE-300) INJECTION 61% COMPARISON:  Lumbar spine radiographs 11/21/2016 FINDINGS: Segmentation: 5 non rib-bearing lumbar type vertebral bodies are present. The lowest fully formed vertebral body is L5. Alignment: Degenerative retrolisthesis is present at L2-3 and L3-4. There is slight retrolisthesis at L5-S1. Mild rightward curvature is present in the lower thoracic spine. Leftward curvature in the lower lumbar spine results in asymmetric right-sided endplate degenerative changes. Vertebrae: Asymmetric right-sided degenerative changes are noted in the lower lumbar spine associated with left-sided curvature. Vertebral body heights are maintained. Subchondral cysts are present without other focal lytic or blastic lesions. Paraspinal and other soft tissues: Atherosclerotic calcifications are present in the aorta and branch vessels. A 10 mm cyst is noted posteriorly in the left kidney. No other solid organ lesions are present. There is no significant adenopathy. Disc levels: Spinal cord stimulator enters the canal at T11-12. Thoracic spine canal and foramina are normal. L1-2: A vacuum disc is present. A broad-based disc protrusion is asymmetric to the left. Mild left subarticular and foraminal narrowing is present. L2-3: A vacuum disc is present. A broad-based disc protrusion is present. Mild subarticular narrowing is worse on the left. Moderate foraminal stenosis is worse on the right. L3-4: A broad-based disc  protrusion is present. Mild facet hypertrophy and ligamentum flavum thickening is noted. Moderate subarticular narrowing is present bilaterally. There is severe bilateral foraminal stenosis. L4-5: A broad-based disc protrusion is present. Laminectomy is noted. Severe right and moderate left foraminal stenosis is present. Moderate subarticular narrowing is present bilaterally. L5-S1: A broad-based disc protrusion is present. There is fusion across the disc space. Severe right moderate left foraminal stenosis is present. Subarticular narrowing is worse on the right. IMPRESSION: 1. No definite fluid collections or evidence for infection. No osseous erosion. 2. Extensive degenerative change with leftward curvature of the lumbar spine and asymmetric right-sided stenosis. Electronically Signed   By: San Morelle M.D.   On: 05/12/2018 19:01    EKG: Independently reviewed.  Normal sinus rhythm.  Nonspecific ST changes.  Assessment/Plan Principal Problem:   Community acquired pneumonia of left lower lobe of lung ([place]) Active Problems:   Obstructive sleep apnea   Essential hypertension   CAD (coronary artery disease)   Memory disorder   Ischemic cardiomyopathy   CAP (community acquired pneumonia)    1. Possible community-acquired pneumonia on ceftriaxone and Zithromax follow cultures urine for Legionella strep antigen flu panel was negative. 2. Weakness appears nonfocal at this time.  Get physical therapy consult. 3. CAD status post stenting denies any chest pain on aspirin beta blocker statins Imdur. 4. Hypertension on ARB hydrochlorothiazide beta-blockers. 5. Dementia on Namenda. 6. Sleep apnea on CPAP. 7. Cardiomyopathy last 2D echo had shown normal EF with EF of 55 to 60% with grade 1 diastolic dysfunction in 9983. 8. Potassium and creatinine was mildly increased.  Will recheck metabolic panel if there is persistent elevation may have to hold patient's ARB and hydrochlorothiazide.   DVT  prophylaxis: Lovenox. Code Status: Full code. Family Communication: No family at the  bedside. Disposition Plan: Home. Consults called: None. Admission status: Observation.   Rise Patience MD [facility] Pager [phone].  If 7PM-7AM, please contact night-coverage [email or web address] Password [facility]  05/13/2018, 12:32 AM

## 2018-05-13 NOTE — ED Notes (Signed)
ED TO INPATIENT HANDOFF REPORT  Name/Age/Gender Benjamin George 78 y.o. male  Code Status Code Status History    Date Active Date Inactive Code Status Order ID Comments User Context   01/21/2017 1349 01/21/2017 1851 Full Code [id number]  Milly Jakob, MD Inpatient   05/06/2013 1651 05/07/2013 1438 Full Code [id number]  Justice Britain, PA-C Inpatient      Home/SNF/Other Home  Chief Complaint UTI; Possible Sepsis  Level of Care/Admitting Diagnosis ED Disposition    ED Disposition Condition [facility] Area: [facility] [[id number]]  Level of Care: Telemetry [5]  Admit to tele based on following criteria: Monitor for Ischemic changes  Diagnosis: CAP (community acquired pneumonia) [431540]  Admitting Physician: Rise Patience [phone]  Attending Physician: Rise Patience [[id number]]  PT Class (Do Not Modify): Observation [104]  PT Acc Code (Do Not Modify): Observation [10022]       Medical History Past Medical History:  Diagnosis Date  . Arthritis   . Atrial fibrillation ([place])   . Cardiac arrest ([place])   . Complication of anesthesia   . Gout   . Gout   . Heart attack ([place])   . Heart disease   . HLD (hyperlipidemia)   . HTN (hypertension)   . Memory disorder 12/21/2015  . PONV (postoperative nausea and vomiting)   . Sleep apnea    NPSG 10/26/07 - AHI 16.4 used5 yrs, uses CPAP nightly    Allergies Allergies  Allergen Reactions  . Codeine Nausea And Vomiting  . Hydrocodone Nausea And Vomiting  . Aricept [Donepezil Hcl]     Nausea  . Oxycontin [Oxycodone Hcl]     Dizzy and uneasy  . Methocarbamol Nausea Only  . Other Nausea Only    vibramyacin    IV Location/Drains/Wounds Patient Lines/Drains/Airways Status   Active Line/Drains/Airways    Name:   Placement date:   Placement time:   Site:   Days:   Peripheral IV 05/12/18 Right Antecubital   05/12/18    1816    Antecubital   1   Peripheral IV 05/12/18 Left;Lateral  Forearm   05/12/18    2336    Forearm   1   Incision 04/17/11 Shoulder Left   04/17/11    1536     2583   Incision 05/06/13 Back Other (Comment)   05/06/13    0923     1833   Incision (Closed) 01/21/17 Arm Left   01/21/17    1408     477   Incision (Closed) 07/22/17 Arm Left   07/22/17    0945     295          Labs/Imaging Results for orders placed or performed during the hospital encounter of 05/12/18 (from the past 48 hour(s))  CBC with Differential     Status: Abnormal   Collection Time: 05/12/18  3:57 PM  Result Value Ref Range   WBC 10.0 4.0 - 10.5 K/uL   RBC 4.11 (L) 4.22 - 5.81 MIL/uL   Hemoglobin 12.9 (L) 13.0 - 17.0 g/dL   HCT 38.8 (L) 39.0 - 52.0 %   MCV 94.4 80.0 - 100.0 fL   MCH 31.4 26.0 - 34.0 pg   MCHC 33.2 30.0 - 36.0 g/dL   RDW 12.9 11.5 - 15.5 %   Platelets 187 150 - 400 K/uL   nRBC 0.0 0.0 - 0.2 %   Neutrophils Relative % 76 %   Neutro Abs 7.7  1.7 - 7.7 K/uL   Lymphocytes Relative 12 %   Lymphs Abs 1.2 0.7 - 4.0 K/uL   Monocytes Relative 9 %   Monocytes Absolute 0.9 0.1 - 1.0 K/uL   Eosinophils Relative 2 %   Eosinophils Absolute 0.2 0.0 - 0.5 K/uL   Basophils Relative 0 %   Basophils Absolute 0.0 0.0 - 0.1 K/uL   Immature Granulocytes 1 %   Abs Immature Granulocytes 0.05 0.00 - 0.07 K/uL    Comment: Performed at [facility], [place] [address], [place], [place] [address]  Comprehensive metabolic panel     Status: Abnormal   Collection Time: 05/12/18  3:57 PM  Result Value Ref Range   Sodium 133 (L) 135 - 145 mmol/L   Potassium 5.5 (H) 3.5 - 5.1 mmol/L    Comment: MODERATE HEMOLYSIS   Chloride 101 98 - 111 mmol/L   CO2 26 22 - 32 mmol/L   Glucose, Bld 96 70 - 99 mg/dL   BUN 21 8 - 23 mg/dL   Creatinine, Ser 1.25 (H) 0.61 - 1.24 mg/dL   Calcium 8.1 (L) 8.9 - 10.3 mg/dL   Total Protein 5.8 (L) 6.5 - 8.1 g/dL   Albumin 3.3 (L) 3.5 - 5.0 g/dL   AST 25 15 - 41 U/L   ALT 11 0 - 44 U/L   Alkaline Phosphatase 59 38 - 126 U/L   Total  Bilirubin 1.3 (H) 0.3 - 1.2 mg/dL   GFR calc non Af Amer 55 (L) >60 mL/min   GFR calc Af Amer >60 >60 mL/min   Anion gap 6 5 - 15    Comment: Performed at [facility], [place] [address], [place], [place] [address]  C-reactive protein     Status: Abnormal   Collection Time: 05/12/18  3:57 PM  Result Value Ref Range   CRP 1.7 (H) <1.0 mg/dL    Comment: Performed at [facility], [place] [address], [place], [place] [address]  Sedimentation rate     Status: None   Collection Time: 05/12/18  3:57 PM  Result Value Ref Range   Sed Rate 8 0 - 16 mm/hr    Comment: Performed at [facility], [place] [address] [place], [place] [address]  Urinalysis, Routine w reflex microscopic     Status: Abnormal   Collection Time: 05/12/18  5:03 PM  Result Value Ref Range   Color, Urine YELLOW YELLOW   APPearance CLEAR CLEAR   Specific Gravity, Urine 1.013 1.005 - 1.030   pH 6.0 5.0 - 8.0   Glucose, UA NEGATIVE NEGATIVE mg/dL   Hgb urine dipstick NEGATIVE NEGATIVE   Bilirubin Urine NEGATIVE NEGATIVE   Ketones, ur 5 (A) NEGATIVE mg/dL   Protein, ur NEGATIVE NEGATIVE mg/dL   Nitrite NEGATIVE NEGATIVE   Leukocytes, UA NEGATIVE NEGATIVE    Comment: Performed at [place] [address], [place], [place] [address]  Influenza panel by PCR (type A & B)     Status: None   Collection Time: 05/12/18  7:08 PM  Result Value Ref Range   Influenza A By PCR NEGATIVE NEGATIVE   Influenza B By PCR NEGATIVE NEGATIVE    Comment: (NOTE) The Xpert Xpress Flu assay is intended as an aid in the diagnosis of  influenza and should not be used as a sole basis for treatment.  This  assay is FDA approved for nasopharyngeal swab specimens only. Nasal  washings and aspirates are unacceptable  for Xpert Xpress Flu testing. Performed at [facility], [place] [address], [place], [place] [address]   I-stat troponin, ED     Status: None    Collection Time: 05/12/18 10:22 PM  Result Value Ref Range   Troponin i, poc 0.02 0.00 - 0.08 ng/mL   Comment 3            Comment: Due to the release kinetics of cTnI, a negative result within the first hours of the onset of symptoms does not rule out myocardial infarction with certainty. If myocardial infarction is still suspected, repeat the test at appropriate intervals.    Dg Chest 2 View  Result Date: 05/12/2018 CLINICAL DATA:  78 y/o  M; weakness, UTI, fever. EXAM: CHEST - 2 VIEW COMPARISON:  06/08/2015 chest radiograph FINDINGS: Mildly enlarged cardiac silhouette given projection and technique. Aortic calcific atherosclerosis. Ill-defined left basilar opacity. No pleural effusion or pneumothorax. No acute osseous abnormality is evident. Thoracic spine stimulators noted. IMPRESSION: Ill-defined left basilar opacity may represent atelectasis or pneumonia. Mildly enlarged cardiac silhouette. Electronically Signed   By: Kristine Garbe M.D.   On: 05/12/2018 22:03   Ct Lumbar Spine W Contrast  Result Date: 05/12/2018 CLINICAL DATA:  Back pain.  Fever. EXAM: CT LUMBAR SPINE WITH CONTRAST TECHNIQUE: Multidetector CT imaging of the lumbar spine was performed with intravenous contrast administration. CONTRAST:  172mL ISOVUE-300 IOPAMIDOL (ISOVUE-300) INJECTION 61% COMPARISON:  Lumbar spine radiographs 11/21/2016 FINDINGS: Segmentation: 5 non rib-bearing lumbar type vertebral bodies are present. The lowest fully formed vertebral body is L5. Alignment: Degenerative retrolisthesis is present at L2-3 and L3-4. There is slight retrolisthesis at L5-S1. Mild rightward curvature is present in the lower thoracic spine. Leftward curvature in the lower lumbar spine results in asymmetric right-sided endplate degenerative changes. Vertebrae: Asymmetric right-sided degenerative changes are noted in the lower lumbar spine associated with left-sided curvature. Vertebral body heights are maintained.  Subchondral cysts are present without other focal lytic or blastic lesions. Paraspinal and other soft tissues: Atherosclerotic calcifications are present in the aorta and branch vessels. A 10 mm cyst is noted posteriorly in the left kidney. No other solid organ lesions are present. There is no significant adenopathy. Disc levels: Spinal cord stimulator enters the canal at T11-12. Thoracic spine canal and foramina are normal. L1-2: A vacuum disc is present. A broad-based disc protrusion is asymmetric to the left. Mild left subarticular and foraminal narrowing is present. L2-3: A vacuum disc is present. A broad-based disc protrusion is present. Mild subarticular narrowing is worse on the left. Moderate foraminal stenosis is worse on the right. L3-4: A broad-based disc protrusion is present. Mild facet hypertrophy and ligamentum flavum thickening is noted. Moderate subarticular narrowing is present bilaterally. There is severe bilateral foraminal stenosis. L4-5: A broad-based disc protrusion is present. Laminectomy is noted. Severe right and moderate left foraminal stenosis is present. Moderate subarticular narrowing is present bilaterally. L5-S1: A broad-based disc protrusion is present. There is fusion across the disc space. Severe right moderate left foraminal stenosis is present. Subarticular narrowing is worse on the right. IMPRESSION: 1. No definite fluid collections or evidence for infection. No osseous erosion. 2. Extensive degenerative change with leftward curvature of the lumbar spine and asymmetric right-sided stenosis. Electronically Signed   By: San Morelle M.D.   On: 05/12/2018 19:01   EKG Interpretation  Date/Time:  Monday May 12 2018 18:58:52 EST Ventricular Rate:  67 PR Interval:    QRS Duration: 111 QT Interval:  417 QTC  Calculation: 441 R Axis:   68 Text Interpretation:  Sinus or ectopic atrial rhythm Incomplete left bundle branch block Confirmed by Davonna Belling [phone])  on 05/12/2018 10:26:59 PM   Pending Labs Unresulted Labs (From admission, onward)    Start     Ordered   05/12/18 1534  Urine culture  ONCE - STAT,   STAT     05/12/18 1534          Vitals/Pain Today's Vitals   05/12/18 2339 05/12/18 2348 05/13/18 0000 05/13/18 0000  BP: (!) 145/68  (!) 146/72   Pulse:  67 68   Resp:  18 17   Temp:      TempSrc:      SpO2:  92% 94%   Weight:      Height:      PainSc:    4     Isolation Precautions Droplet precaution  Medications Medications  iopamidol (ISOVUE-300) 61 % injection (has no administration in time range)  sodium chloride (PF) 0.9 % injection (has no administration in time range)  cefTRIAXone (ROCEPHIN) 1 g in sodium chloride 0.9 % 100 mL IVPB (1 g Intravenous New Bag/Given 05/12/18 2338)  azithromycin (ZITHROMAX) 500 mg in sodium chloride 0.9 % 250 mL IVPB (has no administration in time range)  fentaNYL (SUBLIMAZE) injection 50 mcg (50 mcg Intravenous Given 05/12/18 1557)  ondansetron (ZOFRAN) injection 4 mg (4 mg Intravenous Given 05/12/18 1557)  iopamidol (ISOVUE-300) 61 % injection 100 mL (100 mLs Intravenous Contrast Given 05/12/18 1823)  ondansetron (ZOFRAN) injection 4 mg (4 mg Intravenous Given 05/12/18 2019)    Mobility walks

## 2018-05-13 NOTE — Care Management Note (Signed)
Case Management Note  Patient Details  Name: Benjamin George MRN: [id number] Date of Birth: [birth year]  Subjective/Objective:PT recc HHPT, TC spouse Benjamin George-informed of OBS status, & HHC-chose [facility] rep Benjamin George aware of referral, HHPT to be ordered. Patient has a rw. No further CM needs.                    Action/Plan:dc home w/HHC.   Expected Discharge Date:                  Expected Discharge Plan:  [place]  In-House Referral:     Discharge planning Services  CM Consult  Post Acute Care Choice:  Durable Medical Equipment(has rw) Choice offered to:  Spouse  DME Arranged:    DME Agency:     HH Arranged:  PT HH Agency:  [place]  Status of Service:  Completed, signed off  If discussed at [place] [facility] of Stay Meetings, dates discussed:    Additional Comments:  Dessa Phi, RN 05/13/2018, 4:08 PM

## 2018-05-14 DIAGNOSIS — R413 Other amnesia: Secondary | ICD-10-CM

## 2018-05-14 DIAGNOSIS — I2510 Atherosclerotic heart disease of native coronary artery without angina pectoris: Secondary | ICD-10-CM

## 2018-05-14 DIAGNOSIS — G4733 Obstructive sleep apnea (adult) (pediatric): Secondary | ICD-10-CM

## 2018-05-14 DIAGNOSIS — I959 Hypotension, unspecified: Secondary | ICD-10-CM

## 2018-05-14 DIAGNOSIS — I255 Ischemic cardiomyopathy: Secondary | ICD-10-CM

## 2018-05-14 LAB — BASIC METABOLIC PANEL
Anion gap: 10 (ref 5–15)
BUN: 18 mg/dL (ref 8–23)
CO2: 22 mmol/L (ref 22–32)
Calcium: 8.3 mg/dL — ABNORMAL LOW (ref 8.9–10.3)
Chloride: 103 mmol/L (ref 98–111)
Creatinine, Ser: 1.04 mg/dL (ref 0.61–1.24)
GFR calc Af Amer: >60 mL/min (ref >60)
GFR calc non Af Amer: >60 mL/min (ref >60)
Glucose, Bld: 98 mg/dL (ref 70–99)
Potassium: 3.7 mmol/L (ref 3.5–5.1)
Sodium: 135 mmol/L (ref 135–145)

## 2018-05-14 LAB — CBC
HCT: 37.5 % — ABNORMAL LOW (ref 39.0–52.0)
Hemoglobin: 12.4 g/dL — ABNORMAL LOW (ref 13.0–17.0)
MCH: 30 pg (ref 26.0–34.0)
MCHC: 33.1 g/dL (ref 30.0–36.0)
MCV: 90.8 fL (ref 80.0–100.0)
Platelets: 184 10*3/uL (ref 150–400)
RBC: 4.13 MIL/uL — ABNORMAL LOW (ref 4.22–5.81)
RDW: 12.5 % (ref 11.5–15.5)
WBC: 7.5 10*3/uL (ref 4.0–10.5)
nRBC: 0 % (ref 0.0–0.2)

## 2018-05-14 LAB — URINE CULTURE: Culture: <10000 — AB

## 2018-05-14 MED ORDER — GUAIFENESIN ER 600 MG PO TB12
1200.0000 mg | ORAL_TABLET | Freq: Two times a day (BID) | ORAL | Status: DC
  Administered 2018-05-14 – 2018-05-17 (×7): 1200 mg via ORAL
  Filled 2018-05-14 (×7): qty 2

## 2018-05-14 MED ORDER — SODIUM CHLORIDE 0.9 % IV SOLN
INTRAVENOUS | Status: DC
  Administered 2018-05-14 – 2018-05-15 (×2): via INTRAVENOUS

## 2018-05-14 MED ORDER — AZITHROMYCIN 250 MG PO TABS
500.0000 mg | ORAL_TABLET | Freq: Every day | ORAL | Status: DC
  Administered 2018-05-14 – 2018-05-17 (×4): 500 mg via ORAL
  Filled 2018-05-14 (×4): qty 2

## 2018-05-14 NOTE — Care Management Note (Signed)
Case Management Note  Patient Details  Name: Benjamin George MRN: [id number] Date of Birth: [birth year]  Subjective/Objective:  PNA. From home. Spoke to family-he does not have a rw-[facility] rep Santiago Glad aware of order to deliver to rm prior d/c. [facility] rep Drew following for HHPT.                 Action/Plan:d/c home w/HHC/dme   Expected Discharge Date:                  Expected Discharge Plan:  [place]  In-House Referral:     Discharge planning Services  CM Consult  Post Acute Care Choice:    Choice offered to:  Spouse  DME Arranged:    DME Agency:     HH Arranged:  PT HH Agency:  [place]  Status of Service:  Completed, signed off  If discussed at [place] of Stay Meetings, dates discussed:    Additional Comments:  Dessa Phi, RN 05/14/2018, 11:32 AM

## 2018-05-14 NOTE — Progress Notes (Signed)
PHARMACIST - PHYSICIAN COMMUNICATION DR:  Grandville Silos CONCERNING: Antibiotic IV to Oral Route Change Policy  RECOMMENDATION: This patient is receiving zithromax by the intravenous route.  Based on criteria approved by the Pharmacy and Therapeutics Committee, the antibiotic(s) is/are being converted to the equivalent oral dose form(s).   DESCRIPTION: These criteria include:  Patient being treated for a respiratory tract infection, urinary tract infection, cellulitis or clostridium difficile associated diarrhea if on metronidazole  The patient is not neutropenic and does not exhibit a GI malabsorption state  The patient is eating (either orally or via tube) and/or has been taking other orally administered medications for a least 24 hours  The patient is improving clinically and has a Tmax < 100.5  If you have questions about this conversion, please contact the Pharmacy Department  []   [phone] )  [facility] []   [phone] )  [facility] []   [phone] )  [facility] []   [phone] )  [facility] [x]   [phone] )  [place], PharmD, BCPS 05/14/2018 10:31 AM

## 2018-05-14 NOTE — Plan of Care (Signed)
  Problem: Education: Goal: Knowledge of General Education information will improve Description: Including pain rating scale, medication(s)/side effects and non-pharmacologic comfort measures Outcome: Progressing   Problem: Clinical Measurements: Goal: Respiratory complications will improve Outcome: Progressing Goal: Cardiovascular complication will be avoided Outcome: Progressing   Problem: Safety: Goal: Ability to remain free from injury will improve Outcome: Progressing   

## 2018-05-14 NOTE — Progress Notes (Signed)
Physical Therapy Treatment Patient Details Name: Benjamin George MRN: [id number] DOB: [birth year] Today's Date: 05/14/2018    History of Present Illness Benjamin George is a 78 y.o. male with history of dementia, CAD status post remote stenting, cardiomyopathy , back surgeries, spine stimulator,was brought to the ER the patient was feeling weak, difficulty walking and confused    PT Comments    The patient  Continues to have decreased balance with ambulation, requires a RW and assist . Patient is not anywhere near  His baseline. Patient's wife reports no other caregivers and that she may not be home 24/7. Relayed this to Care management. BP sitting 111/70, standing 107/59, standing 3 minutes 111/60, after ambulation x 50' 90/53.   Patient requires frequent cues  For redirection. Patient  Stated" I am going to the gym after I get home."  Follow Up Recommendations  SNF     Equipment Recommendations  Rolling walker with 5" wheels    Recommendations for Other Services       Precautions / Restrictions Precautions Precautions: Fall    Mobility  Bed Mobility               General bed mobility comments: OOB  Transfers Overall transfer level: Needs assistance Equipment used: Rolling walker (2 wheeled) Transfers: Sit to/from Stand Sit to Stand: Min assist;Mod assist         General transfer comment: assist to rise from recliner second time required mod assist.  Multimodal cues  for safety, when 5 feet from recliner, patient let go of Rw and had more difficulty taking steps to recliner.  Ambulation/Gait Ambulation/Gait assistance: Min assist;Mod assist Gait Distance (Feet): 50 Feet Assistive device: Rolling walker (2 wheeled) Gait Pattern/deviations: Step-to pattern;Step-through pattern;Staggering left;Decreased stride length Gait velocity: decr   General Gait Details: patient had  decreased balance, shuffle steps , trunk flexed.   Stairs              Wheelchair Mobility    Modified Rankin (Stroke Patients Only)       Balance Overall balance assessment: History of Falls;Needs assistance Sitting-balance support: No upper extremity supported;Feet supported Sitting balance-Leahy Scale: Good     Standing balance support: During functional activity;No upper extremity supported Standing balance-Leahy Scale: Poor Standing balance comment: needs external support                            Cognition Arousal/Alertness: Awake/alert   Overall Cognitive Status: Impaired/Different from baseline(has a history of demntia but drove to gym) Area of Impairment: Orientation;Memory;Safety/judgement;Awareness                 Orientation Level: Situation   Memory: Decreased short-term memory         General Comments: wife frequently cueing the patient      Exercises      General Comments        Pertinent Vitals/Pain Faces Pain Scale: Hurts even more Pain Location: back Pain Descriptors / Indicators: Aching Pain Intervention(s): Monitored during session    Home Living                      Prior Function            PT Goals (current goals can now be found in the care plan section) Progress towards PT goals: Progressing toward goals    Frequency    Min 3X/week      PT  Plan Discharge plan needs to be updated    Co-evaluation              AM-PAC PT "6 Clicks" Mobility   Outcome Measure  Help needed turning from your back to your side while in a flat bed without using bedrails?: A Lot Help needed moving from lying on your back to sitting on the side of a flat bed without using bedrails?: A Lot Help needed moving to and from a bed to a chair (including a wheelchair)?: A Lot Help needed standing up from a chair using your arms (e.g., wheelchair or bedside chair)?: A Lot Help needed to walk in hospital room?: A Lot Help needed climbing 3-5 steps with a railing? : A Lot 6 Click  Score: 12    End of Session Equipment Utilized During Treatment: Gait belt Activity Tolerance: Patient limited by pain Patient left: in chair;with call bell/phone within reach;with family/visitor present;with chair alarm set Nurse Communication: Mobility status PT Visit Diagnosis: Unsteadiness on feet (R26.81);History of falling (Z91.81)     Time: 3329-5188 PT Time Calculation (min) (ACUTE ONLY): 35 min  Charges:  $Gait Training: 23-37 mins                     Tresa Endo PT Acute Rehabilitation Services Pager [phone] Office [phone]    Claretha Cooper 05/14/2018, 12:55 PM

## 2018-05-14 NOTE — Care Management Note (Signed)
Case Management Note  Patient Details  Name: Benjamin George MRN: [id number] Date of Birth: Mar 29, 1941  Subjective/Objective: Spoke to PT-recc SNF(she will put in note). CM have spoken to patient/spouse about SNF-they agree to SNF if needed-MD/CSW notified.                   Action/Plan:dc SNF.   Expected Discharge Date:                  Expected Discharge Plan:  Skilled Nursing Facility  In-House Referral:  Clinical Social Work  Discharge planning Services  CM Consult  Post Acute Care Choice:    Choice offered to:  Spouse  DME Arranged:    DME Agency:     HH Arranged:    [place] Agency:     Status of Service:  In process, will continue to follow  If discussed at Long Length of Stay Meetings, dates discussed:    Additional Comments:  Dessa Phi, RN 05/14/2018, 12:48 PM

## 2018-05-14 NOTE — Progress Notes (Signed)
CM informed [facility] rep Dian Situ of d/c plans SNF, & [facility] rep Santiago Glad of dme-to pick up rw from rm.

## 2018-05-14 NOTE — Evaluation (Signed)
Occupational Therapy Evaluation Patient Details Name: Benjamin George MRN: [id number] DOB: [birth year] Today's Date: 05/14/2018    History of Present Illness Benjamin George is a 78 y.o. male with history of dementia, CAD status post remote stenting, cardiomyopathy , back surgeries, spine stimulator,was brought to the ER the patient was feeling weak, difficulty walking and confused   Clinical Impression   Pt admitted with weakness and the above. Pt currently with functional limitations due to the deficits listed below (see OT Problem List).  Pt will benefit from skilled OT to increase their safety and independence with ADL and functional mobility for ADL to facilitate discharge to venue listed below. Pts wife wants pt to be more I prior to DC home.       Follow Up Recommendations  SNF    Equipment Recommendations  None recommended by OT       Precautions / Restrictions Precautions Precautions: Fall      Mobility Bed Mobility               General bed mobility comments: OOB  Transfers Overall transfer level: Needs assistance Equipment used: Rolling walker (2 wheeled) Transfers: Sit to/from Stand Sit to Stand: Min assist         General transfer comment: VC for hand placement    Balance Overall balance assessment: History of Falls;Needs assistance Sitting-balance support: No upper extremity supported;Feet supported Sitting balance-Leahy Scale: Good     Standing balance support: During functional activity;No upper extremity supported Standing balance-Leahy Scale: Poor Standing balance comment: needs external support                           ADL either performed or assessed with clinical judgement   ADL Overall ADL's : Needs assistance/impaired Eating/Feeding: Set up;Sitting   Grooming: Set up;Sitting   Upper Body Bathing: Sitting;Minimal assistance   Lower Body Bathing: Maximal assistance;Sit to/from stand;Cueing for sequencing;Cueing for  safety   Upper Body Dressing : Minimal assistance;Sitting   Lower Body Dressing: Maximal assistance;Sit to/from stand;Cueing for sequencing;Cueing for safety   Toilet Transfer: Minimal assistance;BSC;Comfort height toilet;Stand-pivot   Toileting- Clothing Manipulation and Hygiene: Sit to/from stand;Cueing for sequencing;Cueing for safety;Moderate assistance               Vision Patient Visual Report: No change from baseline              Pertinent Vitals/Pain Pain Score: 3  Faces Pain Scale: Hurts even more Pain Location: back Pain Descriptors / Indicators: Sore Pain Intervention(s): Limited activity within patient's tolerance;Monitored during session;Repositioned        Extremity/Trunk Assessment         Cervical / Trunk Assessment Cervical / Trunk Assessment: Normal   Communication Communication Communication: No difficulties   Cognition Arousal/Alertness: Awake/alert   Overall Cognitive Status: Impaired/Different from baseline(has a history of demntia but drove to gym) Area of Impairment: Orientation;Memory;Safety/judgement;Awareness                 Orientation Level: Situation   Memory: Decreased short-term memory         General Comments: wife frequently cueing the patient   General Comments               Home Living Family/patient expects to be discharged to:: Private residence Living Arrangements: Spouse/significant other Available Help at Discharge: Family Type of Home: House Home Access: Stairs to enter [employer] of Steps: 3 Entrance Stairs-Rails: None Home  Layout: Full bath on main level     Bathroom Shower/Tub: Occupational psychologist: Standard     Home Equipment: Cane - single point;Walker - 2 wheels          Prior Functioning/Environment Level of Independence: Independent        Comments: drives, goes to gym        OT Problem List: Decreased strength;Decreased activity  tolerance;Impaired balance (sitting and/or standing);Decreased knowledge of use of DME or AE;Decreased safety awareness      OT Treatment/Interventions: Self-care/ADL training;Patient/family education;DME and/or AE instruction;Therapeutic activities    OT Goals(Current goals can be found in the care plan section) Acute Rehab OT Goals Patient Stated Goal: did not state OT Goal Formulation: With patient Time For Goal Achievement: 05/28/18 Potential to Achieve Goals: Good  OT Frequency: Min 2X/week   Barriers to D/C:            Co-evaluation              AM-PAC OT "6 Clicks" Daily Activity     Outcome Measure Help from another person eating meals?: None Help from another person taking care of personal grooming?: A Little Help from another person toileting, which includes using toliet, bedpan, or urinal?: A Lot Help from another person bathing (including washing, rinsing, drying)?: A Lot Help from another person to put on and taking off regular upper body clothing?: A Little Help from another person to put on and taking off regular lower body clothing?: A Lot 6 Click Score: 16   End of Session Equipment Utilized During Treatment: Rolling walker Nurse Communication: Mobility status  Activity Tolerance: Patient tolerated treatment well Patient left: in chair;with chair alarm set;with call bell/phone within reach  OT Visit Diagnosis: Unsteadiness on feet (R26.81);Muscle weakness (generalized) (M62.81);Other abnormalities of gait and mobility (R26.89)                Time: 5784-6962 OT Time Calculation (min): 21 min Charges:  OT General Charges $OT Visit: 1 Visit OT Evaluation $OT Eval Moderate Complexity: 1 Mod  Kari Baars, [place] [phone] Office- [phone]     Delaney Perona, Edwena Felty D 05/14/2018, 2:42 PM

## 2018-05-14 NOTE — Progress Notes (Addendum)
PROGRESS NOTE    Benjamin George  GEZ:[id number] DOB: 12-Oct-1940 DOA: 05/12/2018 PCP: [name], MD    Brief Narrative:  Altonio Schwertner Rosettais a 78 y.o.malewithhistory of dementia, CAD status post remote stenting, cardiomyopathy with improvement in EF last 2D echo showed EF of 55 to 60% with grade 1 diastolic dysfunction in 6503 was brought to the ER with generalized weakness, urinary symptoms.   ED Course:In the ER patient was confused, had one episode of nausea vomiting. CT lumbar spine was negative for anything acute chest x-ray shows possibility of pneumonia. UA is unremarkable.  Patient admitted placed empirically on IV antibiotics.  On 05/13/2018 patient noted to be hypotensive with systolic blood pressures in the 90s.   Assessment & Plan:   Principal Problem:   Community acquired pneumonia of left lower lobe of lung ([place]) Active Problems:   Obstructive sleep apnea   Essential hypertension   CAD (coronary artery disease)   Memory disorder   Ischemic cardiomyopathy   CAP (community acquired pneumonia)  1 community-acquired pneumonia Patient had presented with significant generalized weakness, fever, chest x-ray concerning for pneumonia.  Influenza PCR negative.  Urine strep pneumococcus antigen negative.  Fever curve trended down.  Patient improving clinically slowly.  Continue IV Rocephin.  IV azithromycin has been changed to oral azithromycin.  Placed on Mucinex.  Supportive care.  Follow.  2.  Hypotension Patient noted to be hypotensive on 05/13/2018 in the afternoon with systolic blood pressures in the 90s.  Blood pressure with some improvement this morning however this afternoon systolic blood pressures back in the 90s.  Patient still with weakness.  Patient with slow response to questions.  Will discontinue Imdur, HCTZ, Cozaar and place on gentle hydration and follow.  3.  Dementia Continue Namenda.  Continue treatment for community-acquired pneumonia.  Follow.  4.   Hypertension Patient noted to be hypotensive yesterday afternoon to 05/13/2018.  Patient hypotensive this afternoon.  Will discontinue Cozaar, HCTZ, Imdur. Follow.  5.  Weakness Patient with significant generalized weakness.  Patient seen by physical therapy who are recommending skilled nursing facility as patient seems unsafe to be discharged home with home health at this time and patient not at his baseline.  PT/OT.  6.  Obstructive sleep apnea CPAP nightly.  7.  Coronary artery disease status post stenting/cardiomyopathy Per last 2D echo EF 55 to 60% with grade 1 diastolic dysfunction.  Patient denies any chest pain.  Patient euvolemic on examination.  Due to patient's hypotension will discontinue Imdur, Cozaar, HCTZ.  Continue Tylenol for now.  Follow.   DVT prophylaxis: Lovenox Code Status: Full Family Communication: Updated patient and wife at bedside. Disposition Plan: Skilled nursing facility once medically stable, resolution of hypotension and clinical improvement.   Consultants:   None  Procedures:   CT L-spine 05/12/2018  Chest x-ray 05/12/2018  Antimicrobials:   IV azithromycin 05/12/2018>>>> 05/14/2018  IV Rocephin 05/12/2018  Oral azithromycin 05/14/2018   Subjective: Patient sitting up in chair.  Patient with complaints of weakness.  Feels shortness of breath improving.  Feeling somewhat better than on admission however not at baseline.  Patient somewhat confused with some slow response to questions.  Objective: Vitals:   05/13/18 1357 05/13/18 2120 05/14/18 0433 05/14/18 0434  BP: (!) 106/56 (!) 156/60 (!) 161/68 (!) 161/68  Pulse: 62 61 61 61  Resp: 20 12 20 20   Temp: 98.6 F (37 C) 98.2 F (36.8 C) 97.7 F (36.5 C) 97.7 F (36.5 C)  TempSrc: Oral Oral  Oral Oral  SpO2: 96% 95% 94% 93%  Weight:      Height:        Intake/Output Summary (Last 24 hours) at 05/14/2018 1142 Last data filed at 05/13/2018 2132 Gross per 24 hour  Intake 245.54 ml  Output -  Net  245.54 ml   Filed Weights   05/12/18 1450 05/13/18 0035  Weight: 110.3 kg 107.1 kg    Examination:  General exam: Appears calm and comfortable  Respiratory system: left basilar coarse BS. Respiratory effort normal. Cardiovascular system: S1 & S2 heard, RRR. No JVD, murmurs, rubs, gallops or clicks. No pedal edema. Gastrointestinal system: Abdomen is nondistended, soft and nontender. No organomegaly or masses felt. Normal bowel sounds heard. Central nervous system: Alert and oriented. No focal neurological deficits. Extremities: Symmetric 5 x 5 power. Skin: No rashes, lesions or ulcers Psychiatry: Judgement and insight appear poor to fair. Mood & affect appropriate.     Data Reviewed: I have personally reviewed following labs and imaging studies  CBC: Recent Labs  Lab 05/12/18 1557 05/13/18 0057 05/14/18 0552  WBC 10.0 8.4 7.5  NEUTROABS 7.7  --   --   HGB 12.9* 12.9* 12.4*  HCT 38.8* 39.0 37.5*  MCV 94.4 93.5 90.8  PLT 187 159 932   Basic Metabolic Panel: Recent Labs  Lab 05/12/18 1557 05/13/18 0057 05/14/18 0552  NA 133* 135 135  K 5.5* 4.0 3.7  CL 101 103 103  CO2 26 23 22   GLUCOSE 96 141* 98  BUN 21 17 18   CREATININE 1.25* 1.00 1.04  CALCIUM 8.1* 8.2* 8.3*   GFR: Estimated Creatinine Clearance: 74 mL/min (by C-G formula based on SCr of 1.04 mg/dL). Liver Function Tests: Recent Labs  Lab 05/12/18 1557  AST 25  ALT 11  ALKPHOS 59  BILITOT 1.3*  PROT 5.8*  ALBUMIN 3.3*   No results for input(s): LIPASE, AMYLASE in the last 168 hours. No results for input(s): AMMONIA in the last 168 hours. Coagulation Profile: No results for input(s): INR, PROTIME in the last 168 hours. Cardiac Enzymes: Recent Labs  Lab 05/13/18 0057 05/13/18 0640 05/13/18 1204  TROPONINI <0.03 <0.03 <0.03   BNP (last 3 results) No results for input(s): PROBNP in the last 8760 hours. HbA1C: No results for input(s): HGBA1C in the last 72 hours. CBG: No results for  input(s): GLUCAP in the last 168 hours. Lipid Profile: No results for input(s): CHOL, HDL, LDLCALC, TRIG, CHOLHDL, LDLDIRECT in the last 72 hours. Thyroid Function Tests: Recent Labs    05/13/18 0057  TSH 0.853   Anemia Panel: No results for input(s): VITAMINB12, FOLATE, FERRITIN, TIBC, IRON, RETICCTPCT in the last 72 hours. Sepsis Labs: No results for input(s): PROCALCITON, LATICACIDVEN in the last 168 hours.  Recent Results (from the past 240 hour(s))  Urine culture     Status: Abnormal   Collection Time: 05/12/18  5:03 PM  Result Value Ref Range Status   Specimen Description   Final    URINE, RANDOM Performed at [facility] [address], [place], [place] [address]    Special Requests   Final    NONE Performed at [facility], [place] [address] [place], [place] [address]    Culture (A)  Final    <10,000 COLONIES/mL INSIGNIFICANT GROWTH Performed at [facility] [address], [place],  [address]    Report Status 05/14/2018 FINAL  Final         Radiology Studies: Dg  Chest 2 View  Result Date: 05/12/2018 CLINICAL DATA:  78 y/o  M; weakness, UTI, fever. EXAM: CHEST - 2 VIEW COMPARISON:  06/08/2015 chest radiograph FINDINGS: Mildly enlarged cardiac silhouette given projection and technique. Aortic calcific atherosclerosis. Ill-defined left basilar opacity. No pleural effusion or pneumothorax. No acute osseous abnormality is evident. Thoracic spine stimulators noted. IMPRESSION: Ill-defined left basilar opacity may represent atelectasis or pneumonia. Mildly enlarged cardiac silhouette. Electronically Signed   By: Kristine Garbe M.D.   On: 05/12/2018 22:03   Ct Lumbar Spine W Contrast  Result Date: 05/12/2018 CLINICAL DATA:  Back pain.  Fever. EXAM: CT LUMBAR SPINE WITH CONTRAST TECHNIQUE: Multidetector CT imaging of the lumbar spine was performed with intravenous contrast administration. CONTRAST:  172mL  ISOVUE-300 IOPAMIDOL (ISOVUE-300) INJECTION 61% COMPARISON:  Lumbar spine radiographs 11/21/2016 FINDINGS: Segmentation: 5 non rib-bearing lumbar type vertebral bodies are present. The lowest fully formed vertebral body is L5. Alignment: Degenerative retrolisthesis is present at L2-3 and L3-4. There is slight retrolisthesis at L5-S1. Mild rightward curvature is present in the lower thoracic spine. Leftward curvature in the lower lumbar spine results in asymmetric right-sided endplate degenerative changes. Vertebrae: Asymmetric right-sided degenerative changes are noted in the lower lumbar spine associated with left-sided curvature. Vertebral body heights are maintained. Subchondral cysts are present without other focal lytic or blastic lesions. Paraspinal and other soft tissues: Atherosclerotic calcifications are present in the aorta and branch vessels. A 10 mm cyst is noted posteriorly in the left kidney. No other solid organ lesions are present. There is no significant adenopathy. Disc levels: Spinal cord stimulator enters the canal at T11-12. Thoracic spine canal and foramina are normal. L1-2: A vacuum disc is present. A broad-based disc protrusion is asymmetric to the left. Mild left subarticular and foraminal narrowing is present. L2-3: A vacuum disc is present. A broad-based disc protrusion is present. Mild subarticular narrowing is worse on the left. Moderate foraminal stenosis is worse on the right. L3-4: A broad-based disc protrusion is present. Mild facet hypertrophy and ligamentum flavum thickening is noted. Moderate subarticular narrowing is present bilaterally. There is severe bilateral foraminal stenosis. L4-5: A broad-based disc protrusion is present. Laminectomy is noted. Severe right and moderate left foraminal stenosis is present. Moderate subarticular narrowing is present bilaterally. L5-S1: A broad-based disc protrusion is present. There is fusion across the disc space. Severe right moderate left  foraminal stenosis is present. Subarticular narrowing is worse on the right. IMPRESSION: 1. No definite fluid collections or evidence for infection. No osseous erosion. 2. Extensive degenerative change with leftward curvature of the lumbar spine and asymmetric right-sided stenosis. Electronically Signed   By: San Morelle M.D.   On: 05/12/2018 19:01        Scheduled Meds: . allopurinol  100 mg Oral q morning - 10a  . aspirin EC  81 mg Oral Daily  . atenolol  50 mg Oral Daily  . atorvastatin  20 mg Oral q1800  . azithromycin  500 mg Oral Daily  . enoxaparin (LOVENOX) injection  40 mg Subcutaneous Daily  . isosorbide mononitrate  30 mg Oral Daily  . linaclotide  290 mcg Oral QAC breakfast  . losartan  100 mg Oral Daily  . memantine  10 mg Oral BID  . vitamin B-12  200 mcg Oral q morning - 10a   Continuous Infusions: . cefTRIAXone (ROCEPHIN)  IV 1 g (05/13/18 1720)     LOS: 0 days    Time spent: 35 minutes    Irine Seal,  MD [facility]  If 7PM-7AM, please contact night-coverage [email or web address] 05/14/2018, 11:42 AM

## 2018-05-15 DIAGNOSIS — Z7982 Long term (current) use of aspirin: Secondary | ICD-10-CM | POA: Diagnosis not present

## 2018-05-15 DIAGNOSIS — Z8349 Family history of other endocrine, nutritional and metabolic diseases: Secondary | ICD-10-CM | POA: Diagnosis not present

## 2018-05-15 DIAGNOSIS — I509 Heart failure, unspecified: Secondary | ICD-10-CM | POA: Diagnosis present

## 2018-05-15 DIAGNOSIS — E785 Hyperlipidemia, unspecified: Secondary | ICD-10-CM | POA: Diagnosis present

## 2018-05-15 DIAGNOSIS — Z96612 Presence of left artificial shoulder joint: Secondary | ICD-10-CM | POA: Diagnosis present

## 2018-05-15 DIAGNOSIS — G4700 Insomnia, unspecified: Secondary | ICD-10-CM | POA: Diagnosis present

## 2018-05-15 DIAGNOSIS — R2689 Other abnormalities of gait and mobility: Secondary | ICD-10-CM | POA: Diagnosis not present

## 2018-05-15 DIAGNOSIS — I10 Essential (primary) hypertension: Secondary | ICD-10-CM | POA: Diagnosis not present

## 2018-05-15 DIAGNOSIS — Z79891 Long term (current) use of opiate analgesic: Secondary | ICD-10-CM | POA: Diagnosis not present

## 2018-05-15 DIAGNOSIS — Z8249 Family history of ischemic heart disease and other diseases of the circulatory system: Secondary | ICD-10-CM | POA: Diagnosis not present

## 2018-05-15 DIAGNOSIS — Z9682 Presence of neurostimulator: Secondary | ICD-10-CM | POA: Diagnosis not present

## 2018-05-15 DIAGNOSIS — I959 Hypotension, unspecified: Secondary | ICD-10-CM | POA: Diagnosis not present

## 2018-05-15 DIAGNOSIS — Z79899 Other long term (current) drug therapy: Secondary | ICD-10-CM | POA: Diagnosis not present

## 2018-05-15 DIAGNOSIS — J181 Lobar pneumonia, unspecified organism: Secondary | ICD-10-CM | POA: Diagnosis not present

## 2018-05-15 DIAGNOSIS — M6281 Muscle weakness (generalized): Secondary | ICD-10-CM | POA: Diagnosis not present

## 2018-05-15 DIAGNOSIS — M109 Gout, unspecified: Secondary | ICD-10-CM | POA: Diagnosis present

## 2018-05-15 DIAGNOSIS — G4733 Obstructive sleep apnea (adult) (pediatric): Secondary | ICD-10-CM | POA: Diagnosis present

## 2018-05-15 DIAGNOSIS — R413 Other amnesia: Secondary | ICD-10-CM | POA: Diagnosis not present

## 2018-05-15 DIAGNOSIS — J302 Other seasonal allergic rhinitis: Secondary | ICD-10-CM | POA: Diagnosis present

## 2018-05-15 DIAGNOSIS — I110 Hypertensive heart disease with heart failure: Secondary | ICD-10-CM | POA: Diagnosis present

## 2018-05-15 DIAGNOSIS — I255 Ischemic cardiomyopathy: Secondary | ICD-10-CM | POA: Diagnosis present

## 2018-05-15 DIAGNOSIS — Z87891 Personal history of nicotine dependence: Secondary | ICD-10-CM | POA: Diagnosis not present

## 2018-05-15 DIAGNOSIS — R2681 Unsteadiness on feet: Secondary | ICD-10-CM | POA: Diagnosis not present

## 2018-05-15 DIAGNOSIS — J189 Pneumonia, unspecified organism: Secondary | ICD-10-CM | POA: Diagnosis present

## 2018-05-15 DIAGNOSIS — Z955 Presence of coronary angioplasty implant and graft: Secondary | ICD-10-CM | POA: Diagnosis not present

## 2018-05-15 DIAGNOSIS — F0390 Unspecified dementia without behavioral disturbance: Secondary | ICD-10-CM | POA: Diagnosis present

## 2018-05-15 DIAGNOSIS — I252 Old myocardial infarction: Secondary | ICD-10-CM | POA: Diagnosis not present

## 2018-05-15 DIAGNOSIS — G8929 Other chronic pain: Secondary | ICD-10-CM | POA: Diagnosis present

## 2018-05-15 DIAGNOSIS — Z8674 Personal history of sudden cardiac arrest: Secondary | ICD-10-CM | POA: Diagnosis not present

## 2018-05-15 DIAGNOSIS — I2510 Atherosclerotic heart disease of native coronary artery without angina pectoris: Secondary | ICD-10-CM | POA: Diagnosis not present

## 2018-05-15 LAB — BASIC METABOLIC PANEL
Anion gap: 7 (ref 5–15)
BUN: 17 mg/dL (ref 8–23)
CO2: 24 mmol/L (ref 22–32)
Calcium: 8.6 mg/dL — ABNORMAL LOW (ref 8.9–10.3)
Chloride: 106 mmol/L (ref 98–111)
Creatinine, Ser: 0.95 mg/dL (ref 0.61–1.24)
GFR calc Af Amer: >60 mL/min (ref >60)
GFR calc non Af Amer: >60 mL/min (ref >60)
Glucose, Bld: 93 mg/dL (ref 70–99)
Potassium: 3.7 mmol/L (ref 3.5–5.1)
Sodium: 137 mmol/L (ref 135–145)

## 2018-05-15 LAB — CBC WITH DIFFERENTIAL/PLATELET
Abs Immature Granulocytes: 0.03 10*3/uL (ref 0.00–0.07)
Basophils Absolute: 0 10*3/uL (ref 0.0–0.1)
Basophils Relative: 1 %
Eosinophils Absolute: 0.3 10*3/uL (ref 0.0–0.5)
Eosinophils Relative: 5 %
HCT: 36.9 % — ABNORMAL LOW (ref 39.0–52.0)
Hemoglobin: 12.6 g/dL — ABNORMAL LOW (ref 13.0–17.0)
Immature Granulocytes: 1 %
Lymphocytes Relative: 35 %
Lymphs Abs: 2.3 10*3/uL (ref 0.7–4.0)
MCH: 30.6 pg (ref 26.0–34.0)
MCHC: 34.1 g/dL (ref 30.0–36.0)
MCV: 89.6 fL (ref 80.0–100.0)
Monocytes Absolute: 0.7 10*3/uL (ref 0.1–1.0)
Monocytes Relative: 10 %
Neutro Abs: 3.1 10*3/uL (ref 1.7–7.7)
Neutrophils Relative %: 48 %
Platelets: 199 10*3/uL (ref 150–400)
RBC: 4.12 MIL/uL — ABNORMAL LOW (ref 4.22–5.81)
RDW: 12.4 % (ref 11.5–15.5)
WBC: 6.4 10*3/uL (ref 4.0–10.5)
nRBC: 0 % (ref 0.0–0.2)

## 2018-05-15 LAB — LEGIONELLA PNEUMOPHILA SEROGP 1 UR AG: L. pneumophila Serogp 1 Ur Ag: NEGATIVE

## 2018-05-15 NOTE — NC FL2 (Signed)
[place] LEVEL OF CARE SCREENING TOOL     IDENTIFICATION  Patient Name: Benjamin George Birthdate: 1941-01-27 Sex: male Admission Date (Current Location): 05/12/2018  [facility] and [place] Number:  Herbalist and Address:  [facility],  [place], [place]      Provider Number: [id number]  Attending Physician Name and Address:  Eugenie Filler, MD  Relative Name and Phone Number:  Thanh Mottern, [phone]    Current Level of Care: Hospital Recommended Level of Care: [place] Prior Approval Number:    Date Approved/Denied:   PASRR Number: [id number]  Discharge Plan: SNF    Current Diagnoses: Patient Active Problem List   Diagnosis Date Noted  . Hypotension   . CAP (community acquired pneumonia) 05/13/2018  . Community acquired pneumonia of left lower lobe of lung ([place]) 05/12/2018  . Ischemic cardiomyopathy 04/18/2017  . Memory disorder 12/21/2015  . Spinal stenosis 05/06/2013  . Palpitations 03/02/2013  . Morbid obesity ([place]) 12/12/2011  . Arthritis of shoulder region, 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  . CAD (coronary artery disease) 10/24/2010  . Preoperative evaluation to rule out surgical contraindication 10/24/2010  . ALLERGIC RHINITIS 05/17/2010  . INSOMNIA 05/17/2010  . Hyperlipidemia LDL goal <70 04/13/2009  . GOUT 04/13/2009  . Obstructive sleep apnea 04/13/2009  . Essential hypertension 04/13/2009  . HEART ATTACK 04/13/2009  . CARDIAC ARREST 04/13/2009    Orientation RESPIRATION BLADDER Height & Weight     Self, Situation, Place  Normal Continent Weight: 234 lb 5.6 oz (106.3 kg) Height:  6' (182.9 cm)  BEHAVIORAL SYMPTOMS/MOOD NEUROLOGICAL BOWEL NUTRITION STATUS      Continent Diet(heart room)  AMBULATORY STATUS COMMUNICATION OF NEEDS Skin   Limited Assist Verbally                         Personal Care Assistance Level of Assistance  Bathing, Feeding, Dressing Bathing Assistance:  Limited assistance Feeding assistance: Independent Dressing Assistance: Limited assistance     Functional Limitations Info  Sight, Hearing, Speech Sight Info: Impaired Hearing Info: Adequate Speech Info: Adequate    SPECIAL CARE FACTORS FREQUENCY  PT (By licensed PT), OT (By licensed OT)     PT Frequency: 5x wk OT Frequency: 5x wk            Contractures Contractures Info: Not present    Additional Factors Info  Code Status, Allergies Code Status Info: Full Code Allergies Info: CODEINE, HYDROCODONE, ARICEPT DONEPEZIL HCL, OXYCONTIN OXYCODONE HCL, METHOCARBAMOL, OTHER           Current Medications (05/15/2018):  This is the current hospital active medication list Current Facility-Administered Medications  Medication Dose Route Frequency Provider Last Rate Last Dose  . 0.9 %  sodium chloride infusion   Intravenous Continuous Eugenie Filler, MD 75 mL/hr at 05/15/18 0930    . acetaminophen (TYLENOL) tablet 650 mg  650 mg Oral Q6H PRN Rise Patience, MD       Or  . acetaminophen (TYLENOL) suppository 650 mg  650 mg Rectal Q6H PRN Rise Patience, MD      . allopurinol (ZYLOPRIM) tablet 100 mg  100 mg Oral q morning - 10a Rise Patience, MD   100 mg at 05/15/18 1134  . aspirin EC tablet 81 mg  81 mg Oral Daily Rise Patience, MD   81 mg at 05/15/18 1134  . atenolol (TENORMIN) tablet 50 mg  50 mg  Oral Daily Rise Patience, MD   50 mg at 05/15/18 1134  . atorvastatin (LIPITOR) tablet 20 mg  20 mg Oral q1800 Rise Patience, MD   20 mg at 05/14/18 1843  . azithromycin (ZITHROMAX) tablet 500 mg  500 mg Oral Daily Adrian Saran, RPH   500 mg at 05/15/18 1135  . cefTRIAXone (ROCEPHIN) 1 g in sodium chloride 0.9 % 100 mL IVPB  1 g Intravenous Q24H Rise Patience, MD   Stopped at 05/14/18 2104  . enoxaparin (LOVENOX) injection 40 mg  40 mg Subcutaneous Daily Rise Patience, MD   40 mg at 05/15/18 1137  . guaiFENesin (MUCINEX) 12 hr  tablet 1,200 mg  1,200 mg Oral BID Eugenie Filler, MD   1,200 mg at 05/15/18 1135  . linaclotide (LINZESS) capsule 290 mcg  290 mcg Oral QAC breakfast Rise Patience, MD   290 mcg at 05/14/18 1414  . meclizine (ANTIVERT) tablet 25 mg  25 mg Oral BID PRN Rise Patience, MD      . memantine [facility]) tablet 10 mg  10 mg Oral BID Rise Patience, MD   10 mg at 05/15/18 1135  . ondansetron (ZOFRAN) tablet 4 mg  4 mg Oral Q6H PRN Rise Patience, MD       Or  . ondansetron [facility]) injection 4 mg  4 mg Intravenous Q6H PRN Rise Patience, MD      . traMADol [facility]) tablet 50 mg  50 mg Oral Q6H PRN Rise Patience, MD   50 mg at 05/13/18 2132  . vitamin B-12 (CYANOCOBALAMIN) tablet 200 mcg  200 mcg Oral q morning - 10a Rise Patience, MD   200 mcg at 05/15/18 1136     Discharge Medications: Please see discharge summary for a list of discharge medications.  Relevant Imaging Results:  Relevant Lab Results:   Additional Information [id number]  Wende Neighbors, LCSW

## 2018-05-15 NOTE — Plan of Care (Signed)
  Problem: Education: Goal: Knowledge of General Education information will improve Description Including pain rating scale, medication(s)/side effects and non-pharmacologic comfort measures 05/15/2018 2034 by Meriel Pica, RN Outcome: Progressing 05/15/2018 2033 by Meriel Pica, RN Outcome: Progressing   Problem: Health Behavior/Discharge Planning: Goal: Ability to manage health-related needs will improve 05/15/2018 2034 by Meriel Pica, RN Outcome: Progressing 05/15/2018 2033 by Meriel Pica, RN Outcome: Progressing   Problem: Clinical Measurements: Goal: Ability to maintain clinical measurements within normal limits will improve 05/15/2018 2034 by Meriel Pica, RN Outcome: Progressing 05/15/2018 2033 by Meriel Pica, RN Outcome: Progressing Goal: Will remain free from infection 05/15/2018 2034 by Meriel Pica, RN Outcome: Progressing 05/15/2018 2033 by Meriel Pica, RN Outcome: Progressing Goal: Diagnostic test results will improve 05/15/2018 2034 by Meriel Pica, RN Outcome: Progressing 05/15/2018 2033 by Meriel Pica, RN Outcome: Progressing Goal: Respiratory complications will improve 05/15/2018 2034 by Meriel Pica, RN Outcome: Progressing 05/15/2018 2033 by Meriel Pica, RN Outcome: Progressing Goal: Cardiovascular complication will be avoided 05/15/2018 2034 by Meriel Pica, RN Outcome: Progressing 05/15/2018 2033 by Meriel Pica, RN Outcome: Progressing   Problem: Coping: Goal: Level of anxiety will decrease 05/15/2018 2034 by Meriel Pica, RN Outcome: Progressing 05/15/2018 2033 by Meriel Pica, RN Outcome: Progressing   Problem: Pain Managment: Goal: General experience of comfort will improve 05/15/2018 2034 by Meriel Pica, RN Outcome: Progressing 05/15/2018 2033 by Meriel Pica, RN Outcome: Progressing   Problem: Safety: Goal: Ability to remain free from injury will improve Outcome: Progressing   Problem:  Skin Integrity: Goal: Risk for impaired skin integrity will decrease Outcome: Progressing

## 2018-05-15 NOTE — Progress Notes (Signed)
PROGRESS NOTE    Benjamin George  WRU:[id number] DOB: 26-Apr-1940 DOA: 05/12/2018 PCP: [name], MD    Brief Narrative:  Benjamin George a 78 y.o.malewithhistory of dementia, CAD status post remote stenting, cardiomyopathy with improvement in EF last 2D echo showed EF of 55 to 60% with grade 1 diastolic dysfunction in 9147 was brought to the ER with generalized weakness, urinary symptoms.   ED Course:In the ER patient was confused, had one episode of nausea vomiting. CT lumbar spine was negative for anything acute chest x-ray shows possibility of pneumonia. UA is unremarkable.  Patient admitted placed empirically on IV antibiotics.  On 05/13/2018 patient noted to be hypotensive with systolic blood pressures in the 90s.   Assessment & Plan:   Principal Problem:   Community acquired pneumonia of left lower lobe of lung ([place]) Active Problems:   Obstructive sleep apnea   Essential hypertension   CAD (coronary artery disease)   Memory disorder   Ischemic cardiomyopathy   CAP (community acquired pneumonia)   Hypotension  1 community-acquired pneumonia Patient had presented with significant generalized weakness, fever, chest x-ray concerning for pneumonia.  Influenza PCR negative.  Urine strep pneumococcus antigen negative.  Fever curve trended down.  Patient improving clinically slowly.  Continue IV Rocephin and oral azithromycin.  Continue Mucinex.  Supportive care.  Could likely transition to oral antibiotics tomorrow.  Follow.  2.  Hypotension Patient noted to be hypotensive on 05/13/2018 in the afternoon with systolic blood pressures in the 90s.  Blood pressure noted to be systolic blood pressure of 90s the afternoon of 05/14/2018.  Patient's Imdur, HCTZ, Cozaar were discontinued.  Patient placed on gentle hydration.  Blood pressure improving.  Follow.  3.  Dementia Continue Namenda.  Continue treatment for community-acquired pneumonia.  Follow.  4.   Hypertension/hypotension Patient noted to be hypotensive the afternoon of 05/13/2018 as well as the afternoon of 05/14/2018.  Antihypertensive medications have been discontinued.  Patient placed on gentle IV hydration.  Blood pressure improving.   5.  Weakness Patient with significant generalized weakness.  Patient seen by physical therapy who are recommending skilled nursing facility as patient seems unsafe to be discharged home with home health at this time and patient not at his baseline physically.  PT/OT.  6.  Obstructive sleep apnea CPAP nightly.  7.  Coronary artery disease status post stenting/cardiomyopathy Per last 2D echo EF 55 to 60% with grade 1 diastolic dysfunction.  Patient denies any chest pain.  Patient euvolemic on examination.  Due to patient's hypotension will discontinued Imdur, Cozaar, HCTZ.  Continue Tylenol for now.  Follow.   DVT prophylaxis: Lovenox Code Status: Full Family Communication: Updated patient and wife at bedside. Disposition Plan: Skilled nursing facility once medically stable, resolution of hypotension and clinical improvement hopefully in 24-48 hours.   Consultants:   None  Procedures:   CT L-spine 05/12/2018  Chest x-ray 05/12/2018  Antimicrobials:   IV azithromycin 05/12/2018>>>> 05/14/2018  IV Rocephin 05/12/2018  Oral azithromycin 05/14/2018   Subjective: Patient sitting up in chair. Patient states not feeling well and weak. Confusion improving.  SOB improving.   Objective: Vitals:   05/14/18 1342 05/14/18 2159 05/15/18 0448 05/15/18 0450  BP: (!) 94/51 138/66  (!) 159/80  Pulse: 63 (!) 57  (!) 56  Resp: 17 18  18   Temp: 98.1 F (36.7 C) 97.8 F (36.6 C)  97.6 F (36.4 C)  TempSrc: Oral Oral  Oral  SpO2: 97% 94%  90%  Weight:  106.3 kg   Height:        Intake/Output Summary (Last 24 hours) at 05/15/2018 1238 Last data filed at 05/15/2018 0600 Gross per 24 hour  Intake 1250.48 ml  Output -  Net 1250.48 ml   Filed Weights    05/12/18 1450 05/13/18 0035 05/15/18 0448  Weight: 110.3 kg 107.1 kg 106.3 kg    Examination:  General exam: NAD Respiratory system: left basilar coarse BS.  No wheezes, no crackles.  Respiratory effort normal. Cardiovascular system: Regular rate and rhythm no murmurs rubs or gallops.  No JVD.  No lower extremity edema.   Gastrointestinal system: Abdomen is soft, nontender, nondistended, positive bowel sounds.  No rebound.  No guarding. Central nervous system: Alert and oriented. No focal neurological deficits. Extremities: Symmetric 5 x 5 power. Skin: No rashes, lesions or ulcers Psychiatry: Judgement and insight appear poor to fair. Mood & affect appropriate.     Data Reviewed: I have personally reviewed following labs and imaging studies  CBC: Recent Labs  Lab 05/12/18 1557 05/13/18 0057 05/14/18 0552 05/15/18 0535  WBC 10.0 8.4 7.5 6.4  NEUTROABS 7.7  --   --  3.1  HGB 12.9* 12.9* 12.4* 12.6*  HCT 38.8* 39.0 37.5* 36.9*  MCV 94.4 93.5 90.8 89.6  PLT 187 159 184 585   Basic Metabolic Panel: Recent Labs  Lab 05/12/18 1557 05/13/18 0057 05/14/18 0552 05/15/18 0535  NA 133* 135 135 137  K 5.5* 4.0 3.7 3.7  CL 101 103 103 106  CO2 26 23 22 24   GLUCOSE 96 141* 98 93  BUN 21 17 18 17   CREATININE 1.25* 1.00 1.04 0.95  CALCIUM 8.1* 8.2* 8.3* 8.6*   GFR: Estimated Creatinine Clearance: 80.8 mL/min (by C-G formula based on SCr of 0.95 mg/dL). Liver Function Tests: Recent Labs  Lab 05/12/18 1557  AST 25  ALT 11  ALKPHOS 59  BILITOT 1.3*  PROT 5.8*  ALBUMIN 3.3*   No results for input(s): LIPASE, AMYLASE in the last 168 hours. No results for input(s): AMMONIA in the last 168 hours. Coagulation Profile: No results for input(s): INR, PROTIME in the last 168 hours. Cardiac Enzymes: Recent Labs  Lab 05/13/18 0057 05/13/18 0640 05/13/18 1204  TROPONINI <0.03 <0.03 <0.03   BNP (last 3 results) No results for input(s): PROBNP in the last 8760  hours. HbA1C: No results for input(s): HGBA1C in the last 72 hours. CBG: No results for input(s): GLUCAP in the last 168 hours. Lipid Profile: No results for input(s): CHOL, HDL, LDLCALC, TRIG, CHOLHDL, LDLDIRECT in the last 72 hours. Thyroid Function Tests: Recent Labs    05/13/18 0057  TSH 0.853   Anemia Panel: No results for input(s): VITAMINB12, FOLATE, FERRITIN, TIBC, IRON, RETICCTPCT in the last 72 hours. Sepsis Labs: No results for input(s): PROCALCITON, LATICACIDVEN in the last 168 hours.  Recent Results (from the past 240 hour(s))  Urine culture     Status: Abnormal   Collection Time: 05/12/18  5:03 PM  Result Value Ref Range Status   Specimen Description   Final    URINE, RANDOM Performed at [facility] [address] [place], [place] [address]    Special Requests   Final    NONE Performed at [facility], [place] [address], [place], [place] [address]    Culture (A)  Final    <10,000 COLONIES/mL INSIGNIFICANT GROWTH Performed at [facility] [address] [place],  [address]    Report  Status 05/14/2018 FINAL  Final         Radiology Studies: No results found.      Scheduled Meds: . allopurinol  100 mg Oral q morning - 10a  . aspirin EC  81 mg Oral Daily  . atenolol  50 mg Oral Daily  . atorvastatin  20 mg Oral q1800  . azithromycin  500 mg Oral Daily  . enoxaparin (LOVENOX) injection  40 mg Subcutaneous Daily  . guaiFENesin  1,200 mg Oral BID  . linaclotide  290 mcg Oral QAC breakfast  . memantine  10 mg Oral BID  . vitamin B-12  200 mcg Oral q morning - 10a   Continuous Infusions: . sodium chloride 75 mL/hr at 05/15/18 0930  . cefTRIAXone (ROCEPHIN)  IV Stopped (05/14/18 2104)     LOS: 0 days    Time spent: 35 minutes    Irine Seal, MD [facility]  If 7PM-7AM, please contact night-coverage [email or web address] 05/15/2018, 12:38 PM

## 2018-05-15 NOTE — Clinical Social Work Note (Signed)
Clinical Social Work Assessment  Patient Details  Name: Benjamin George MRN: [id number] Date of Birth: 12-20-1940  Date of referral:  05/15/18               Reason for consult:  Discharge Planning, Facility Placement                Permission sought to share information with:  Family Supports Permission granted to share information::  Yes, Verbal Permission Granted  Name::     Benjamin George  Agency::     Relationship::  spouse  Contact Information:  [phone]  Housing/Transportation Living arrangements for the past 2 months:  [place] of Information:  Patient, Spouse Patient Interpreter Needed:  None Criminal Activity/Legal Involvement Pertinent to Current Situation/Hospitalization:    Significant Relationships:  Adult Children, Spouse Lives with:  Spouse Do you feel safe going back to the place where you live?  Yes Need for family participation in patient care:  Yes (Comment)  Care giving concerns:  Patient's wife at bedside. Patient came from home with wife   Social Worker assessment / plan:  CSW met patient and wife at bedside to discuss discharge plan. Patients wife Benjamin Grace stated she is agreeable for patient to go to rehab. CSW gave family a list of SNF that has bed available.  Patients spouse stated to [place] that she would like patient to discharge to [facility]. CSW requested authorization through insurance be started by facility.  Employment status:  Retired Nurse, adult PT Recommendations:  [place] / Referral to community resources:  [place]  Patient/Family's Response to care:  Family appreciates CSW role in care  Patient/Family's Understanding of and Emotional Response to Diagnosis, Current Treatment, and Prognosis:  Patient to discharge to SNF when stable   Emotional Assessment Appearance:  Appears stated age Attitude/Demeanor/Rapport:  Engaged Affect (typically  observed):  Accepting Orientation:  Oriented to  Time, Oriented to Self, Oriented to Place, Oriented to Situation Alcohol / Substance use:  Not Applicable Psych involvement (Current and /or in the community):  No (Comment)  Discharge Needs  Concerns to be addressed:  Care Coordination Readmission within the last 30 days:  No Current discharge risk:  Dependent with Mobility Barriers to Discharge:  Continued Medical Work up, [place], LCSW 05/15/2018, 12:56 PM

## 2018-05-16 LAB — BASIC METABOLIC PANEL
Anion gap: 6 (ref 5–15)
BUN: 17 mg/dL (ref 8–23)
CO2: 24 mmol/L (ref 22–32)
Calcium: 8.3 mg/dL — ABNORMAL LOW (ref 8.9–10.3)
Chloride: 107 mmol/L (ref 98–111)
Creatinine, Ser: 0.95 mg/dL (ref 0.61–1.24)
GFR calc Af Amer: >60 mL/min (ref >60)
GFR calc non Af Amer: >60 mL/min (ref >60)
Glucose, Bld: 90 mg/dL (ref 70–99)
Potassium: 3.6 mmol/L (ref 3.5–5.1)
Sodium: 137 mmol/L (ref 135–145)

## 2018-05-16 LAB — CBC
HCT: 39.3 % (ref 39.0–52.0)
Hemoglobin: 13.2 g/dL (ref 13.0–17.0)
MCH: 31.1 pg (ref 26.0–34.0)
MCHC: 33.6 g/dL (ref 30.0–36.0)
MCV: 92.7 fL (ref 80.0–100.0)
Platelets: 196 10*3/uL (ref 150–400)
RBC: 4.24 MIL/uL (ref 4.22–5.81)
RDW: 12.5 % (ref 11.5–15.5)
WBC: 8.3 10*3/uL (ref 4.0–10.5)
nRBC: 0 % (ref 0.0–0.2)

## 2018-05-16 MED ORDER — LOSARTAN POTASSIUM 50 MG PO TABS
50.0000 mg | ORAL_TABLET | Freq: Every day | ORAL | Status: DC
  Administered 2018-05-16: 50 mg via ORAL
  Filled 2018-05-16: qty 1

## 2018-05-16 MED ORDER — CEFDINIR 300 MG PO CAPS
300.0000 mg | ORAL_CAPSULE | Freq: Two times a day (BID) | ORAL | Status: DC
  Administered 2018-05-16 – 2018-05-17 (×2): 300 mg via ORAL
  Filled 2018-05-16 (×2): qty 1

## 2018-05-16 MED ORDER — LOSARTAN POTASSIUM 25 MG PO TABS: 25.0000 mg | ORAL_TABLET | Freq: Every day | ORAL | Status: DC

## 2018-05-16 NOTE — Progress Notes (Signed)
PROGRESS NOTE    Benjamin George  IRJ:[id number] DOB: 1941-01-18 DOA: 05/12/2018 PCP: [name], MD    Brief Narrative:  Benjamin Dimartino Rosettais a 78 y.o.malewithhistory of dementia, CAD status post remote stenting, cardiomyopathy with improvement in EF last 2D echo showed EF of 55 to 60% with grade 1 diastolic dysfunction in 3016 was brought to the ER with generalized weakness, urinary symptoms.   ED Course:In the ER patient was confused, had one episode of nausea vomiting. CT lumbar spine was negative for anything acute chest x-ray shows possibility of pneumonia. UA is unremarkable.  Patient admitted placed empirically on IV antibiotics.  On 05/13/2018 patient noted to be hypotensive with systolic blood pressures in the 90s.   Assessment & Plan:   Principal Problem:   Community acquired pneumonia of left lower lobe of lung ([place]) Active Problems:   Obstructive sleep apnea   Essential hypertension   CAD (coronary artery disease)   Memory disorder   Ischemic cardiomyopathy   CAP (community acquired pneumonia)   Hypotension  1 community-acquired pneumonia Patient had presented with significant generalized weakness, fever, chest x-ray concerning for pneumonia.  Influenza PCR negative.  Urine strep pneumococcus antigen negative.  Fever curve trended down.  Patient improving clinically slowly.  Continue oral azithromycin.  Discontinue IV Rocephin and placed on oral Vantin.  Continue Mucinex.  Supportive care.   2.  Hypotension Patient noted to be hypotensive on 05/13/2018 in the afternoon with systolic blood pressures in the 90s.  Blood pressure noted to be systolic blood pressure of 90s the afternoon of 05/14/2018.  Patient's Imdur, HCTZ, Cozaar were discontinued.  Patient placed on gentle hydration.  Blood pressure improving.  Follow.  3.  Dementia Continue Namenda.  Continue treatment for community-acquired pneumonia.  Follow.  4.  Hypertension/hypotension Patient noted to be  hypotensive the afternoon of 05/13/2018 as well as the afternoon of 05/14/2018.  Antihypertensive medications have been discontinued.  Patient placed on gentle IV hydration.  Blood pressure improved.   5.  Weakness Patient with significant generalized weakness.  Patient seen by physical therapy who are recommending skilled nursing facility as patient seems unsafe to be discharged home with home health at this time and is a 2 person assist.  Patient not at his baseline physically.  Patient noted to be driving prior to admission.  PT/OT.  6.  Obstructive sleep apnea CPAP nightly.  7.  Coronary artery disease status post stenting/cardiomyopathy Per last 2D echo EF 55 to 60% with grade 1 diastolic dysfunction.  Patient denies any chest pain.  Patient euvolemic on examination.  Due to patient's hypotension discontinued Imdur, Cozaar, HCTZ.  Cozaar was resumed yesterday however will discontinue it as blood pressure is somewhat borderline.  Continue atenolol.  Follow.   DVT prophylaxis: Lovenox Code Status: Full Family Communication: Updated patient.  No family at bedside. Disposition Plan: Skilled nursing facility when bed available.   Consultants:   None  Procedures:   CT L-spine 05/12/2018  Chest x-ray 05/12/2018  Antimicrobials:   IV azithromycin 05/12/2018>>>> 05/14/2018  IV Rocephin 05/12/2018>>>> 05/16/2018  Oral azithromycin 05/14/2018  Oral Vantin 05/16/2018   Subjective: Patient sitting up in chair.  States shortness of breath improving.  Denies any chest pain.  Still with generalized weakness however slowly improving.   Objective: Vitals:   05/15/18 0450 05/15/18 1459 05/15/18 2155 05/16/18 0506  BP: (!) 159/80 140/60 (!) 172/66 (!) 148/51  Pulse: (!) 56 62 (!) 58 (!) 54  Resp: 18 18 16  16  Temp: 97.6 F (36.4 C) 98 F (36.7 C) 98.3 F (36.8 C) 98.5 F (36.9 C)  TempSrc: Oral Oral Oral Oral  SpO2: 90% 96% 95% 96%  Weight:      Height:        Intake/Output Summary (Last 24  hours) at 05/16/2018 1241 Last data filed at 05/16/2018 0200 Gross per 24 hour  Intake 1694.66 ml  Output -  Net 1694.66 ml   Filed Weights   05/12/18 1450 05/13/18 0035 05/15/18 0448  Weight: 110.3 kg 107.1 kg 106.3 kg    Examination:  General exam: NAD Respiratory system: Decreasing left basilar coarse breath sounds.  No wheezes, no crackles.  Speaking in full sentences.  Normal respiratory effort.  Cardiovascular system: RRR no murmurs rubs or gallops.  No JVD.  No lower extremity edema.   Gastrointestinal system: Abdomen is nontender, nondistended, soft, positive bowel sounds.  No rebound.  No guarding.  Central nervous system: Alert and oriented. No focal neurological deficits. Extremities: Symmetric 5 x 5 power. Skin: No rashes, lesions or ulcers Psychiatry: Judgement and insight appear fair. Mood & affect appropriate.     Data Reviewed: I have personally reviewed following labs and imaging studies  CBC: Recent Labs  Lab 05/12/18 1557 05/13/18 0057 05/14/18 0552 05/15/18 0535 05/16/18 0554  WBC 10.0 8.4 7.5 6.4 8.3  NEUTROABS 7.7  --   --  3.1  --   HGB 12.9* 12.9* 12.4* 12.6* 13.2  HCT 38.8* 39.0 37.5* 36.9* 39.3  MCV 94.4 93.5 90.8 89.6 92.7  PLT 187 159 184 199 812   Basic Metabolic Panel: Recent Labs  Lab 05/12/18 1557 05/13/18 0057 05/14/18 0552 05/15/18 0535 05/16/18 0554  NA 133* 135 135 137 137  K 5.5* 4.0 3.7 3.7 3.6  CL 101 103 103 106 107  CO2 26 23 22 24 24   GLUCOSE 96 141* 98 93 90  BUN 21 17 18 17 17   CREATININE 1.25* 1.00 1.04 0.95 0.95  CALCIUM 8.1* 8.2* 8.3* 8.6* 8.3*   GFR: Estimated Creatinine Clearance: 80.8 mL/min (by C-G formula based on SCr of 0.95 mg/dL). Liver Function Tests: Recent Labs  Lab 05/12/18 1557  AST 25  ALT 11  ALKPHOS 59  BILITOT 1.3*  PROT 5.8*  ALBUMIN 3.3*   No results for input(s): LIPASE, AMYLASE in the last 168 hours. No results for input(s): AMMONIA in the last 168 hours. Coagulation Profile: No  results for input(s): INR, PROTIME in the last 168 hours. Cardiac Enzymes: Recent Labs  Lab 05/13/18 0057 05/13/18 0640 05/13/18 1204  TROPONINI <0.03 <0.03 <0.03   BNP (last 3 results) No results for input(s): PROBNP in the last 8760 hours. HbA1C: No results for input(s): HGBA1C in the last 72 hours. CBG: No results for input(s): GLUCAP in the last 168 hours. Lipid Profile: No results for input(s): CHOL, HDL, LDLCALC, TRIG, CHOLHDL, LDLDIRECT in the last 72 hours. Thyroid Function Tests: No results for input(s): TSH, T4TOTAL, FREET4, T3FREE, THYROIDAB in the last 72 hours. Anemia Panel: No results for input(s): VITAMINB12, FOLATE, FERRITIN, TIBC, IRON, RETICCTPCT in the last 72 hours. Sepsis Labs: No results for input(s): PROCALCITON, LATICACIDVEN in the last 168 hours.  Recent Results (from the past 240 hour(s))  Urine culture     Status: Abnormal   Collection Time: 05/12/18  5:03 PM  Result Value Ref Range Status   Specimen Description   Final    URINE, RANDOM Performed at [facility] [address] [place],  [place] [address]    Special Requests   Final    NONE Performed at [facility], [place] [address] [place], [place] [address]    Culture (A)  Final    <10,000 COLONIES/mL INSIGNIFICANT GROWTH Performed at [facility] [address] [place], [place] [address]    Report Status 05/14/2018 FINAL  Final         Radiology Studies: No results found.      Scheduled Meds: . allopurinol  100 mg Oral q morning - 10a  . aspirin EC  81 mg Oral Daily  . atenolol  50 mg Oral Daily  . atorvastatin  20 mg Oral q1800  . azithromycin  500 mg Oral Daily  . enoxaparin (LOVENOX) injection  40 mg Subcutaneous Daily  . guaiFENesin  1,200 mg Oral BID  . linaclotide  290 mcg Oral QAC breakfast  . losartan  50 mg Oral Daily  . memantine  10 mg Oral BID  . vitamin B-12  200 mcg Oral q morning - 10a   Continuous  Infusions: . cefTRIAXone (ROCEPHIN)  IV Stopped (05/15/18 1935)     LOS: 1 day    Time spent: 35 minutes    Irine Seal, MD [facility]  If 7PM-7AM, please contact night-coverage [email or web address] 05/16/2018, 12:41 PM

## 2018-05-16 NOTE — Progress Notes (Signed)
Clinical Social Worker  Following patient for support and discharge needs. Patient has bed at [facility] but is waiting for authorization through insurance. CSW will continue to follow patient for discharge needs.   Rhea Pink, MSW,  [facility]

## 2018-05-16 NOTE — Progress Notes (Signed)
Physical Therapy Treatment Patient Details Name: Benjamin George MRN: [id number] DOB: 12/08/1940 Today's Date: 05/16/2018    History of Present Illness 78 y.o. male with history of dementia, CAD status post remote stenting, cardiomyopathy , back surgeries, spine stimulator,was brought to the ER the patient was feeling weak, difficulty walking and confused. Dx: Pna    PT Comments    Progressing with mobility.    Follow Up Recommendations  SNF     Equipment Recommendations       Recommendations for Other Services       Precautions / Restrictions Precautions Precautions: Fall Restrictions Weight Bearing Restrictions: No    Mobility  Bed Mobility Overal bed mobility: Needs Assistance Bed Mobility: Sit to Supine       Sit to supine: Min guard   General bed mobility comments: close guard for safety.   Transfers Overall transfer level: Needs assistance Equipment used: Rolling walker (2 wheeled) Transfers: Sit to/from Stand Sit to Stand: Min assist         General transfer comment: Assist to rise, stabilize, control descent. VCs safety, hand placement. Pt attempts to park walker before sitting  Ambulation/Gait Ambulation/Gait assistance: Min assist Gait Distance (Feet): 150 Feet Assistive device: Rolling walker (2 wheeled) Gait Pattern/deviations: Step-through pattern;Decreased stride length     General Gait Details: Intermittent assist to steady. Decreased step length noted. Pt tolerated distance well. He denied dizziness.    Stairs             Wheelchair Mobility    Modified Rankin (Stroke Patients Only)       Balance Overall balance assessment: Needs assistance         Standing balance support: Bilateral upper extremity supported Standing balance-Leahy Scale: Poor                              Cognition Arousal/Alertness: Awake/alert Behavior During Therapy: WFL for tasks assessed/performed                             Safety/Judgement: Decreased awareness of safety            Exercises      General Comments        Pertinent Vitals/Pain Pain Assessment: Faces Faces Pain Scale: Hurts even more Pain Location: back Pain Descriptors / Indicators: Sore;Aching;Discomfort Pain Intervention(s): Monitored during session;Repositioned    Home Living                      Prior Function            PT Goals (current goals can now be found in the care plan section) Progress towards PT goals: Progressing toward goals    Frequency    Min 2X/week      PT Plan Frequency needs to be updated    Co-evaluation              AM-PAC PT "6 Clicks" Mobility   Outcome Measure  Help needed turning from your back to your side while in a flat bed without using bedrails?: A Little Help needed moving from lying on your back to sitting on the side of a flat bed without using bedrails?: A Little Help needed moving to and from a bed to a chair (including a wheelchair)?: A Little Help needed standing up from a chair using your arms (e.g., wheelchair or bedside chair)?:  A Little Help needed to walk in hospital room?: A Little Help needed climbing 3-5 steps with a railing? : A Little 6 Click Score: 18    End of Session Equipment Utilized During Treatment: Gait belt Activity Tolerance: Patient tolerated treatment well Patient left: in bed;with call bell/phone within reach;with bed alarm set   PT Visit Diagnosis: Unsteadiness on feet (R26.81)     Time: 3220-2542 PT Time Calculation (min) (ACUTE ONLY): 20 min  Charges:  $Gait Training: 8-22 mins                        Benjamin George, Benjamin George Pager: [phone] Office: [phone]  '

## 2018-05-17 DIAGNOSIS — R531 Weakness: Secondary | ICD-10-CM | POA: Diagnosis not present

## 2018-05-17 DIAGNOSIS — J188 Other pneumonia, unspecified organism: Secondary | ICD-10-CM | POA: Diagnosis not present

## 2018-05-17 DIAGNOSIS — R2689 Other abnormalities of gait and mobility: Secondary | ICD-10-CM | POA: Diagnosis not present

## 2018-05-17 DIAGNOSIS — R0989 Other specified symptoms and signs involving the circulatory and respiratory systems: Secondary | ICD-10-CM | POA: Diagnosis not present

## 2018-05-17 DIAGNOSIS — I428 Other cardiomyopathies: Secondary | ICD-10-CM | POA: Diagnosis not present

## 2018-05-17 DIAGNOSIS — G4733 Obstructive sleep apnea (adult) (pediatric): Secondary | ICD-10-CM | POA: Diagnosis not present

## 2018-05-17 DIAGNOSIS — I10 Essential (primary) hypertension: Secondary | ICD-10-CM | POA: Diagnosis not present

## 2018-05-17 DIAGNOSIS — I959 Hypotension, unspecified: Secondary | ICD-10-CM | POA: Diagnosis not present

## 2018-05-17 DIAGNOSIS — I2510 Atherosclerotic heart disease of native coronary artery without angina pectoris: Secondary | ICD-10-CM | POA: Diagnosis not present

## 2018-05-17 DIAGNOSIS — R2681 Unsteadiness on feet: Secondary | ICD-10-CM | POA: Diagnosis not present

## 2018-05-17 DIAGNOSIS — M6281 Muscle weakness (generalized): Secondary | ICD-10-CM | POA: Diagnosis not present

## 2018-05-17 DIAGNOSIS — J181 Lobar pneumonia, unspecified organism: Secondary | ICD-10-CM | POA: Diagnosis not present

## 2018-05-17 DIAGNOSIS — R413 Other amnesia: Secondary | ICD-10-CM | POA: Diagnosis not present

## 2018-05-17 MED ORDER — CEFDINIR 300 MG PO CAPS: 300.0000 mg | ORAL_CAPSULE | Freq: Two times a day (BID) | ORAL | 0 refills | Status: AC

## 2018-05-17 MED ORDER — POLYETHYLENE GLYCOL 3350 17 G PO PACK
17.0000 g | PACK | Freq: Two times a day (BID) | ORAL | Status: DC
  Administered 2018-05-17: 17 g via ORAL
  Filled 2018-05-17: qty 1

## 2018-05-17 MED ORDER — ISOSORBIDE MONONITRATE ER 30 MG PO TB24: 30.0000 mg | ORAL_TABLET | Freq: Every day | ORAL | 0 refills | Status: DC

## 2018-05-17 MED ORDER — TRAMADOL HCL 50 MG PO TABS: 50.0000 mg | ORAL_TABLET | Freq: Four times a day (QID) | ORAL | 0 refills | Status: DC | PRN

## 2018-05-17 MED ORDER — AZITHROMYCIN 500 MG PO TABS: 500.0000 mg | ORAL_TABLET | Freq: Every day | ORAL | 0 refills | Status: AC

## 2018-05-17 MED ORDER — SORBITOL 70 % SOLN
30.0000 mL | Freq: Once | Status: AC
  Administered 2018-05-17: 30 mL via ORAL
  Filled 2018-05-17: qty 30

## 2018-05-17 MED ORDER — LOSARTAN POTASSIUM-HCTZ 100-12.5 MG PO TABS: 1.0000 | ORAL_TABLET | Freq: Every day | ORAL | Status: DC

## 2018-05-17 MED ORDER — POLYETHYLENE GLYCOL 3350 17 G PO PACK: 17.0000 g | PACK | Freq: Every day | ORAL | 0 refills | Status: DC | PRN

## 2018-05-17 MED ORDER — GUAIFENESIN ER 600 MG PO TB12: 1200.0000 mg | ORAL_TABLET | Freq: Two times a day (BID) | ORAL | 0 refills | Status: AC

## 2018-05-17 NOTE — Clinical Social Work Placement (Signed)
Patient discharging to [facility] room 105P. Confirmed bed, insurance auth, & faxed required docs. Family will provide transportation Patient and family aware of discharge plan.  RN call report: [phone] - ask for [place]  NOTE  Date:  05/17/2018  Patient Details  Name: Benjamin George MRN: [id number] Date of Birth: February 25, 1941  Clinical Social Work is seeking post-discharge placement for this patient at the [place] level of care (*CSW will initial, date and re-position this form in  chart as items are completed):  Yes   Patient/family provided with [place] Work Department's list of facilities offering this level of care within the geographic area requested by the patient (or if unable, by the patient's family).  Yes   Patient/family informed of their freedom to choose among providers that offer the needed level of care, that participate in Medicare, Medicaid or managed care program needed by the patient, have an available bed and are willing to accept the patient.      Patient/family informed of [facility]'s ownership interest in [facility] and [facility], as well as of the fact that they are under no obligation to receive care at these facilities.  PASRR submitted to EDS on       PASRR number received on 05/15/18     Existing PASRR number confirmed on       FL2 transmitted to all facilities in geographic area requested by pt/family on 05/15/18     FL2 transmitted to all facilities within larger geographic area on 05/15/18     Patient informed that his/her managed care company has contracts with or will negotiate with certain facilities, including the following:        Yes   Patient/family informed of bed offers received.  Patient chooses bed at [facility]     Physician recommends and patient chooses bed at      Patient to be transferred to [facility] on  05/17/18.  Patient to be transferred to facility by Family     Patient family notified on 05/17/18 of transfer.  Name of family member notified:  Ruthann: Spouse     PHYSICIAN Please prepare prescriptions     Additional Comment:    _______________________________________________ Pricilla Holm, [place] 05/17/2018, 11:52 AM

## 2018-05-17 NOTE — Progress Notes (Signed)
Attempted to call report to [facility] 4 times with no answer. Left message with secretary Deborah Chalk to have nurse call back for report.   Wife and daughter at bedside paper work given to wife. Family to  transport patient to facility

## 2018-05-17 NOTE — Discharge Summary (Signed)
Physician Discharge Summary  Benjamin George BJS:[id number] DOB: 1940-06-14 DOA: 05/12/2018  PCP: [name], MD  Admit date: 05/12/2018 Discharge date: 05/17/2018  Time spent: 50 minutes  Recommendations for Outpatient Follow-up:  1. Follow-up with MD at skilled nursing facility.  Patient will need a basic metabolic profile done in 1 week to follow-up on electrolytes and renal function.  Patient's cardiac medications of Imdur, losartan and HCTZ were held secondary to borderline blood pressure and may be resumed in 3 to 4 days if blood pressure remains stable.   Discharge Diagnoses:  Principal Problem:   Community acquired pneumonia of left lower lobe of lung ([place]) Active Problems:   Obstructive sleep apnea   Essential hypertension   CAD (coronary artery disease)   Memory disorder   Ischemic cardiomyopathy   CAP (community acquired pneumonia)   Hypotension   Discharge Condition: Stable and improved  Diet recommendation: Heart healthy  Filed Weights   05/12/18 1450 05/13/18 0035 05/15/18 0448  Weight: 110.3 kg 107.1 kg 106.3 kg    History of present illness:  Per Dr. Melonie George is a 78 y.o. male with history of dementia, CAD status post remote stenting, cardiomyopathy with improvement in EF last 2D echo showed EF of 55 to 60% with grade 1 diastolic dysfunction in 6073 was brought to the ER the patient was feeling weak.  As per the ER physician patient wife stated that last evening on trying to help patient out of the car patient was feeling weak and slid onto the floor but did not hit his head.  Felt weak and had been exam low back pain and urinary symptoms last few days.  EMS was called patient had a temperature of 100.8 F for which patient was given Tylenol and brought to the ER.  ED Course: In the ER patient appearred confused which was baseline as per the patient's wife who discussed with the ER physician.  Did not complain of any chest pain.  In the ER patient  had one episode of nausea vomiting.  CT lumbar spine was negative for anything acute chest x-ray shows possibility of pneumonia.  Given the generalized weakness and possible pneumonia patient was started on antibiotics and admitted for further observation.  EKG shows sinus rhythm with ectopic atrial rhythm.  Troponin was negative.  On exam patient was moving all extremities.  UA is unremarkable.   Hospital Course:  1 community-acquired pneumonia Patient had presented with significant generalized weakness, fever, chest x-ray concerning for pneumonia.  Influenza PCR negative.  Urine strep pneumococcus antigen negative.  Fever curve trended down.  Patient initially placed empirically on IV Rocephin and IV azithromycin.  Patient improved clinically.  Patient remained afebrile.  Patient was subsequently transitioned to oral Vantin.  Patient will be discharged on 3 more days of oral Vantin and oral azithromycin to complete a course of antibiotic treatment.  Outpatient follow-up.   2.  Hypotension Patient noted to be hypotensive on 05/13/2018 in the afternoon with systolic blood pressures in the 90s.  Blood pressure noted to be systolic blood pressure of 90s the afternoon of 05/14/2018.  Patient's Imdur, HCTZ, Cozaar were discontinued.  Patient placed on gentle hydration.  Blood pressure improved.  Patient's Imdur, losartan HCTZ may be resumed in about 3 to 4 days if blood pressure remains stable at the skilled nursing facility.   3.  Dementia Patient maintained on home regimen of Namenda.    4.  Hypertension/hypotension Patient noted to be hypotensive the  afternoon of 05/13/2018 as well as the afternoon of 05/14/2018.  Antihypertensive medications have been discontinued.  Patient placed on gentle IV hydration.  Blood pressure improved.  Patient's imdur, losartan HCTZ were held during the hospitalization and may be resumed at skilled nursing facility in about 3 to 4 days if blood pressure remained stable.   Outpatient follow-up.  5.  Weakness Patient with significant generalized weakness.  Patient seen by physical therapy who are recommending skilled nursing facility as patient seems unsafe to be discharged home with home health at this time and is a 2 person assist.  Patient not at his baseline physically.  Patient noted to be driving prior to admission.    Patient was followed by PT/OT during the hospitalization.  Patient will be discharged to a skilled nursing facility.  6.  Obstructive sleep apnea CPAP nightly.  7.  Coronary artery disease status post stenting/cardiomyopathy Per last 2D echo EF 55 to 60% with grade 1 diastolic dysfunction.  Patient denied any chest pain.  Patient euvolemic on examination.  Due to patient's hypotension discontinued Imdur, Cozaar, HCTZ.  Cozaar was resumed on 05/15/2018, however will discontinued due to borderline blood pressure.  Patient maintained on home regimen over atenolol.  Patient's Imdur, losartan HCTZ may be resumed at skilled nursing facility in about 3 to 4 days if blood pressure remained stable.      Procedures:  CT L-spine 05/12/2018  Chest x-ray 05/12/2018   Consultations:  None  Discharge Exam: Vitals:   05/16/18 2005 05/17/18 0555  BP: (!) 170/67 (!) 125/51  Pulse: 62 (!) 54  Resp: 16 16  Temp: 98.3 F (36.8 C) 97.7 F (36.5 C)  SpO2: 97% 95%    General: NAD Cardiovascular: RRR Respiratory: CTAB  Discharge Instructions   Discharge Instructions    Diet - low sodium heart healthy   Complete by:  As directed    Increase activity slowly   Complete by:  As directed      Allergies as of 05/17/2018      Reactions   Codeine Nausea And Vomiting   Hydrocodone Nausea And Vomiting   Aricept [donepezil Hcl]    Nausea   Oxycontin [oxycodone Hcl]    Dizzy and uneasy   Methocarbamol Nausea Only   Other Nausea Only   vibramyacin      Medication List    STOP taking these medications   ondansetron 4 MG tablet Commonly  known as:  ZOFRAN     TAKE these medications   acetaminophen 325 MG tablet Commonly known as:  TYLENOL Take 2 tablets (650 mg total) by mouth every 6 (six) hours as needed for mild pain or moderate pain.   allopurinol 100 MG tablet Commonly known as:  ZYLOPRIM Take 100 mg by mouth every morning.   aspirin 81 MG tablet Take 81 mg by mouth daily.   atenolol 50 MG tablet Commonly known as:  TENORMIN Take 1 tablet (50 mg total) by mouth daily.   atorvastatin 20 MG tablet Commonly known as:  LIPITOR TAKE 1 TABLET BY MOUTH EVERY DAY   azithromycin 500 MG tablet Commonly known as:  ZITHROMAX Take 1 tablet (500 mg total) by mouth daily for 3 days.   cefdinir 300 MG capsule Commonly known as:  OMNICEF Take 1 capsule (300 mg total) by mouth every 12 (twelve) hours for 3 days.   CINNAMON PO Take 1,000 mg by mouth daily.   Coenzyme Q10 200 MG capsule Take 200 mg by mouth daily.  guaiFENesin 600 MG 12 hr tablet Commonly known as:  MUCINEX Take 2 tablets (1,200 mg total) by mouth 2 (two) times daily for 3 days.   isosorbide mononitrate 30 MG 24 hr tablet Commonly known as:  IMDUR Take 1 tablet (30 mg total) by mouth daily. Start taking on:  May 19, 2018 What changed:  These instructions start on May 19, 2018. If you are unsure what to do until then, ask your doctor or other care provider.   LINZESS 290 MCG Caps capsule Generic drug:  linaclotide Take 290 mcg by mouth daily before breakfast.   losartan-hydrochlorothiazide 100-12.5 MG tablet Commonly known as:  HYZAAR Take 1 tablet by mouth daily. Start taking on:  May 20, 2018 What changed:  These instructions start on May 20, 2018. If you are unsure what to do until then, ask your doctor or other care provider.   meclizine 25 MG tablet Commonly known as:  ANTIVERT Take 25 mg by mouth 2 (two) times daily as needed (for vertigo).   memantine 10 MG tablet Commonly known as:  NAMENDA TAKE 1 TABLET BY  MOUTH TWICE A DAY   multivitamin tablet Take 1 tablet by mouth every morning.   polyethylene glycol packet Commonly known as:  MIRALAX / GLYCOLAX Take 17 g by mouth daily as needed for mild constipation.   traMADol 50 MG tablet Commonly known as:  ULTRAM Take 1 tablet (50 mg total) by mouth every 6 (six) hours as needed. What changed:  how much to take   vitamin B-12 100 MCG tablet Commonly known as:  CYANOCOBALAMIN Take 200 mcg by mouth every morning.   vitamin C 500 MG tablet Commonly known as:  ASCORBIC ACID Take 500 mg by mouth every morning.            Durable Medical Equipment  (From admission, onward)         Start     Ordered   05/14/18 1129  For home use only DME Walker rolling  Once    Question:  Patient needs a walker to treat with the following condition  Answer:  Unsteady gait   05/14/18 1129   05/14/18 0806  For home use only DME Walker rolling  Once    Question:  Patient needs a walker to treat with the following condition  Answer:  Debility   05/14/18 0806         Allergies  Allergen Reactions  . Codeine Nausea And Vomiting  . Hydrocodone Nausea And Vomiting  . Aricept [Donepezil Hcl]     Nausea  . Oxycontin [Oxycodone Hcl]     Dizzy and uneasy  . Methocarbamol Nausea Only  . Other Nausea Only    vibramyacin    Contact information for follow-up providers    MD AT SNF Follow up.            Contact information for after-discharge care    Destination    [facility] .   Service:  Skilled Nursing Contact information: [place] [phone]                   The results of significant diagnostics from this hospitalization (including imaging, microbiology, ancillary and laboratory) are listed below for reference.    Significant Diagnostic Studies: Dg Chest 2 View  Result Date: 05/12/2018 CLINICAL DATA:  78 y/o  M; weakness, UTI, fever. EXAM: CHEST - 2 VIEW COMPARISON:   06/08/2015 chest radiograph FINDINGS:  Mildly enlarged cardiac silhouette given projection and technique. Aortic calcific atherosclerosis. Ill-defined left basilar opacity. No pleural effusion or pneumothorax. No acute osseous abnormality is evident. Thoracic spine stimulators noted. IMPRESSION: Ill-defined left basilar opacity may represent atelectasis or pneumonia. Mildly enlarged cardiac silhouette. Electronically Signed   By: Kristine Garbe M.D.   On: 05/12/2018 22:03   Ct Lumbar Spine W Contrast  Result Date: 05/12/2018 CLINICAL DATA:  Back pain.  Fever. EXAM: CT LUMBAR SPINE WITH CONTRAST TECHNIQUE: Multidetector CT imaging of the lumbar spine was performed with intravenous contrast administration. CONTRAST:  121mL ISOVUE-300 IOPAMIDOL (ISOVUE-300) INJECTION 61% COMPARISON:  Lumbar spine radiographs 11/21/2016 FINDINGS: Segmentation: 5 non rib-bearing lumbar type vertebral bodies are present. The lowest fully formed vertebral body is L5. Alignment: Degenerative retrolisthesis is present at L2-3 and L3-4. There is slight retrolisthesis at L5-S1. Mild rightward curvature is present in the lower thoracic spine. Leftward curvature in the lower lumbar spine results in asymmetric right-sided endplate degenerative changes. Vertebrae: Asymmetric right-sided degenerative changes are noted in the lower lumbar spine associated with left-sided curvature. Vertebral body heights are maintained. Subchondral cysts are present without other focal lytic or blastic lesions. Paraspinal and other soft tissues: Atherosclerotic calcifications are present in the aorta and branch vessels. A 10 mm cyst is noted posteriorly in the left kidney. No other solid organ lesions are present. There is no significant adenopathy. Disc levels: Spinal cord stimulator enters the canal at T11-12. Thoracic spine canal and foramina are normal. L1-2: A vacuum disc is present. A broad-based disc protrusion is asymmetric to the left. Mild left  subarticular and foraminal narrowing is present. L2-3: A vacuum disc is present. A broad-based disc protrusion is present. Mild subarticular narrowing is worse on the left. Moderate foraminal stenosis is worse on the right. L3-4: A broad-based disc protrusion is present. Mild facet hypertrophy and ligamentum flavum thickening is noted. Moderate subarticular narrowing is present bilaterally. There is severe bilateral foraminal stenosis. L4-5: A broad-based disc protrusion is present. Laminectomy is noted. Severe right and moderate left foraminal stenosis is present. Moderate subarticular narrowing is present bilaterally. L5-S1: A broad-based disc protrusion is present. There is fusion across the disc space. Severe right moderate left foraminal stenosis is present. Subarticular narrowing is worse on the right. IMPRESSION: 1. No definite fluid collections or evidence for infection. No osseous erosion. 2. Extensive degenerative change with leftward curvature of the lumbar spine and asymmetric right-sided stenosis. Electronically Signed   By: San Morelle M.D.   On: 05/12/2018 19:01    Microbiology: Recent Results (from the past 240 hour(s))  Urine culture     Status: Abnormal   Collection Time: 05/12/18  5:03 PM  Result Value Ref Range Status   Specimen Description   Final    URINE, RANDOM Performed at [facility] [address], [place], [place] [address]    Special Requests   Final    NONE Performed at [facility] [place] [address] [place], [place] [address]    Culture (A)  Final    <10,000 COLONIES/mL INSIGNIFICANT GROWTH Performed at [facility] [address], [place], [place] [address]    Report Status 05/14/2018 FINAL  Final     Labs: Basic Metabolic Panel: Recent Labs  Lab 05/12/18 1557 05/13/18 0057 05/14/18 0552 05/15/18 0535 05/16/18 0554  NA 133* 135 135 137 137  K 5.5* 4.0 3.7 3.7 3.6  CL 101 103 103 106 107  CO2 26  23 22  24 24  GLUCOSE 96 141* 98 93 90  BUN 21 17 18 17 17   CREATININE 1.25* 1.00 1.04 0.95 0.95  CALCIUM 8.1* 8.2* 8.3* 8.6* 8.3*   Liver Function Tests: Recent Labs  Lab 05/12/18 1557  AST 25  ALT 11  ALKPHOS 59  BILITOT 1.3*  PROT 5.8*  ALBUMIN 3.3*   No results for input(s): LIPASE, AMYLASE in the last 168 hours. No results for input(s): AMMONIA in the last 168 hours. CBC: Recent Labs  Lab 05/12/18 1557 05/13/18 0057 05/14/18 0552 05/15/18 0535 05/16/18 0554  WBC 10.0 8.4 7.5 6.4 8.3  NEUTROABS 7.7  --   --  3.1  --   HGB 12.9* 12.9* 12.4* 12.6* 13.2  HCT 38.8* 39.0 37.5* 36.9* 39.3  MCV 94.4 93.5 90.8 89.6 92.7  PLT 187 159 184 199 196   Cardiac Enzymes: Recent Labs  Lab 05/13/18 0057 05/13/18 0640 05/13/18 1204  TROPONINI <0.03 <0.03 <0.03   BNP: BNP (last 3 results) No results for input(s): BNP in the last 8760 hours.  ProBNP (last 3 results) No results for input(s): PROBNP in the last 8760 hours.  CBG: No results for input(s): GLUCAP in the last 168 hours.     Signed:  Irine Seal MD.  [facility] 05/17/2018, 10:42 AM

## 2018-05-19 DIAGNOSIS — I428 Other cardiomyopathies: Secondary | ICD-10-CM | POA: Diagnosis not present

## 2018-05-19 DIAGNOSIS — R0989 Other specified symptoms and signs involving the circulatory and respiratory systems: Secondary | ICD-10-CM | POA: Diagnosis not present

## 2018-05-19 DIAGNOSIS — J188 Other pneumonia, unspecified organism: Secondary | ICD-10-CM | POA: Diagnosis not present

## 2018-05-19 DIAGNOSIS — G4733 Obstructive sleep apnea (adult) (pediatric): Secondary | ICD-10-CM | POA: Diagnosis not present

## 2018-05-19 DIAGNOSIS — R531 Weakness: Secondary | ICD-10-CM | POA: Diagnosis not present

## 2018-05-20 DIAGNOSIS — R531 Weakness: Secondary | ICD-10-CM | POA: Diagnosis not present

## 2018-05-20 DIAGNOSIS — I428 Other cardiomyopathies: Secondary | ICD-10-CM | POA: Diagnosis not present

## 2018-05-20 DIAGNOSIS — I2510 Atherosclerotic heart disease of native coronary artery without angina pectoris: Secondary | ICD-10-CM | POA: Diagnosis not present

## 2018-05-20 DIAGNOSIS — G4733 Obstructive sleep apnea (adult) (pediatric): Secondary | ICD-10-CM | POA: Diagnosis not present

## 2018-05-20 DIAGNOSIS — J188 Other pneumonia, unspecified organism: Secondary | ICD-10-CM | POA: Diagnosis not present

## 2018-06-08 DIAGNOSIS — I255 Ischemic cardiomyopathy: Secondary | ICD-10-CM | POA: Diagnosis not present

## 2018-06-08 DIAGNOSIS — G4733 Obstructive sleep apnea (adult) (pediatric): Secondary | ICD-10-CM | POA: Diagnosis not present

## 2018-06-08 DIAGNOSIS — I2510 Atherosclerotic heart disease of native coronary artery without angina pectoris: Secondary | ICD-10-CM | POA: Diagnosis not present

## 2018-06-08 DIAGNOSIS — Z7982 Long term (current) use of aspirin: Secondary | ICD-10-CM | POA: Diagnosis not present

## 2018-06-08 DIAGNOSIS — M48061 Spinal stenosis, lumbar region without neurogenic claudication: Secondary | ICD-10-CM | POA: Diagnosis not present

## 2018-06-08 DIAGNOSIS — I959 Hypotension, unspecified: Secondary | ICD-10-CM | POA: Diagnosis not present

## 2018-06-08 DIAGNOSIS — I119 Hypertensive heart disease without heart failure: Secondary | ICD-10-CM | POA: Diagnosis not present

## 2018-06-08 DIAGNOSIS — M6281 Muscle weakness (generalized): Secondary | ICD-10-CM | POA: Diagnosis not present

## 2018-06-08 DIAGNOSIS — Z95828 Presence of other vascular implants and grafts: Secondary | ICD-10-CM | POA: Diagnosis not present

## 2018-06-11 DIAGNOSIS — G4733 Obstructive sleep apnea (adult) (pediatric): Secondary | ICD-10-CM | POA: Diagnosis not present

## 2018-06-11 DIAGNOSIS — I959 Hypotension, unspecified: Secondary | ICD-10-CM | POA: Diagnosis not present

## 2018-06-11 DIAGNOSIS — I119 Hypertensive heart disease without heart failure: Secondary | ICD-10-CM | POA: Diagnosis not present

## 2018-06-11 DIAGNOSIS — M48061 Spinal stenosis, lumbar region without neurogenic claudication: Secondary | ICD-10-CM | POA: Diagnosis not present

## 2018-06-11 DIAGNOSIS — M6281 Muscle weakness (generalized): Secondary | ICD-10-CM | POA: Diagnosis not present

## 2018-06-11 DIAGNOSIS — I2510 Atherosclerotic heart disease of native coronary artery without angina pectoris: Secondary | ICD-10-CM | POA: Diagnosis not present

## 2018-06-11 DIAGNOSIS — Z95828 Presence of other vascular implants and grafts: Secondary | ICD-10-CM | POA: Diagnosis not present

## 2018-06-11 DIAGNOSIS — I255 Ischemic cardiomyopathy: Secondary | ICD-10-CM | POA: Diagnosis not present

## 2018-06-11 DIAGNOSIS — Z7982 Long term (current) use of aspirin: Secondary | ICD-10-CM | POA: Diagnosis not present

## 2018-06-13 DIAGNOSIS — M48061 Spinal stenosis, lumbar region without neurogenic claudication: Secondary | ICD-10-CM | POA: Diagnosis not present

## 2018-06-13 DIAGNOSIS — I119 Hypertensive heart disease without heart failure: Secondary | ICD-10-CM | POA: Diagnosis not present

## 2018-06-13 DIAGNOSIS — M6281 Muscle weakness (generalized): Secondary | ICD-10-CM | POA: Diagnosis not present

## 2018-06-13 DIAGNOSIS — Z7982 Long term (current) use of aspirin: Secondary | ICD-10-CM | POA: Diagnosis not present

## 2018-06-13 DIAGNOSIS — I959 Hypotension, unspecified: Secondary | ICD-10-CM | POA: Diagnosis not present

## 2018-06-13 DIAGNOSIS — G4733 Obstructive sleep apnea (adult) (pediatric): Secondary | ICD-10-CM | POA: Diagnosis not present

## 2018-06-13 DIAGNOSIS — I2510 Atherosclerotic heart disease of native coronary artery without angina pectoris: Secondary | ICD-10-CM | POA: Diagnosis not present

## 2018-06-13 DIAGNOSIS — I255 Ischemic cardiomyopathy: Secondary | ICD-10-CM | POA: Diagnosis not present

## 2018-06-13 DIAGNOSIS — Z95828 Presence of other vascular implants and grafts: Secondary | ICD-10-CM | POA: Diagnosis not present

## 2018-06-16 DIAGNOSIS — K5900 Constipation, unspecified: Secondary | ICD-10-CM | POA: Diagnosis not present

## 2018-06-16 DIAGNOSIS — I2510 Atherosclerotic heart disease of native coronary artery without angina pectoris: Secondary | ICD-10-CM | POA: Diagnosis not present

## 2018-06-16 DIAGNOSIS — M109 Gout, unspecified: Secondary | ICD-10-CM | POA: Diagnosis not present

## 2018-06-16 DIAGNOSIS — R739 Hyperglycemia, unspecified: Secondary | ICD-10-CM | POA: Diagnosis not present

## 2018-06-16 DIAGNOSIS — I10 Essential (primary) hypertension: Secondary | ICD-10-CM | POA: Diagnosis not present

## 2018-06-17 DIAGNOSIS — M48061 Spinal stenosis, lumbar region without neurogenic claudication: Secondary | ICD-10-CM | POA: Diagnosis not present

## 2018-06-17 DIAGNOSIS — I255 Ischemic cardiomyopathy: Secondary | ICD-10-CM | POA: Diagnosis not present

## 2018-06-17 DIAGNOSIS — G4733 Obstructive sleep apnea (adult) (pediatric): Secondary | ICD-10-CM | POA: Diagnosis not present

## 2018-06-17 DIAGNOSIS — Z95828 Presence of other vascular implants and grafts: Secondary | ICD-10-CM | POA: Diagnosis not present

## 2018-06-17 DIAGNOSIS — I959 Hypotension, unspecified: Secondary | ICD-10-CM | POA: Diagnosis not present

## 2018-06-17 DIAGNOSIS — I2510 Atherosclerotic heart disease of native coronary artery without angina pectoris: Secondary | ICD-10-CM | POA: Diagnosis not present

## 2018-06-17 DIAGNOSIS — Z7982 Long term (current) use of aspirin: Secondary | ICD-10-CM | POA: Diagnosis not present

## 2018-06-17 DIAGNOSIS — M6281 Muscle weakness (generalized): Secondary | ICD-10-CM | POA: Diagnosis not present

## 2018-06-17 DIAGNOSIS — I119 Hypertensive heart disease without heart failure: Secondary | ICD-10-CM | POA: Diagnosis not present

## 2018-06-19 DIAGNOSIS — M6281 Muscle weakness (generalized): Secondary | ICD-10-CM | POA: Diagnosis not present

## 2018-06-19 DIAGNOSIS — Z95828 Presence of other vascular implants and grafts: Secondary | ICD-10-CM | POA: Diagnosis not present

## 2018-06-19 DIAGNOSIS — M48061 Spinal stenosis, lumbar region without neurogenic claudication: Secondary | ICD-10-CM | POA: Diagnosis not present

## 2018-06-19 DIAGNOSIS — Z7982 Long term (current) use of aspirin: Secondary | ICD-10-CM | POA: Diagnosis not present

## 2018-06-19 DIAGNOSIS — I959 Hypotension, unspecified: Secondary | ICD-10-CM | POA: Diagnosis not present

## 2018-06-19 DIAGNOSIS — I255 Ischemic cardiomyopathy: Secondary | ICD-10-CM | POA: Diagnosis not present

## 2018-06-19 DIAGNOSIS — I119 Hypertensive heart disease without heart failure: Secondary | ICD-10-CM | POA: Diagnosis not present

## 2018-06-19 DIAGNOSIS — I2510 Atherosclerotic heart disease of native coronary artery without angina pectoris: Secondary | ICD-10-CM | POA: Diagnosis not present

## 2018-06-19 DIAGNOSIS — G4733 Obstructive sleep apnea (adult) (pediatric): Secondary | ICD-10-CM | POA: Diagnosis not present

## 2018-06-23 DIAGNOSIS — G894 Chronic pain syndrome: Secondary | ICD-10-CM | POA: Diagnosis not present

## 2018-06-23 DIAGNOSIS — M47816 Spondylosis without myelopathy or radiculopathy, lumbar region: Secondary | ICD-10-CM | POA: Diagnosis not present

## 2018-06-23 DIAGNOSIS — M5136 Other intervertebral disc degeneration, lumbar region: Secondary | ICD-10-CM | POA: Diagnosis not present

## 2018-06-23 DIAGNOSIS — M5137 Other intervertebral disc degeneration, lumbosacral region: Secondary | ICD-10-CM | POA: Diagnosis not present

## 2018-06-24 DIAGNOSIS — Z95828 Presence of other vascular implants and grafts: Secondary | ICD-10-CM | POA: Diagnosis not present

## 2018-06-24 DIAGNOSIS — M48061 Spinal stenosis, lumbar region without neurogenic claudication: Secondary | ICD-10-CM | POA: Diagnosis not present

## 2018-06-24 DIAGNOSIS — M6281 Muscle weakness (generalized): Secondary | ICD-10-CM | POA: Diagnosis not present

## 2018-06-24 DIAGNOSIS — I959 Hypotension, unspecified: Secondary | ICD-10-CM | POA: Diagnosis not present

## 2018-06-24 DIAGNOSIS — G4733 Obstructive sleep apnea (adult) (pediatric): Secondary | ICD-10-CM | POA: Diagnosis not present

## 2018-06-24 DIAGNOSIS — I119 Hypertensive heart disease without heart failure: Secondary | ICD-10-CM | POA: Diagnosis not present

## 2018-06-24 DIAGNOSIS — I2510 Atherosclerotic heart disease of native coronary artery without angina pectoris: Secondary | ICD-10-CM | POA: Diagnosis not present

## 2018-06-24 DIAGNOSIS — I255 Ischemic cardiomyopathy: Secondary | ICD-10-CM | POA: Diagnosis not present

## 2018-06-24 DIAGNOSIS — Z7982 Long term (current) use of aspirin: Secondary | ICD-10-CM | POA: Diagnosis not present

## 2018-06-25 DIAGNOSIS — I10 Essential (primary) hypertension: Secondary | ICD-10-CM | POA: Diagnosis not present

## 2018-06-25 DIAGNOSIS — Z7982 Long term (current) use of aspirin: Secondary | ICD-10-CM | POA: Diagnosis not present

## 2018-06-25 DIAGNOSIS — I119 Hypertensive heart disease without heart failure: Secondary | ICD-10-CM | POA: Diagnosis not present

## 2018-06-25 DIAGNOSIS — M48061 Spinal stenosis, lumbar region without neurogenic claudication: Secondary | ICD-10-CM | POA: Diagnosis not present

## 2018-06-25 DIAGNOSIS — I2510 Atherosclerotic heart disease of native coronary artery without angina pectoris: Secondary | ICD-10-CM | POA: Diagnosis not present

## 2018-06-25 DIAGNOSIS — M6281 Muscle weakness (generalized): Secondary | ICD-10-CM | POA: Diagnosis not present

## 2018-06-25 DIAGNOSIS — Z95828 Presence of other vascular implants and grafts: Secondary | ICD-10-CM | POA: Diagnosis not present

## 2018-06-25 DIAGNOSIS — I255 Ischemic cardiomyopathy: Secondary | ICD-10-CM | POA: Diagnosis not present

## 2018-06-25 DIAGNOSIS — I959 Hypotension, unspecified: Secondary | ICD-10-CM | POA: Diagnosis not present

## 2018-06-25 DIAGNOSIS — G4733 Obstructive sleep apnea (adult) (pediatric): Secondary | ICD-10-CM | POA: Diagnosis not present

## 2018-07-01 DIAGNOSIS — I255 Ischemic cardiomyopathy: Secondary | ICD-10-CM | POA: Diagnosis not present

## 2018-07-01 DIAGNOSIS — I119 Hypertensive heart disease without heart failure: Secondary | ICD-10-CM | POA: Diagnosis not present

## 2018-07-01 DIAGNOSIS — M6281 Muscle weakness (generalized): Secondary | ICD-10-CM | POA: Diagnosis not present

## 2018-07-01 DIAGNOSIS — Z95828 Presence of other vascular implants and grafts: Secondary | ICD-10-CM | POA: Diagnosis not present

## 2018-07-01 DIAGNOSIS — I2510 Atherosclerotic heart disease of native coronary artery without angina pectoris: Secondary | ICD-10-CM | POA: Diagnosis not present

## 2018-07-01 DIAGNOSIS — Z7982 Long term (current) use of aspirin: Secondary | ICD-10-CM | POA: Diagnosis not present

## 2018-07-01 DIAGNOSIS — M48061 Spinal stenosis, lumbar region without neurogenic claudication: Secondary | ICD-10-CM | POA: Diagnosis not present

## 2018-07-01 DIAGNOSIS — I959 Hypotension, unspecified: Secondary | ICD-10-CM | POA: Diagnosis not present

## 2018-07-01 DIAGNOSIS — G4733 Obstructive sleep apnea (adult) (pediatric): Secondary | ICD-10-CM | POA: Diagnosis not present

## 2018-07-02 DIAGNOSIS — I119 Hypertensive heart disease without heart failure: Secondary | ICD-10-CM | POA: Diagnosis not present

## 2018-07-02 DIAGNOSIS — M48061 Spinal stenosis, lumbar region without neurogenic claudication: Secondary | ICD-10-CM | POA: Diagnosis not present

## 2018-07-02 DIAGNOSIS — I255 Ischemic cardiomyopathy: Secondary | ICD-10-CM | POA: Diagnosis not present

## 2018-07-02 DIAGNOSIS — I2510 Atherosclerotic heart disease of native coronary artery without angina pectoris: Secondary | ICD-10-CM | POA: Diagnosis not present

## 2018-07-03 DIAGNOSIS — I2510 Atherosclerotic heart disease of native coronary artery without angina pectoris: Secondary | ICD-10-CM | POA: Diagnosis not present

## 2018-07-03 DIAGNOSIS — Z95828 Presence of other vascular implants and grafts: Secondary | ICD-10-CM | POA: Diagnosis not present

## 2018-07-03 DIAGNOSIS — M48061 Spinal stenosis, lumbar region without neurogenic claudication: Secondary | ICD-10-CM | POA: Diagnosis not present

## 2018-07-03 DIAGNOSIS — I255 Ischemic cardiomyopathy: Secondary | ICD-10-CM | POA: Diagnosis not present

## 2018-07-03 DIAGNOSIS — Z7982 Long term (current) use of aspirin: Secondary | ICD-10-CM | POA: Diagnosis not present

## 2018-07-03 DIAGNOSIS — I119 Hypertensive heart disease without heart failure: Secondary | ICD-10-CM | POA: Diagnosis not present

## 2018-07-03 DIAGNOSIS — G4733 Obstructive sleep apnea (adult) (pediatric): Secondary | ICD-10-CM | POA: Diagnosis not present

## 2018-07-03 DIAGNOSIS — M6281 Muscle weakness (generalized): Secondary | ICD-10-CM | POA: Diagnosis not present

## 2018-07-03 DIAGNOSIS — I959 Hypotension, unspecified: Secondary | ICD-10-CM | POA: Diagnosis not present

## 2018-07-10 DIAGNOSIS — Z95828 Presence of other vascular implants and grafts: Secondary | ICD-10-CM | POA: Diagnosis not present

## 2018-07-10 DIAGNOSIS — I2510 Atherosclerotic heart disease of native coronary artery without angina pectoris: Secondary | ICD-10-CM | POA: Diagnosis not present

## 2018-07-10 DIAGNOSIS — Z7982 Long term (current) use of aspirin: Secondary | ICD-10-CM | POA: Diagnosis not present

## 2018-07-10 DIAGNOSIS — M48061 Spinal stenosis, lumbar region without neurogenic claudication: Secondary | ICD-10-CM | POA: Diagnosis not present

## 2018-07-10 DIAGNOSIS — M6281 Muscle weakness (generalized): Secondary | ICD-10-CM | POA: Diagnosis not present

## 2018-07-10 DIAGNOSIS — I119 Hypertensive heart disease without heart failure: Secondary | ICD-10-CM | POA: Diagnosis not present

## 2018-07-10 DIAGNOSIS — I959 Hypotension, unspecified: Secondary | ICD-10-CM | POA: Diagnosis not present

## 2018-07-10 DIAGNOSIS — I255 Ischemic cardiomyopathy: Secondary | ICD-10-CM | POA: Diagnosis not present

## 2018-07-10 DIAGNOSIS — G4733 Obstructive sleep apnea (adult) (pediatric): Secondary | ICD-10-CM | POA: Diagnosis not present

## 2018-07-16 DIAGNOSIS — Z7982 Long term (current) use of aspirin: Secondary | ICD-10-CM | POA: Diagnosis not present

## 2018-07-16 DIAGNOSIS — I2510 Atherosclerotic heart disease of native coronary artery without angina pectoris: Secondary | ICD-10-CM | POA: Diagnosis not present

## 2018-07-16 DIAGNOSIS — I959 Hypotension, unspecified: Secondary | ICD-10-CM | POA: Diagnosis not present

## 2018-07-16 DIAGNOSIS — Z95828 Presence of other vascular implants and grafts: Secondary | ICD-10-CM | POA: Diagnosis not present

## 2018-07-16 DIAGNOSIS — G4733 Obstructive sleep apnea (adult) (pediatric): Secondary | ICD-10-CM | POA: Diagnosis not present

## 2018-07-16 DIAGNOSIS — I255 Ischemic cardiomyopathy: Secondary | ICD-10-CM | POA: Diagnosis not present

## 2018-07-16 DIAGNOSIS — M48061 Spinal stenosis, lumbar region without neurogenic claudication: Secondary | ICD-10-CM | POA: Diagnosis not present

## 2018-07-16 DIAGNOSIS — I119 Hypertensive heart disease without heart failure: Secondary | ICD-10-CM | POA: Diagnosis not present

## 2018-07-16 DIAGNOSIS — M6281 Muscle weakness (generalized): Secondary | ICD-10-CM | POA: Diagnosis not present

## 2018-07-21 DIAGNOSIS — Z0000 Encounter for general adult medical examination without abnormal findings: Secondary | ICD-10-CM | POA: Diagnosis not present

## 2018-07-21 DIAGNOSIS — R413 Other amnesia: Secondary | ICD-10-CM | POA: Diagnosis not present

## 2018-07-21 DIAGNOSIS — M5136 Other intervertebral disc degeneration, lumbar region: Secondary | ICD-10-CM | POA: Diagnosis not present

## 2018-07-21 DIAGNOSIS — I2510 Atherosclerotic heart disease of native coronary artery without angina pectoris: Secondary | ICD-10-CM | POA: Diagnosis not present

## 2018-07-21 DIAGNOSIS — M109 Gout, unspecified: Secondary | ICD-10-CM | POA: Diagnosis not present

## 2018-08-01 DIAGNOSIS — M5136 Other intervertebral disc degeneration, lumbar region: Secondary | ICD-10-CM | POA: Diagnosis not present

## 2018-08-19 ENCOUNTER — Encounter: Payer: Self-pay | Admitting: *Deleted

## 2018-08-19 ENCOUNTER — Telehealth: Payer: Self-pay | Admitting: *Deleted

## 2018-08-19 NOTE — Telephone Encounter (Signed)
Due to current COVID 19 pandemic, our office is severely reducing in office visits until further notice, in order to minimize the risk to our patients and healthcare providers.  Pt understands that although there may be some limitations with this type of visit, we will take all precautions to reduce any security or privacy concerns.  Pt understands that this will be treated like an in office visit and we will file with pt's insurance.  Consented to [email or web address].  Email sent/ [email or web address]. Wife email [email or web address].

## 2018-08-20 ENCOUNTER — Ambulatory Visit (INDEPENDENT_AMBULATORY_CARE_PROVIDER_SITE_OTHER): Payer: Medicare Other | Admitting: Adult Health

## 2018-08-20 ENCOUNTER — Encounter: Payer: Self-pay | Admitting: Adult Health

## 2018-08-20 ENCOUNTER — Other Ambulatory Visit: Payer: Self-pay

## 2018-08-20 DIAGNOSIS — R413 Other amnesia: Secondary | ICD-10-CM | POA: Diagnosis not present

## 2018-08-20 MED ORDER — MEMANTINE HCL 10 MG PO TABS: 10.0000 mg | ORAL_TABLET | Freq: Two times a day (BID) | ORAL | 3 refills | Status: DC

## 2018-08-20 NOTE — Progress Notes (Signed)
I have read the note, and I agree with the clinical assessment and plan.  Dyanara Cozza K Danica Camarena   

## 2018-08-20 NOTE — Progress Notes (Signed)
PATIENT: Benjamin George DOB: [birth year]  REASON FOR VISIT: follow up HISTORY FROM: patient  Virtual Visit via Video Note  I connected with Benjamin George on 08/20/18 at  8:30 AM EDT by a video enabled telemedicine application located remotely at [facility] and verified that I am speaking with the correct person using two identifiers who was located at their own home.   I discussed the limitations of evaluation and management by telemedicine and the availability of in person appointments. The patient expressed understanding and agreed to proceed.   PATIENT: Benjamin George DOB: Dec 31, 1940  REASON FOR VISIT: follow up HISTORY FROM: patient  HISTORY OF PRESENT ILLNESS: Today 08/20/18:  Benjamin George is a 78 year old male with a history of memory disturbance.  He returns today for a virtual visit.  He is with his wife.  He reports that his memory may be slightly worse.  He reports that he is able to complete all ADLs independently.  His wife reports that he does not do much driving other than going to familiar places like [employer].  He denies any trouble sleeping.  Reports good appetite.  No change in mood or behavior.  No hallucinations.  He manages his own medications.  His wife manages their finances.  He uses a walker when ambulating.  He still reports back pain.  In the past he has had a spinal cord stimulator placed.  He joins me today for virtual visit.  HISTORY 12/12/17: Benjamin George is a 78 year old male with a history of memory disturbance.  He returns today for follow-up.  He remains on Namenda.  He continues to live at home with his spouse.  He is able to complete all ADLs independently.  He operates a Teacher, music.  Reports good appetite.  Denies any trouble sleeping.  Denies any changes in his mood or behavior.  Denies hallucinations.  He continues to help the finances.  He manages his own medications and appointments.  He recently had surgery for  spinal stimulator implant.  He returns today for evaluation  REVIEW OF SYSTEMS: Out of a complete 14 system review of symptoms, the patient complains only of the following symptoms, and all other reviewed systems are negative.  See HPI  ALLERGIES: Allergies  Allergen Reactions   Codeine Nausea And Vomiting   Hydrocodone Nausea And Vomiting   Aricept [Donepezil Hcl]     Nausea   Oxycontin [Oxycodone Hcl]     Dizzy and uneasy   Methocarbamol Nausea Only   Other Nausea Only    vibramyacin    HOME MEDICATIONS: Outpatient Medications Prior to Visit  Medication Sig Dispense Refill   acetaminophen (TYLENOL) 325 MG tablet Take 2 tablets (650 mg total) by mouth every 6 (six) hours as needed for mild pain or moderate pain.     allopurinol (ZYLOPRIM) 100 MG tablet Take 100 mg by mouth every morning.      aspirin 81 MG tablet Take 81 mg by mouth daily.     atenolol (TENORMIN) 50 MG tablet Take 1 tablet (50 mg total) by mouth daily. 90 tablet 3   atorvastatin (LIPITOR) 20 MG tablet TAKE 1 TABLET BY MOUTH EVERY DAY 90 tablet 3   CINNAMON PO Take 1,000 mg by mouth daily.     Coenzyme Q10 200 MG capsule Take 200 mg by mouth daily.     isosorbide mononitrate (IMDUR) 30 MG 24 hr tablet Take 1 tablet (30 mg total) by mouth daily. [place]  tablet 0   LINZESS 290 MCG CAPS capsule Take 290 mcg by mouth daily before breakfast.      losartan-hydrochlorothiazide (HYZAAR) 100-12.5 MG tablet Take 1 tablet by mouth daily.     meclizine (ANTIVERT) 25 MG tablet Take 25 mg by mouth 2 (two) times daily as needed (for vertigo).     memantine (NAMENDA) 10 MG tablet TAKE 1 TABLET BY MOUTH TWICE A DAY 180 tablet 2   Multiple Vitamin (MULTIVITAMIN) tablet Take 1 tablet by mouth every morning.      polyethylene glycol (MIRALAX / GLYCOLAX) packet Take 17 g by mouth daily as needed for mild constipation. 14 each 0   traMADol (ULTRAM) 50 MG tablet Take 1 tablet (50 mg total) by mouth every 6 (six) hours  as needed. 20 tablet 0   vitamin B-12 (CYANOCOBALAMIN) 100 MCG tablet Take 200 mcg by mouth every morning.      vitamin C (ASCORBIC ACID) 500 MG tablet Take 500 mg by mouth every morning.     No facility-administered medications prior to visit.     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   PONV (postoperative nausea and vomiting)    Sleep apnea    NPSG 10/26/07 - AHI 16.4 used5 yrs, uses CPAP nightly    PAST SURGICAL HISTORY: Past Surgical History:  Procedure Laterality Date   angioplasty     APPENDECTOMY     CAROTID STENT     CARPECTOMY Left 07/22/2017   Procedure: LEFT WRIST HARDWARE REMOVAL AND PROXIMAL ROW CARPECTOMY;  Surgeon: Milly Jakob, MD;  Location: [facility];  Service: Orthopedics;  Laterality: Left;   CORONARY ANGIOPLASTY WITH STENT PLACEMENT  06/27/2007   L main OK, LAD mild irreg, CFX stent OK, irreg, RCA 60% calcified, EF 45%    HAND SURGERY     left    HARDWARE REMOVAL Left 07/22/2017   Procedure: HARDWARE REMOVAL;  Surgeon: Milly Jakob, MD;  Location: [place] [facility];  Service: Orthopedics;  Laterality: Left;   LUMBAR LAMINECTOMY/DECOMPRESSION MICRODISCECTOMY N/A 05/06/2013   Procedure: Lumbar 4-5 decompression    1 LEVEL;  Surgeon: Sinclair Ship, MD;  Location: [facility];  Service: Orthopedics;  Laterality: N/A;  Lumbar 4-5 decompression   nasal septopalsty     ORIF SCAPHOID FRACTURE Left 01/21/2017   Procedure: OPEN TREATMENT OF LEFT SCAPHOID FRACTURE;  Surgeon: Milly Jakob, MD;  Location: [facility];  Service: Orthopedics;  Laterality: Left;   TOTAL SHOULDER ARTHROPLASTY  04/17/2011   Procedure: TOTAL SHOULDER ARTHROPLASTY; left  Surgeon: Nita Sells, MD;  Location: [facility];  Service: Orthopedics;  Laterality: Left;    FAMILY HISTORY: Family History  Problem Relation Age of Onset   Heart attack Father    Heart disease Father    Hyperlipidemia Father    Other Father        Guillain Barre   Alzheimer's disease Mother    Colon cancer Neg Hx    Esophageal cancer Neg Hx    Rectal cancer Neg Hx    Stomach cancer Neg Hx     SOCIAL HISTORY: Social History   Socioeconomic History  Marital status: Married    Spouse name: Not on file   Number of children: 2   Years of education: College/tech   Highest education level: Not on file  Occupational History   Occupation: Retired  Scientist, product/process development strain: Not hard at [employer] insecurity:    Worry: Patient refused    Inability: Patient refused   Transportation needs:    Medical: Patient refused    Non-medical: Patient refused  Tobacco Use   Smoking status: Former Smoker    Packs/day: 1.50    Years: 30.00    Pack years: 45.00    Types: Cigarettes    Last attempt to quit: 04/28/1987    Years since quitting: 31.3   Smokeless tobacco: Never Used   Tobacco comment: smoked 2 ppd for 32 years; quit in 1989  Substance and Sexual Activity   Alcohol use: Yes    Alcohol/week: 16.0 standard drinks    Types: 14 Cans of beer, 2 Shots of liquor per week    Comment: [place] of beers per day   Drug use: No   Sexual activity: Not on file    Comment: Married  Lifestyle   Physical activity:    Days per week: Not on file    Minutes per session: Not on file   Stress: To some extent  Relationships   Social connections:    Talks on phone: More than three times a week    Gets together: Never    Attends religious service: Never    Active member of club or organization: Yes    Attends meetings of clubs or organizations: More than 4 times per year    Relationship status: Married   Intimate partner violence:    Fear  of current or ex partner: Patient refused    Emotionally abused: Patient refused    Physically abused: Patient refused    Forced sexual activity: Patient refused  Other Topics Concern   Not on file  Social History Narrative   Married, 2 children.    Retired VP at [employer] Exam 08/20/2018 12/12/2017 12/24/2016  Orientation to time 2 0 3  Orientation to Place 5 5 4   Registration 3 3 3   Attention/ Calculation 5 5 5   Recall 0 2 2  Language- name 2 objects 2 2 2   Language- repeat 1 0 0  Language- follow 3 step command 3 3 3   Language- read & follow direction - 1 1  Write a sentence - 1 1  Copy design - 0 1  Total score - 22 25    Generalized: Well developed, in no acute distress   Neurological examination  Mentation: Alert oriented to time, place, history taking. Follows all commands speech and language fluent Cranial nerve II-XII: Pupils were equal round reactive to light. Extraocular movements were full. Facial symmetry noted.  Head turning and shoulder shrug  were normal and symmetric. Motor: Good strength noted throughout subjectively per patient Gait and station: Patient is able to stand without assistance.  Patient uses a walker typically to ambulate.  He ambulates today without a walker. Reflexes: UTA  DIAGNOSTIC DATA (LABS, IMAGING, TESTING) - I reviewed patient records, labs, notes, testing and imaging myself where available.  Lab Results  Component Value Date   WBC 8.3 05/16/2018   HGB 13.2 05/16/2018   HCT 39.3 05/16/2018   MCV 92.7 05/16/2018   PLT  196 05/16/2018      Component Value Date/Time   NA 137 05/16/2018 0554   K 3.6 05/16/2018 0554   CL 107 05/16/2018 0554   CO2 24 05/16/2018 0554   GLUCOSE 90 05/16/2018 0554   BUN 17 05/16/2018 0554   CREATININE 0.95 05/16/2018 0554   CREATININE 1.35 (H) 03/16/2016 1653   CALCIUM 8.3 (L) 05/16/2018 0554   PROT 5.8 (L) 05/12/2018 1557   ALBUMIN 3.3 (L) 05/12/2018  1557   AST 25 05/12/2018 1557   ALT 11 05/12/2018 1557   ALKPHOS 59 05/12/2018 1557   BILITOT 1.3 (H) 05/12/2018 1557   GFRNONAA >60 05/16/2018 0554   GFRAA >60 05/16/2018 0554   Lab Results  Component Value Date   CHOL 155 03/16/2016   HDL 45 03/16/2016   LDLCALC 54 03/16/2016   TRIG 279 (H) 03/16/2016   CHOLHDL 3.4 03/16/2016   No results found for: HGBA1C No results found for: VITAMINB12 Lab Results  Component Value Date   TSH 0.853 05/13/2018      ASSESSMENT AND PLAN 78 y.o. year old male  has a past medical history of Arthritis, Atrial fibrillation ([place]), Cardiac arrest ([place]), Complication of anesthesia, Gout, Gout, Heart attack ([place]), Heart disease, HLD (hyperlipidemia), HTN (hypertension), Memory disorder (12/21/2015), PONV (postoperative nausea and vomiting), and Sleep apnea. here with:  1.  Memory disturbance  The patient is doing records relatively stable.  He will continue on Namenda.  I have advised that if symptoms worsen or he develops new symptoms he should let us know he will follow-up in 6 months or sooner if needed.   I spent 25 minutes with the patient this time was spent reviewing the chart prior to the visit, completing memory exam, discussing symptoms and plan of care.   Ward Givens, MSN, NP-C 08/20/2018, 8:25 AM [facility] [address], [place], [place] [address] [phone]

## 2018-08-27 DIAGNOSIS — M47816 Spondylosis without myelopathy or radiculopathy, lumbar region: Secondary | ICD-10-CM | POA: Diagnosis not present

## 2018-08-27 DIAGNOSIS — G894 Chronic pain syndrome: Secondary | ICD-10-CM | POA: Diagnosis not present

## 2018-08-27 DIAGNOSIS — M5137 Other intervertebral disc degeneration, lumbosacral region: Secondary | ICD-10-CM | POA: Diagnosis not present

## 2018-08-27 DIAGNOSIS — M5136 Other intervertebral disc degeneration, lumbar region: Secondary | ICD-10-CM | POA: Diagnosis not present

## 2018-09-08 ENCOUNTER — Emergency Department (HOSPITAL_COMMUNITY): Payer: Medicare Other

## 2018-09-08 ENCOUNTER — Emergency Department (HOSPITAL_COMMUNITY)
Admission: EM | Admit: 2018-09-08 | Discharge: 2018-09-08 | Disposition: A | Discharge status: Home / Self Care | Payer: Medicare Other | Attending: Emergency Medicine | Admitting: Emergency Medicine

## 2018-09-08 ENCOUNTER — Other Ambulatory Visit: Payer: Self-pay

## 2018-09-08 ENCOUNTER — Encounter (HOSPITAL_COMMUNITY): Payer: Self-pay | Admitting: *Deleted

## 2018-09-08 DIAGNOSIS — I10 Essential (primary) hypertension: Secondary | ICD-10-CM | POA: Insufficient documentation

## 2018-09-08 DIAGNOSIS — N179 Acute kidney failure, unspecified: Secondary | ICD-10-CM | POA: Insufficient documentation

## 2018-09-08 DIAGNOSIS — I252 Old myocardial infarction: Secondary | ICD-10-CM | POA: Insufficient documentation

## 2018-09-08 DIAGNOSIS — J9811 Atelectasis: Secondary | ICD-10-CM | POA: Diagnosis not present

## 2018-09-08 DIAGNOSIS — Z8674 Personal history of sudden cardiac arrest: Secondary | ICD-10-CM | POA: Insufficient documentation

## 2018-09-08 DIAGNOSIS — Z7982 Long term (current) use of aspirin: Secondary | ICD-10-CM | POA: Diagnosis not present

## 2018-09-08 DIAGNOSIS — R4020 Unspecified coma: Secondary | ICD-10-CM | POA: Diagnosis not present

## 2018-09-08 DIAGNOSIS — Z79899 Other long term (current) drug therapy: Secondary | ICD-10-CM | POA: Diagnosis not present

## 2018-09-08 DIAGNOSIS — R4182 Altered mental status, unspecified: Secondary | ICD-10-CM | POA: Insufficient documentation

## 2018-09-08 DIAGNOSIS — Z1159 Encounter for screening for other viral diseases: Secondary | ICD-10-CM | POA: Diagnosis not present

## 2018-09-08 DIAGNOSIS — I4891 Unspecified atrial fibrillation: Secondary | ICD-10-CM | POA: Insufficient documentation

## 2018-09-08 DIAGNOSIS — R531 Weakness: Secondary | ICD-10-CM | POA: Diagnosis not present

## 2018-09-08 DIAGNOSIS — Z7189 Other specified counseling: Secondary | ICD-10-CM | POA: Diagnosis not present

## 2018-09-08 DIAGNOSIS — M545 Low back pain: Secondary | ICD-10-CM | POA: Diagnosis present

## 2018-09-08 DIAGNOSIS — I959 Hypotension, unspecified: Secondary | ICD-10-CM | POA: Diagnosis not present

## 2018-09-08 LAB — CBC WITH DIFFERENTIAL/PLATELET
Abs Immature Granulocytes: 0.03 10*3/uL (ref 0.00–0.07)
Basophils Absolute: 0 10*3/uL (ref 0.0–0.1)
Basophils Relative: 1 %
Eosinophils Absolute: 0.2 10*3/uL (ref 0.0–0.5)
Eosinophils Relative: 2 %
HCT: 36.8 % — ABNORMAL LOW (ref 39.0–52.0)
Hemoglobin: 12.6 g/dL — ABNORMAL LOW (ref 13.0–17.0)
Immature Granulocytes: 0 %
Lymphocytes Relative: 24 %
Lymphs Abs: 2 10*3/uL (ref 0.7–4.0)
MCH: 31 pg (ref 26.0–34.0)
MCHC: 34.2 g/dL (ref 30.0–36.0)
MCV: 90.4 fL (ref 80.0–100.0)
Monocytes Absolute: 0.7 10*3/uL (ref 0.1–1.0)
Monocytes Relative: 9 %
Neutro Abs: 5.5 10*3/uL (ref 1.7–7.7)
Neutrophils Relative %: 64 %
Platelets: 251 10*3/uL (ref 150–400)
RBC: 4.07 MIL/uL — ABNORMAL LOW (ref 4.22–5.81)
RDW: 12.7 % (ref 11.5–15.5)
WBC: 8.5 10*3/uL (ref 4.0–10.5)
nRBC: 0 % (ref 0.0–0.2)

## 2018-09-08 LAB — URINALYSIS, ROUTINE W REFLEX MICROSCOPIC
Bilirubin Urine: NEGATIVE
Glucose, UA: NEGATIVE mg/dL
Hgb urine dipstick: NEGATIVE
Ketones, ur: NEGATIVE mg/dL
Leukocytes,Ua: NEGATIVE
Nitrite: NEGATIVE
Protein, ur: NEGATIVE mg/dL
Specific Gravity, Urine: 1.011 (ref 1.005–1.030)
pH: 7 (ref 5.0–8.0)

## 2018-09-08 LAB — RAPID URINE DRUG SCREEN, HOSP PERFORMED
Amphetamines: NOT DETECTED
Barbiturates: NOT DETECTED
Benzodiazepines: NOT DETECTED
Cocaine: NOT DETECTED
Opiates: NOT DETECTED
Tetrahydrocannabinol: NOT DETECTED

## 2018-09-08 LAB — COMPREHENSIVE METABOLIC PANEL
ALT: 16 U/L (ref 0–44)
AST: 17 U/L (ref 15–41)
Albumin: 3.7 g/dL (ref 3.5–5.0)
Alkaline Phosphatase: 78 U/L (ref 38–126)
Anion gap: 8 (ref 5–15)
BUN: 21 mg/dL (ref 8–23)
CO2: 27 mmol/L (ref 22–32)
Calcium: 9 mg/dL (ref 8.9–10.3)
Chloride: 99 mmol/L (ref 98–111)
Creatinine, Ser: 1.51 mg/dL — ABNORMAL HIGH (ref 0.61–1.24)
GFR calc Af Amer: 51 mL/min — ABNORMAL LOW (ref >60)
GFR calc non Af Amer: 44 mL/min — ABNORMAL LOW (ref >60)
Glucose, Bld: 107 mg/dL — ABNORMAL HIGH (ref 70–99)
Potassium: 4.9 mmol/L (ref 3.5–5.1)
Sodium: 134 mmol/L — ABNORMAL LOW (ref 135–145)
Total Bilirubin: 0.7 mg/dL (ref 0.3–1.2)
Total Protein: 6.2 g/dL — ABNORMAL LOW (ref 6.5–8.1)

## 2018-09-08 LAB — LACTIC ACID, PLASMA
Lactic Acid, Venous: 1 mmol/L (ref 0.5–1.9)
Lactic Acid, Venous: 0.9 mmol/L (ref 0.5–1.9)

## 2018-09-08 LAB — CBG MONITORING, ED: Glucose-Capillary: 94 mg/dL (ref 70–99)

## 2018-09-08 LAB — TROPONIN I: Troponin I: <0.03 ng/mL (ref <0.03)

## 2018-09-08 LAB — PROCALCITONIN: Procalcitonin: <0.1 ng/mL

## 2018-09-08 LAB — ETHANOL: Alcohol, Ethyl (B): <10 mg/dL (ref <10)

## 2018-09-08 LAB — SARS CORONAVIRUS 2 BY RT PCR (HOSPITAL ORDER, PERFORMED IN ~~LOC~~ HOSPITAL LAB): SARS Coronavirus 2: NEGATIVE

## 2018-09-08 MED ORDER — VANCOMYCIN HCL IN DEXTROSE 1-5 GM/200ML-% IV SOLN: 1000.0000 mg | INTRAVENOUS | Status: DC

## 2018-09-08 MED ORDER — SODIUM CHLORIDE 0.9% FLUSH
3.0000 mL | Freq: Once | INTRAVENOUS | Status: AC
  Administered 2018-09-08: 3 mL via INTRAVENOUS

## 2018-09-08 MED ORDER — SODIUM CHLORIDE 0.9 % IV BOLUS (SEPSIS)
1000.0000 mL | Freq: Once | INTRAVENOUS | Status: AC
  Administered 2018-09-08: 1000 mL via INTRAVENOUS

## 2018-09-08 MED ORDER — VANCOMYCIN HCL 10 G IV SOLR
2000.0000 mg | Freq: Once | INTRAVENOUS | Status: AC
  Administered 2018-09-08: 2000 mg via INTRAVENOUS
  Filled 2018-09-08: qty 2000

## 2018-09-08 MED ORDER — SODIUM CHLORIDE 0.9 % IV SOLN
2.0000 g | Freq: Once | INTRAVENOUS | Status: AC
  Administered 2018-09-08: 2 g via INTRAVENOUS
  Filled 2018-09-08: qty 2

## 2018-09-08 MED ORDER — METRONIDAZOLE IN NACL 5-0.79 MG/ML-% IV SOLN
500.0000 mg | Freq: Once | INTRAVENOUS | Status: AC
  Administered 2018-09-08: 500 mg via INTRAVENOUS
  Filled 2018-09-08: qty 100

## 2018-09-08 MED ORDER — SODIUM CHLORIDE 0.9 % IV SOLN: 2.0000 g | Freq: Two times a day (BID) | INTRAVENOUS | Status: DC

## 2018-09-08 MED ORDER — SODIUM CHLORIDE 0.9 % IV BOLUS (SEPSIS): 500.0000 mL | Freq: Once | INTRAVENOUS | Status: DC

## 2018-09-08 NOTE — ED Triage Notes (Signed)
Pt in c/o continued mid back pain, when asked about this pain pt repeats that "its just a bad back"- pt not answering questions appropriately, reports he had pneumonia but does not remember when, hypotensive in triage

## 2018-09-08 NOTE — ED Notes (Signed)
Gave pt a sandwich bag.

## 2018-09-08 NOTE — ED Notes (Signed)
Pt. Requested to speak to his wife on the phone. Called and pt. Spoke with the wife and seemed happier after conversation.

## 2018-09-08 NOTE — Discharge Instructions (Signed)
Please read and follow all provided instructions.  Your diagnoses today include:  1. AKI (acute kidney injury) ([place])   2. Generalized weakness     Tests performed today include:  Blood counts and electrolytes  Chest x-ray and urine test -no infections  Kidney function test called creatinine, was elevated at 1.5 --this will need to be rechecked by your doctor  Head CT -no signs of stroke or other problems  EKG and cardiac enzymes for the heart -no signs of heart problems  Vital signs. See below for your results today.   Medications prescribed:   None  Take any prescribed medications only as directed.  Home care instructions:  Follow any educational materials contained in this packet.  Please stop taking the atenolol until told to restart by your doctor.  Do not take other blood pressure medications at all if blood pressure is lower than 130/85.   Follow-up instructions: Please follow-up with your primary care provider in the next 2 days for further evaluation of your symptoms.   Return instructions:   Please return to the Emergency Department if you experience worsening symptoms.   Return if you have weakness in your arms or legs, slurred speech, trouble walking or talking, confusion, or trouble with your balance.   Return with fever, shortness of breath, chest pain.  Please return if you have any other emergent concerns.  Additional Information:  Your vital signs today were: BP 108/69    Pulse 64    Temp 97.7 F (36.5 C) (Oral)    Resp 19    Ht 6' (1.829 m)    Wt 106.3 kg    SpO2 100%    BMI 31.78 kg/m  If your blood pressure (BP) was elevated above 135/85 this visit, please have this repeated by your doctor within one month. --------------

## 2018-09-08 NOTE — ED Provider Notes (Signed)
3:33 PM Handoff from [place] PA-C at shift change. Pt presents with increasing confusion over the past several days. Hypotensive on arrival. Previous team spoke with wife. More confused today -- no fevers. No stroke signs. Work-up to this point shows AKI, normal lactate. Pending urine and remainder of labs.   BP (!) 130/48   Pulse 64   Temp 97.7 F (36.5 C) (Oral)   Resp 16   Wt 106.3 kg   SpO2 100%   BMI 31.78 kg/m   3:41 PM Reviewed records. Back pain seems to be a chronic complaint as this was mentioned at hospitalization in Feb for PNA as well as recent neuro visit. Confusion is mild at baseline. Pt still completes ADLs independently and drives occasionally. Reviewed EKG. UA OK.   5:06 PM Went and saw patient. Confused but conversant. Asking to go home.   5:20 PM I spoke with patient's wife -- Audrea Muscat, by telephone.  She has noted a correlation in patient's lethargy with low blood pressure readings.  Symptoms seem to be improved here (mild confusion at baseline) as his blood pressures have been normalized.   Reviewed all results today with wife.  She is comfortable with watching patient at home.  Plan to discharge if orthostatics are okay.  Wife knows that he will need to have creatinine rechecked.  Will stop atenolol and restart if PCP deems this necessary.  Patient had bradycardia on EKG.   6:09 PM Patient did well with orthostatics. No lightheadedness. Plan to d/c to home.   Patient counseled to return if they have weakness in their arms or legs, slurred speech, trouble walking or talking, confusion, trouble with their balance, or if they have any other concerns.  Also encouraged to return with fever, chest pain, shortness of breath.  Patient verbalizes understanding and agrees with plan.            Carlisle Cater, PA-C 09/08/18 1814    Virgel Manifold, MD 09/08/18 Bosie Helper

## 2018-09-08 NOTE — ED Notes (Signed)
Pt. Requested to speak with his wife. Dialed the number and the pt. Pt talked with his wife.  Wife Dois Davenport [phone]

## 2018-09-08 NOTE — Progress Notes (Signed)
Pharmacy Antibiotic Note  Benjamin George is a 78 y.o. male admitted on 09/08/2018 with back pain and AMS.  Pharmacy has been consulted for vancomycin and cefepime dosing for sepsis.  SCr 1.51, CrCL 41 ml/min, afebrile, WBC WNL, LA 1.  Plan: Vanc 2gm IV x 1, then 1000mg  IV Q24H for AUC 534 using SCr 1.51 Cefepime 2gm IV Q12H Monitor renal fxn, clinical progress, vanc AUC   Weight: 234 lb 5.6 oz (106.3 kg)  Temp (24hrs), Avg:97.7 F (36.5 C), Min:97.7 F (36.5 C), Max:97.7 F (36.5 C)  Recent Labs  Lab 09/08/18 1315  WBC 8.5  CREATININE 1.51*  LATICACIDVEN 1.0    Estimated Creatinine Clearance: 50.8 mL/min (A) (by C-G formula based on SCr of 1.51 mg/dL (H)).    Allergies  Allergen Reactions  . Codeine Nausea And Vomiting  . Hydrocodone Nausea And Vomiting  . Aricept [Donepezil Hcl]     Nausea  . Oxycontin [Oxycodone Hcl]     Dizzy and uneasy  . Methocarbamol Nausea Only  . Other Nausea Only    vibramyacin    Vanc 6/1 >> Cefepime 6/1>>  6/1 covid - 6/1 BCx -  6/1 UCx -   Benjamin George D. Mina Marble, PharmD, BCPS, [place] 09/08/2018, 2:40 PM

## 2018-09-08 NOTE — ED Provider Notes (Signed)
[facility] EMERGENCY DEPARTMENT Provider Note   CSN: [id number] Arrival date & time: 09/08/18  1258    History   Chief Complaint Chief Complaint  Patient presents with  . Back Pain    HPI Benjamin George is a 78 y.o. male.     Level 5 caveat due to altered mental status.  The history is provided by the patient and the spouse.     WHIT BRUNI is a 78 y.o. male, with a history of A. fib, heart attack, cardiac arrest, HTN, and hyperlipidemia, presenting to the ED with altered mental status and hypotension.  Patient states he has had lower right back pain for the last 2 days.  He is unable to further detail this.  He says he thinks he has had tingling in the legs, but is not sure.     Past Medical History:  Diagnosis Date  . Arthritis   . Atrial fibrillation ([place])   . Cardiac arrest ([place])   . Complication of anesthesia   . Gout   . Gout   . Heart attack ([place])   . Heart disease   . HLD (hyperlipidemia)   . HTN (hypertension)   . Memory disorder 12/21/2015  . PONV (postoperative nausea and vomiting)   . Sleep apnea    NPSG 10/26/07 - AHI 16.4 used5 yrs, uses CPAP nightly    Patient Active Problem List   Diagnosis Date Noted  . Hypotension   . CAP (community acquired pneumonia) 05/13/2018  . Community acquired pneumonia of left lower lobe of lung ([place]) 05/12/2018  . Ischemic cardiomyopathy 04/18/2017  . Memory disorder 12/21/2015  . Spinal stenosis 05/06/2013  . Palpitations 03/02/2013  . Morbid obesity ([place]) 12/12/2011  . Arthritis of shoulder region, 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  . CAD (coronary artery disease) 10/24/2010  . Preoperative evaluation to rule out surgical contraindication 10/24/2010  . ALLERGIC RHINITIS 05/17/2010  . INSOMNIA 05/17/2010  . Hyperlipidemia LDL goal <70 04/13/2009  . GOUT 04/13/2009  . Obstructive sleep apnea 04/13/2009  . Essential hypertension 04/13/2009  . HEART ATTACK 04/13/2009  . CARDIAC ARREST 04/13/2009    Past Surgical History:  Procedure Laterality Date  . angioplasty    . APPENDECTOMY    . CAROTID STENT    . CARPECTOMY Left 07/22/2017   Procedure: LEFT WRIST HARDWARE REMOVAL AND PROXIMAL ROW CARPECTOMY;  Surgeon: Milly Jakob, MD;  Location: [facility];  Service: Orthopedics;  Laterality: Left;  . CORONARY ANGIOPLASTY WITH STENT PLACEMENT  06/27/2007   L main OK, LAD mild irreg, CFX stent OK, irreg, RCA 60% calcified, EF 45%   . HAND SURGERY     left   . HARDWARE REMOVAL Left 07/22/2017   Procedure: HARDWARE REMOVAL;  Surgeon: Milly Jakob, MD;  Location: [facility];  Service: Orthopedics;  Laterality: Left;  . LUMBAR LAMINECTOMY/DECOMPRESSION MICRODISCECTOMY N/A 05/06/2013   Procedure: Lumbar 4-5 decompression    1 LEVEL;  Surgeon: Sinclair Ship, MD;  Location: [place] [facility];  Service: Orthopedics;  Laterality: N/A;  Lumbar 4-5 decompression  . nasal septopalsty    . ORIF SCAPHOID FRACTURE Left 01/21/2017   Procedure: OPEN TREATMENT OF LEFT SCAPHOID FRACTURE;  Surgeon: Milly Jakob, MD;  Location: [facility];  Service: Orthopedics;  Laterality: Left;  . TOTAL SHOULDER ARTHROPLASTY  04/17/2011   Procedure: TOTAL SHOULDER ARTHROPLASTY; left Surgeon: Nita Sells, MD;  Location: [facility];  Service: Orthopedics;  Laterality: Left;  Home Medications    Prior to Admission medications   Medication Sig Start Date End Date Taking? Authorizing Provider  acetaminophen (TYLENOL) 325 MG tablet Take 2 tablets (650 mg total) by mouth every 6 (six) hours as needed for mild pain or moderate pain. 01/21/17   Milly Jakob, MD  allopurinol (ZYLOPRIM) 100 MG tablet Take 100 mg by mouth every morning.     [provider]  aspirin 81 MG tablet Take 81 mg by mouth daily.    [provider]  atenolol (TENORMIN) 50 MG tablet Take 1 tablet (50 mg total) by mouth daily. 05/20/17   End, Harrell Gave, MD  atorvastatin (LIPITOR)  20 MG tablet TAKE 1 TABLET BY MOUTH EVERY DAY 02/20/17   Larey Dresser, MD  CINNAMON PO Take 1,000 mg by mouth daily.    [provider]  Coenzyme Q10 200 MG capsule Take 200 mg by mouth daily.    [provider]  isosorbide mononitrate (IMDUR) 30 MG 24 hr tablet Take 1 tablet (30 mg total) by mouth daily. 05/19/18   Eugenie Filler, MD  LINZESS 290 MCG CAPS capsule Take 290 mcg by mouth daily before breakfast.  11/29/16   [provider]  losartan-hydrochlorothiazide (HYZAAR) 100-12.5 MG tablet Take 1 tablet by mouth daily. 05/20/18   Eugenie Filler, MD  meclizine (ANTIVERT) 25 MG tablet Take 25 mg by mouth 2 (two) times daily as needed (for vertigo).    [provider]  memantine (NAMENDA) 10 MG tablet Take 1 tablet (10 mg total) by mouth 2 (two) times daily. 08/20/18   Ward Givens, NP  Multiple Vitamin (MULTIVITAMIN) tablet Take 1 tablet by mouth every morning.     [provider]  polyethylene glycol (MIRALAX / GLYCOLAX) packet Take 17 g by mouth daily as needed for mild constipation. 05/17/18   Eugenie Filler, MD  traMADol (ULTRAM) 50 MG tablet Take 1 tablet (50 mg total) by mouth every 6 (six) hours as needed. 05/17/18   Eugenie Filler, MD  vitamin B-12 (CYANOCOBALAMIN) 100 MCG tablet Take 200 mcg by mouth every morning.     [provider]  vitamin C (ASCORBIC ACID) 500 MG tablet Take 500 mg by mouth every morning.    [provider]    Family History Family History  Problem Relation Age of Onset  . Heart attack Father   . Heart disease Father   . Hyperlipidemia Father   . Other Father        [name]  . Alzheimer's disease Mother   . Colon cancer Neg Hx   . Esophageal cancer Neg Hx   . Rectal cancer Neg Hx   . Stomach cancer Neg Hx     Social History Social History   Tobacco Use  . Smoking status: Former Smoker    Packs/day: 1.50    Years: 30.00    Pack years: 45.00    Types: Cigarettes     Last attempt to quit: 04/28/1987    Years since quitting: 31.3  . Smokeless tobacco: Never Used  . Tobacco comment: smoked 2 ppd for 32 years; quit in 1989  Substance Use Topics  . Alcohol use: Yes    Alcohol/week: 16.0 standard drinks    Types: 14 Cans of beer, 2 Shots of liquor per week    Comment: [place] of beers per day  . Drug use: No     Allergies   Codeine; Hydrocodone; Aricept [donepezil hcl]; Oxycontin [oxycodone hcl];  Methocarbamol; and Other   Review of Systems Review of Systems  Unable to perform ROS: Mental status change     Physical Exam Updated Vital Signs BP (!) 83/46 (BP Location: Right Arm)   Pulse 64   Temp 97.7 F (36.5 C) (Oral)   Resp 20   SpO2 100%   Physical Exam Vitals signs and nursing note reviewed.  Constitutional:      General: He is not in acute distress.    Appearance: He is well-developed. He is not diaphoretic.  HENT:     Head: Normocephalic and atraumatic.     Mouth/Throat:     Mouth: Mucous membranes are moist.     Pharynx: Oropharynx is clear.  Eyes:     Conjunctiva/sclera: Conjunctivae normal.  Neck:     Musculoskeletal: Neck supple.  Cardiovascular:     Rate and Rhythm: Normal rate and regular rhythm.     Pulses: Normal pulses.          Radial pulses are 2+ on the right side and 2+ on the left side.       Posterior tibial pulses are 2+ on the right side and 2+ on the left side.     Heart sounds: Normal heart sounds.     Comments: Tactile temperature in the extremities appropriate and equal bilaterally. Pulmonary:     Effort: Pulmonary effort is normal. No respiratory distress.     Breath sounds: Normal breath sounds.  Abdominal:     Palpations: Abdomen is soft.     Tenderness: There is no abdominal tenderness. There is no guarding.  Musculoskeletal:       Back:     Right lower leg: No edema.     Left lower leg: No edema.     Comments: Tenderness in the right lower back, as shown.  His tenderness seems to be in the  area of his spinal stimulator unit.  Lymphadenopathy:     Cervical: No cervical adenopathy.  Skin:    General: Skin is warm and dry.     Capillary Refill: Capillary refill takes less than 2 seconds.  Neurological:     Mental Status: He is alert.     Comments: Oriented to self only.  Patient seems a little slow to respond to questions although he is pleasant. Sensation grossly intact to light touch in the extremities. No noted speech deficits. No aphasia. Patient handles oral secretions without difficulty. No noted swallowing defects.  Equal grip strength bilaterally. No arm drift. Strength 5/5 in the upper extremities. Strength 5/5 in the lower extremities.  Patellar DTRs 2+ bilaterally. Coordination seems to be intact. Cranial nerves III-XII grossly intact.  No noted visual field deficit. No facial droop.   Psychiatric:        Mood and Affect: Mood and affect normal.        Speech: Speech normal.        Behavior: Behavior normal.      ED Treatments / Results  Labs (all labs ordered are listed, but only abnormal results are displayed) Labs Reviewed  COMPREHENSIVE METABOLIC PANEL - Abnormal; Notable for the following components:      Result Value   Sodium 134 (*)    Glucose, Bld 107 (*)    Creatinine, Ser 1.51 (*)    Total Protein 6.2 (*)    GFR calc non Af Amer 44 (*)    GFR calc Af Amer 51 (*)    All other components within normal limits  CBC WITH DIFFERENTIAL/PLATELET - Abnormal; Notable for the following components:   RBC 4.07 (*)    Hemoglobin 12.6 (*)    HCT 36.8 (*)    All other components within normal limits  SARS CORONAVIRUS 2 (HOSPITAL ORDER, PERFORMED IN [facility])  CULTURE, BLOOD (ROUTINE X 2)  CULTURE, BLOOD (ROUTINE X 2)  URINE CULTURE  LACTIC ACID, PLASMA  LACTIC ACID, PLASMA  URINALYSIS, ROUTINE W REFLEX MICROSCOPIC  PROCALCITONIN  TROPONIN I  ETHANOL  RAPID URINE DRUG SCREEN, HOSP PERFORMED  CBG MONITORING, ED    Hemoglobin   Date Value Ref Range Status  09/08/2018 12.6 (L) 13.0 - 17.0 g/dL Final  05/16/2018 13.2 13.0 - 17.0 g/dL Final  05/15/2018 12.6 (L) 13.0 - 17.0 g/dL Final  05/14/2018 12.4 (L) 13.0 - 17.0 g/dL Final    EKG EKG Interpretation  Date/Time:  Monday September 08 2018 13:44:15 EDT Ventricular Rate:  55 PR Interval:    QRS Duration: 110 QT Interval:  446 QTC Calculation: 427 R Axis:   72 Text Interpretation:  Marked sinus bradycardia First degree A-V block Confirmed by Virgel Manifold [phone]) on 09/08/2018 3:13:38 PM   Radiology No results found.  Procedures Procedures (including critical care time)  Medications Ordered in ED Medications  sodium chloride 0.9 % bolus 1,000 mL (0 mLs Intravenous Stopped 09/08/18 1555)    And  sodium chloride 0.9 % bolus 1,000 mL (0 mLs Intravenous Stopped 09/08/18 1554)    And  sodium chloride 0.9 % bolus 500 mL (has no administration in time range)  ceFEPIme (MAXIPIME) 2 g in sodium chloride 0.9 % 100 mL IVPB (has no administration in time range)  vancomycin (VANCOCIN) IVPB 1000 mg/200 mL premix (has no administration in time range)  sodium chloride flush (NS) 0.9 % injection 3 mL (3 mLs Intravenous Given 09/08/18 1403)  metroNIDAZOLE (FLAGYL) IVPB 500 mg (0 mg Intravenous Stopped 09/08/18 1529)  vancomycin (VANCOCIN) 2,000 mg in sodium chloride 0.9 % 500 mL IVPB (2,000 mg Intravenous New Bag/Given 09/08/18 1410)  ceFEPIme (MAXIPIME) 2 g in sodium chloride 0.9 % 100 mL IVPB (0 g Intravenous Stopped 09/08/18 1530)     Initial Impression / Assessment and Plan / ED Course  I have reviewed the triage vital signs and the nursing notes.  Pertinent labs & imaging results that were available during my care of the patient were reviewed by me and considered in my medical decision making (see chart for details).  Clinical Course as of Sep 07 1612  Mon Sep 08, 2018  1340 Dr. Jeanell Sparrow spoke with patient's wife on the phone Dois Davenport, [phone]).  She states patient has  been more confused than normal, generally weak, and with generally decreased activity noted for the last two days.  She also noted his blood pressure lower than normal at 99/60.   [SJ]    Clinical Course User Index [SJ] Maksym Pfiffner C, PA-C       Patient presents with altered mental status.  Initially hypotensive, however, this quickly seemed to resolve.  No focal neuro deficits noted. On chart review, patient was noted to have been evaluated by neurology NP in follow-up visit in telemedicine format on Aug 20, 2018.  At that time, patient was oriented to time, place, and readily and correctly followed commands.  In other words, it does not seem as though patient is confused at baseline.  Findings and plan of care discussed with Pattricia Boss, MD. Dr. Jeanell Sparrow personally evaluated and examined this  patient.  End of shift patient care handoff report given to Alecia Lemming, PA-C. Plan: Follow-up on remaining labs and imaging.  Suspect patient will likely need admission.  Vitals:   09/08/18 1530 09/08/18 1538 09/08/18 1545 09/08/18 1600  BP: 132/62  (!) 151/85 131/63  Pulse:      Resp: 16  14 19   Temp:      TempSrc:      SpO2:      Weight:  106.3 kg    Height:  6' (1.829 m)       Final Clinical Impressions(s) / ED Diagnoses   Final diagnoses:  AKI (acute kidney injury) [facility])    ED Discharge Orders    None       Layla Maw 09/08/18 1617    Pattricia Boss, MD 09/10/18 1216

## 2018-09-08 NOTE — ED Notes (Signed)
Patient transported to X-ray 

## 2018-09-08 NOTE — ED Notes (Signed)
Pt tried to walk out of the room. RN redirected the pt back to the bed.

## 2018-09-08 NOTE — ED Notes (Signed)
RN called Danae Chen the pts wife to come and pick up her husband for dc.

## 2018-09-09 LAB — URINE CULTURE: Culture: NO GROWTH

## 2018-09-10 DIAGNOSIS — K5901 Slow transit constipation: Secondary | ICD-10-CM | POA: Diagnosis not present

## 2018-09-10 DIAGNOSIS — R413 Other amnesia: Secondary | ICD-10-CM | POA: Diagnosis not present

## 2018-09-10 DIAGNOSIS — I2510 Atherosclerotic heart disease of native coronary artery without angina pectoris: Secondary | ICD-10-CM | POA: Diagnosis not present

## 2018-09-10 DIAGNOSIS — M5136 Other intervertebral disc degeneration, lumbar region: Secondary | ICD-10-CM | POA: Diagnosis not present

## 2018-09-10 DIAGNOSIS — I959 Hypotension, unspecified: Secondary | ICD-10-CM | POA: Diagnosis not present

## 2018-09-13 LAB — CULTURE, BLOOD (ROUTINE X 2)
Culture: NO GROWTH
Culture: NO GROWTH

## 2018-09-16 DIAGNOSIS — I959 Hypotension, unspecified: Secondary | ICD-10-CM | POA: Diagnosis not present

## 2018-09-23 DIAGNOSIS — I10 Essential (primary) hypertension: Secondary | ICD-10-CM | POA: Diagnosis not present

## 2018-09-23 DIAGNOSIS — G4733 Obstructive sleep apnea (adult) (pediatric): Secondary | ICD-10-CM | POA: Diagnosis not present

## 2018-10-13 DIAGNOSIS — M5136 Other intervertebral disc degeneration, lumbar region: Secondary | ICD-10-CM | POA: Diagnosis not present

## 2018-10-13 DIAGNOSIS — E119 Type 2 diabetes mellitus without complications: Secondary | ICD-10-CM | POA: Diagnosis not present

## 2018-10-13 DIAGNOSIS — H25013 Cortical age-related cataract, bilateral: Secondary | ICD-10-CM | POA: Diagnosis not present

## 2018-10-13 DIAGNOSIS — H524 Presbyopia: Secondary | ICD-10-CM | POA: Diagnosis not present

## 2018-10-13 DIAGNOSIS — H2513 Age-related nuclear cataract, bilateral: Secondary | ICD-10-CM | POA: Diagnosis not present

## 2018-10-13 DIAGNOSIS — M5137 Other intervertebral disc degeneration, lumbosacral region: Secondary | ICD-10-CM | POA: Diagnosis not present

## 2018-10-13 DIAGNOSIS — M47816 Spondylosis without myelopathy or radiculopathy, lumbar region: Secondary | ICD-10-CM | POA: Diagnosis not present

## 2018-10-13 DIAGNOSIS — G894 Chronic pain syndrome: Secondary | ICD-10-CM | POA: Diagnosis not present

## 2018-12-23 DIAGNOSIS — G4733 Obstructive sleep apnea (adult) (pediatric): Secondary | ICD-10-CM | POA: Diagnosis not present

## 2018-12-23 DIAGNOSIS — I10 Essential (primary) hypertension: Secondary | ICD-10-CM | POA: Diagnosis not present

## 2019-01-12 DIAGNOSIS — R739 Hyperglycemia, unspecified: Secondary | ICD-10-CM | POA: Diagnosis not present

## 2019-01-12 DIAGNOSIS — M109 Gout, unspecified: Secondary | ICD-10-CM | POA: Diagnosis not present

## 2019-01-12 DIAGNOSIS — E7800 Pure hypercholesterolemia, unspecified: Secondary | ICD-10-CM | POA: Diagnosis not present

## 2019-01-12 DIAGNOSIS — I2510 Atherosclerotic heart disease of native coronary artery without angina pectoris: Secondary | ICD-10-CM | POA: Diagnosis not present

## 2019-01-19 DIAGNOSIS — M109 Gout, unspecified: Secondary | ICD-10-CM | POA: Diagnosis not present

## 2019-01-19 DIAGNOSIS — E7800 Pure hypercholesterolemia, unspecified: Secondary | ICD-10-CM | POA: Diagnosis not present

## 2019-01-19 DIAGNOSIS — R739 Hyperglycemia, unspecified: Secondary | ICD-10-CM | POA: Diagnosis not present

## 2019-01-19 DIAGNOSIS — R413 Other amnesia: Secondary | ICD-10-CM | POA: Diagnosis not present

## 2019-01-28 DIAGNOSIS — Z79891 Long term (current) use of opiate analgesic: Secondary | ICD-10-CM | POA: Diagnosis not present

## 2019-01-28 DIAGNOSIS — Z79899 Other long term (current) drug therapy: Secondary | ICD-10-CM | POA: Diagnosis not present

## 2019-01-28 DIAGNOSIS — M47816 Spondylosis without myelopathy or radiculopathy, lumbar region: Secondary | ICD-10-CM | POA: Diagnosis not present

## 2019-01-28 DIAGNOSIS — G894 Chronic pain syndrome: Secondary | ICD-10-CM | POA: Diagnosis not present

## 2019-01-28 DIAGNOSIS — M5137 Other intervertebral disc degeneration, lumbosacral region: Secondary | ICD-10-CM | POA: Diagnosis not present

## 2019-01-28 DIAGNOSIS — M5136 Other intervertebral disc degeneration, lumbar region: Secondary | ICD-10-CM | POA: Diagnosis not present

## 2019-02-23 ENCOUNTER — Ambulatory Visit: Payer: Medicare Other | Admitting: Adult Health

## 2019-02-23 ENCOUNTER — Encounter: Payer: Self-pay | Admitting: Adult Health

## 2019-02-23 ENCOUNTER — Other Ambulatory Visit: Payer: Self-pay

## 2019-02-23 VITALS — BP 115/67 | HR 75 | Temp 97.6°F | Ht 72.0 in | Wt 235.6 lb

## 2019-02-23 DIAGNOSIS — R413 Other amnesia: Secondary | ICD-10-CM | POA: Diagnosis not present

## 2019-02-23 NOTE — Progress Notes (Signed)
PATIENT: Benjamin George DOB: Feb 09, 1941  REASON FOR VISIT: follow up HISTORY FROM: patient  HISTORY OF PRESENT ILLNESS: Today 02/23/19:  Benjamin George is a 78 year old male with a history of memory disturbance.  He returns today for follow-up.  He is currently on Namenda 10 mg twice a day.  He feels that his memory is slightly worse.  He lives at home with his wife.  He is able to complete ADLs independently.  His wife states at times he does need prompting.  His wife now does all the finances.  She also keeps up with all the housework.  He denies any trouble sleeping.  Denies hallucinations.  He was on Aricept in the past but it caused nausea.  He returns today for an evaluation.  HISTORY 08/20/18:  Benjamin George is a 78 year old male with a history of memory disturbance.  He returns today for a virtual visit.  He is with his wife.  He reports that his memory may be slightly worse.  He reports that he is able to complete all ADLs independently.  His wife reports that he does not do much driving other than going to familiar places like [employer].  He denies any trouble sleeping.  Reports good appetite.  No change in mood or behavior.  No hallucinations.  He manages his own medications.  His wife manages their finances.  He uses a walker when ambulating.  He still reports back pain.  In the past he has had a spinal cord stimulator placed.  He joins me today for virtual visit.  REVIEW OF SYSTEMS: Out of a complete 14 system review of symptoms, the patient complains only of the following symptoms, and all other reviewed systems are negative.  See HPI  ALLERGIES: Allergies  Allergen Reactions  . Codeine Nausea And Vomiting  . Hydrocodone Nausea And Vomiting  . Aricept [Donepezil Hcl]     Nausea  . Oxycontin [Oxycodone Hcl]     Dizzy and uneasy  . Methocarbamol Nausea Only  . Other Nausea Only    vibramyacin    HOME MEDICATIONS: Outpatient Medications Prior to Visit  Medication Sig  Dispense Refill  . aspirin 81 MG tablet Take 81 mg by mouth daily.    Marland Kitchen atenolol (TENORMIN) 50 MG tablet Take 1 tablet (50 mg total) by mouth daily. 90 tablet 3  . Coenzyme Q10 200 MG capsule Take 200 mg by mouth daily.    Marland Kitchen LINZESS 290 MCG CAPS capsule Take 290 mcg by mouth daily before breakfast.     . losartan-hydrochlorothiazide (HYZAAR) 100-12.5 MG tablet Take 1 tablet by mouth daily.    . meclizine (ANTIVERT) 25 MG tablet Take 25 mg by mouth 2 (two) times daily as needed (for vertigo).    . memantine (NAMENDA) 10 MG tablet Take 1 tablet (10 mg total) by mouth 2 (two) times daily. 180 tablet 3  . Multiple Vitamin (MULTIVITAMIN) tablet Take 1 tablet by mouth every morning.     . traMADol (ULTRAM) 50 MG tablet Take 1 tablet (50 mg total) by mouth every 6 (six) hours as needed. (Patient taking differently: Take 50 mg by mouth as needed. ) 20 tablet 0  . vitamin B-12 (CYANOCOBALAMIN) 100 MCG tablet Take 200 mcg by mouth every morning.     Marland Kitchen allopurinol (ZYLOPRIM) 100 MG tablet Take 100 mg by mouth every morning.     Marland Kitchen atorvastatin (LIPITOR) 20 MG tablet TAKE 1 TABLET BY MOUTH EVERY DAY (Patient not taking: Reported  on 02/23/2019) 90 tablet 3  . acetaminophen (TYLENOL) 325 MG tablet Take 2 tablets (650 mg total) by mouth every 6 (six) hours as needed for mild pain or moderate pain. (Patient not taking: Reported on 02/23/2019)    . CINNAMON PO Take 1,000 mg by mouth daily.    . isosorbide mononitrate (IMDUR) 30 MG 24 hr tablet Take 1 tablet (30 mg total) by mouth daily. 90 tablet 0  . polyethylene glycol (MIRALAX / GLYCOLAX) packet Take 17 g by mouth daily as needed for mild constipation. 14 each 0  . vitamin C (ASCORBIC ACID) 500 MG tablet Take 500 mg by mouth every morning.     No facility-administered medications prior to visit.     PAST MEDICAL HISTORY: Past Medical History:  Diagnosis Date  . Arthritis   . Atrial fibrillation ([place])   . Cardiac arrest ([place])   . Complication of  anesthesia   . Gout   . Gout   . Heart attack ([place])   . Heart disease   . HLD (hyperlipidemia)   . HTN (hypertension)   . Memory disorder 12/21/2015  . PONV (postoperative nausea and vomiting)   . Sleep apnea    NPSG 10/26/07 - AHI 16.4 used5 yrs, uses CPAP nightly    PAST SURGICAL HISTORY: Past Surgical History:  Procedure Laterality Date  . angioplasty    . APPENDECTOMY    . CAROTID STENT    . CARPECTOMY Left 07/22/2017   Procedure: LEFT WRIST HARDWARE REMOVAL AND PROXIMAL ROW CARPECTOMY;  Surgeon: Milly Jakob, MD;  Location: [facility];  Service: Orthopedics;  Laterality: Left;  . CORONARY ANGIOPLASTY WITH STENT PLACEMENT  06/27/2007   L main OK, LAD mild irreg, CFX stent OK, irreg, RCA 60% calcified, EF 45%   . HAND SURGERY     left   . HARDWARE REMOVAL Left 07/22/2017   Procedure: HARDWARE REMOVAL;  Surgeon: Milly Jakob, MD;  Location: [facility];  Service: Orthopedics;  Laterality: Left;  . LUMBAR LAMINECTOMY/DECOMPRESSION MICRODISCECTOMY N/A 05/06/2013   Procedure: Lumbar 4-5 decompression    1 LEVEL;  Surgeon: Sinclair Ship, MD;  Location: [facility];  Service: Orthopedics;  Laterality: N/A;  Lumbar 4-5 decompression  . nasal septopalsty    . ORIF SCAPHOID FRACTURE Left 01/21/2017   Procedure: OPEN TREATMENT OF LEFT SCAPHOID FRACTURE;  Surgeon: Milly Jakob, MD;  Location: [place];  Service: Orthopedics;  Laterality: Left;  . TOTAL SHOULDER ARTHROPLASTY  04/17/2011   Procedure: TOTAL SHOULDER ARTHROPLASTY; left Surgeon: Nita Sells, MD;  Location: [place] [facility];  Service: Orthopedics;  Laterality: Left;    FAMILY HISTORY: Family History  Problem Relation Age of Onset  . Heart attack Father   . Heart disease Father   . Hyperlipidemia Father   . Other Father        [name]  . Alzheimer's disease Mother   . Colon cancer Neg Hx   . Esophageal cancer Neg Hx   . Rectal cancer Neg Hx   . Stomach cancer  Neg Hx     SOCIAL HISTORY: Social History   Socioeconomic History  . Marital status: Married    Spouse name: Not on file  . Number of children: 2  . Years of education: College/tech  . Highest education level: Not on file  Occupational History  . Occupation: Retired  Scientific laboratory technician  . Financial resource strain: Not hard at all  . Food insecurity    Worry: Patient  refused    Inability: Patient refused  . Transportation needs    Medical: Patient refused    Non-medical: Patient refused  Tobacco Use  . Smoking status: Former Smoker    Packs/day: 1.50    Years: 30.00    Pack years: 45.00    Types: Cigarettes    Quit date: 04/28/1987    Years since quitting: 31.8  . Smokeless tobacco: Never Used  . Tobacco comment: smoked 2 ppd for 32 years; quit in 1989  Substance and Sexual Activity  . Alcohol use: Yes    Alcohol/week: 16.0 standard drinks    Types: 14 Cans of beer, 2 Shots of liquor per week    Comment: [place] of beers per day  . Drug use: No  . Sexual activity: Not on file    Comment: Married  Lifestyle  . Physical activity    Days per week: Not on file    Minutes per session: Not on file  . Stress: To some extent  Relationships  . Social Herbalist on phone: More than three times a week    Gets together: Never    Attends religious service: Never    Active member of club or organization: Yes    Attends meetings of clubs or organizations: More than 4 times per year    Relationship status: Married  . Intimate partner violence    Fear of current or ex partner: Patient refused    Emotionally abused: Patient refused    Physically abused: Patient refused    Forced sexual activity: Patient refused  Other Topics Concern  . Not on file  Social History Narrative   Married, 2 children.    Retired VP at [employer]:   02/23/19 1358  BP: 115/67  Pulse: 75  Temp: 97.6 F (36.4 C)  TempSrc: Oral  Weight: 235 lb 9.6 oz (106.9  kg)  Height: 6' (1.829 m)   Body mass index is 31.95 kg/m.   MMSE - Mini Mental State Exam 02/23/2019 08/20/2018 12/12/2017  Not completed: (No Data) - -  Orientation to time 3 2 0  Orientation to Place 3 5 5   Registration 3 3 3   Attention/ Calculation 5 5 5   Recall 2 0 2  Language- name 2 objects 1 2 2   Language- repeat 1 1 0  Language- follow 3 step command 3 3 3   Language- read & follow direction 1 - 1  Write a sentence 0 - 1  Copy design 0 - 0  Total score 22 - 22     Generalized: Well developed, in no acute distress   Neurological examination  Mentation: Alert oriented to time, place, history taking. Follows all commands speech and language fluent Cranial nerve II-XII: Pupils were equal round reactive to light. Extraocular movements were full, visual field were full on confrontational test.  Head turning and shoulder shrug  were normal and symmetric. Motor: The motor testing reveals 5 over 5 strength of all 4 extremities. Good symmetric motor tone is noted throughout.  Sensory: Sensory testing is intact to soft touch on all 4 extremities. No evidence of extinction is noted.  Coordination: Cerebellar testing reveals good finger-nose-finger and heel-to-shin bilaterally.  Gait and station: Patient uses a cane when ambulating.  Gait is slightly unsteady.   DIAGNOSTIC DATA (LABS, IMAGING, TESTING) - I reviewed patient records, labs, notes, testing and imaging myself where available.  Lab Results  Component Value  Date   WBC 8.5 09/08/2018   HGB 12.6 (L) 09/08/2018   HCT 36.8 (L) 09/08/2018   MCV 90.4 09/08/2018   PLT 251 09/08/2018      Component Value Date/Time   NA 134 (L) 09/08/2018 1315   K 4.9 09/08/2018 1315   CL 99 09/08/2018 1315   CO2 27 09/08/2018 1315   GLUCOSE 107 (H) 09/08/2018 1315   BUN 21 09/08/2018 1315   CREATININE 1.51 (H) 09/08/2018 1315   CREATININE 1.35 (H) 03/16/2016 1653   CALCIUM 9.0 09/08/2018 1315   PROT 6.2 (L) 09/08/2018 1315   ALBUMIN  3.7 09/08/2018 1315   AST 17 09/08/2018 1315   ALT 16 09/08/2018 1315   ALKPHOS 78 09/08/2018 1315   BILITOT 0.7 09/08/2018 1315   GFRNONAA 44 (L) 09/08/2018 1315   GFRAA 51 (L) 09/08/2018 1315   Lab Results  Component Value Date   CHOL 155 03/16/2016   HDL 45 03/16/2016   LDLCALC 54 03/16/2016   TRIG 279 (H) 03/16/2016   CHOLHDL 3.4 03/16/2016   No results found for: HGBA1C No results found for: VITAMINB12 Lab Results  Component Value Date   TSH 0.853 05/13/2018      ASSESSMENT AND PLAN 78 y.o. year old male  has a past medical history of Arthritis, Atrial fibrillation ([place]), Cardiac arrest ([place]), Complication of anesthesia, Gout, Gout, Heart attack ([place]), Heart disease, HLD (hyperlipidemia), HTN (hypertension), Memory disorder (12/21/2015), PONV (postoperative nausea and vomiting), and Sleep apnea. here with :  1.  Memory disturbance  - Memory score has remained stable MMSE 22/30 - Continue Namenda 10 mg twice a day  I have advised the patient that if his symptoms worsen or he develops new symptoms he should let us know.  He will follow-up in 6 months or sooner if needed.   I spent 15 minutes with the patient. 50% of this time was spent reviewing plan of care   Ward Givens, MSN, NP-C 02/23/2019, 2:05 PM [facility] [address] [place],  [address] [phone]

## 2019-02-23 NOTE — Patient Instructions (Signed)
Your Plan:  Continue to monitor memory Memory score stable 22/30 Continue namenda If your symptoms worsen or you develop new symptoms please let us know.       Thank you for coming to see [place] at [facility]. I hope we have been able to provide you high quality care today.  You may receive a patient satisfaction survey over the next few weeks. We would appreciate your feedback and comments so that we may continue to improve ourselves and the health of our patients.

## 2019-02-24 NOTE — Progress Notes (Signed)
I have read the note, and I agree with the clinical assessment and plan.  Careen Mauch K Kaisyn Millea   

## 2019-02-28 ENCOUNTER — Other Ambulatory Visit: Payer: Self-pay

## 2019-02-28 DIAGNOSIS — Z20822 Contact with and (suspected) exposure to covid-19: Secondary | ICD-10-CM

## 2019-03-02 LAB — NOVEL CORONAVIRUS, NAA: SARS-CoV-2, NAA: NOT DETECTED

## 2019-03-03 ENCOUNTER — Telehealth: Payer: Self-pay | Admitting: Internal Medicine

## 2019-03-03 ENCOUNTER — Telehealth: Payer: Self-pay | Admitting: Adult Health

## 2019-03-03 NOTE — Telephone Encounter (Signed)
Patient wife called in and received his covid test result

## 2019-03-03 NOTE — Telephone Encounter (Signed)
I called and spoke to wife of pt.  She was not aware that he called.  Dr. Baird Lyons is the provider that he has seen before. For OSA and cpap.  I gave her the # to [facility].  This may not be the right #.  She would call and see.

## 2019-03-03 NOTE — Telephone Encounter (Signed)
Patient called stating that his cpap machine is not working in terms of t the straps for the mask. Patient states [facility] to contact neurology office. Please follow up  CB#[phone]

## 2019-03-16 ENCOUNTER — Other Ambulatory Visit: Payer: Self-pay

## 2019-03-16 DIAGNOSIS — Z20822 Contact with and (suspected) exposure to covid-19: Secondary | ICD-10-CM

## 2019-03-17 DIAGNOSIS — G4733 Obstructive sleep apnea (adult) (pediatric): Secondary | ICD-10-CM | POA: Diagnosis not present

## 2019-03-17 DIAGNOSIS — I10 Essential (primary) hypertension: Secondary | ICD-10-CM | POA: Diagnosis not present

## 2019-03-17 LAB — NOVEL CORONAVIRUS, NAA: SARS-CoV-2, NAA: NOT DETECTED

## 2019-04-13 DIAGNOSIS — G4733 Obstructive sleep apnea (adult) (pediatric): Secondary | ICD-10-CM | POA: Diagnosis not present

## 2019-04-13 DIAGNOSIS — I10 Essential (primary) hypertension: Secondary | ICD-10-CM | POA: Diagnosis not present

## 2019-04-13 DIAGNOSIS — E7800 Pure hypercholesterolemia, unspecified: Secondary | ICD-10-CM | POA: Diagnosis not present

## 2019-04-13 DIAGNOSIS — R739 Hyperglycemia, unspecified: Secondary | ICD-10-CM | POA: Diagnosis not present

## 2019-04-20 DIAGNOSIS — E7800 Pure hypercholesterolemia, unspecified: Secondary | ICD-10-CM | POA: Diagnosis not present

## 2019-04-20 DIAGNOSIS — K5900 Constipation, unspecified: Secondary | ICD-10-CM | POA: Diagnosis not present

## 2019-04-20 DIAGNOSIS — R7303 Prediabetes: Secondary | ICD-10-CM | POA: Diagnosis not present

## 2019-04-20 DIAGNOSIS — G4733 Obstructive sleep apnea (adult) (pediatric): Secondary | ICD-10-CM | POA: Diagnosis not present

## 2019-04-20 DIAGNOSIS — I10 Essential (primary) hypertension: Secondary | ICD-10-CM | POA: Diagnosis not present

## 2019-04-20 DIAGNOSIS — R413 Other amnesia: Secondary | ICD-10-CM | POA: Diagnosis not present

## 2019-04-20 DIAGNOSIS — I2510 Atherosclerotic heart disease of native coronary artery without angina pectoris: Secondary | ICD-10-CM | POA: Diagnosis not present

## 2019-05-07 ENCOUNTER — Other Ambulatory Visit: Payer: Self-pay

## 2019-05-07 ENCOUNTER — Ambulatory Visit: Payer: Medicare Other | Admitting: Internal Medicine

## 2019-05-07 ENCOUNTER — Encounter: Payer: Self-pay | Admitting: Internal Medicine

## 2019-05-07 VITALS — BP 106/68 | HR 80 | Temp 97.3°F | Ht 72.0 in | Wt 232.0 lb

## 2019-05-07 DIAGNOSIS — M4800 Spinal stenosis, site unspecified: Secondary | ICD-10-CM | POA: Diagnosis not present

## 2019-05-07 DIAGNOSIS — R413 Other amnesia: Secondary | ICD-10-CM | POA: Diagnosis not present

## 2019-05-07 DIAGNOSIS — J3089 Other allergic rhinitis: Secondary | ICD-10-CM

## 2019-05-07 DIAGNOSIS — G4733 Obstructive sleep apnea (adult) (pediatric): Secondary | ICD-10-CM

## 2019-05-07 DIAGNOSIS — J302 Other seasonal allergic rhinitis: Secondary | ICD-10-CM | POA: Diagnosis not present

## 2019-05-07 NOTE — Patient Instructions (Signed)
Order- DME [facility] please replace old CPAP machine if eligible. Continue auto 5-15, mask of choice, humidifier, supplies, Airview/ card  Please call if we can help

## 2019-05-07 NOTE — Progress Notes (Signed)
HPI male former smoker followed for management of OSA, complicated by allergic rhinitis, CAD/ MI, morbid obesity, insomnia NPSG-10/16/2007-AHI 16.4/hour, desaturation to 83%. Periodic limb movement with arousal 8.2/hour  ---------------------------------------------------------------------------------  05/06/2018- 79 year old male former smoker followed for management of OSA, complicated by allergic rhinitis, CAD/ MI, morbid obesity, insomnia CPAP auto 5-15/[facility] -----OSA-good with CPAP Download 93% compliance AHI 3.4/hour. "I would try to sleep without CPAP now".  He denies discomfort or concerns.  We discussed pressure settings. I asked about his past smoking history.  He admits occasional cough and wheeze.  Activity now limited by back and leg pain-using cane.  Some dyspnea with ADLs.  He agrees to PFT and says his PCP has checked CXR. He is not sure if he is facing more back surgery.  Says cardiac status is stable.  Expresses concerns about his memory.  05/07/19- 79 year old male former smoker followed for management of OSA, complicated by allergic rhinitis, CAD/ MI, morbid obesity, insomnia CPAP auto 5-15/[facility] for [facility], then ED for confusion with hypotension Wife with him today. CT head 09/08/2018- probable nasal polyposis, also atrophy and vascular changes Download compliance 97%, AHI 2.5/ hr Body weight today 232 lbs Wife says he is doing fine with CPAP. Machine may be old- will replace if eligible Wife notes dx dementia now CXR 09/08/2018-  Atelectasis in the left base.  No other acute abnormalities.  Prior to Admission medications   Medication Sig Start Date End Date Taking? Authorizing Provider  allopurinol (ZYLOPRIM) 100 MG tablet Take 100 mg by mouth every morning.    Yes [provider]  aspirin 81 MG tablet Take 81 mg by mouth daily.   Yes [provider]  atenolol (TENORMIN) 50 MG tablet Take 1 tablet (50 mg total) by mouth daily. 05/20/17  Yes  End, Harrell Gave, MD  atorvastatin (LIPITOR) 20 MG tablet TAKE 1 TABLET BY MOUTH EVERY DAY 02/20/17  Yes Larey Dresser, MD  Coenzyme Q10 200 MG capsule Take 200 mg by mouth daily.   Yes [provider]  FLUZONE HIGH-DOSE QUADRIVALENT 0.7 ML SUSY  12/03/18  Yes [provider]  meclizine (ANTIVERT) 25 MG tablet Take 25 mg by mouth 2 (two) times daily as needed (for vertigo).   Yes [provider]  memantine (NAMENDA) 10 MG tablet Take 1 tablet (10 mg total) by mouth 2 (two) times daily. 08/20/18  Yes Ward Givens, NP  Multiple Vitamin (MULTIVITAMIN) tablet Take 1 tablet by mouth every morning.    Yes [provider]  traMADol (ULTRAM) 50 MG tablet Take 1 tablet (50 mg total) by mouth every 6 (six) hours as needed. Patient taking differently: Take 50 mg by mouth as needed.  05/17/18  Yes Eugenie Filler, MD  vitamin B-12 (CYANOCOBALAMIN) 100 MCG tablet Take 200 mcg by mouth every morning.    Yes [provider]   Past Medical History:  Diagnosis Date  . Arthritis   . Atrial fibrillation ([place])   . Cardiac arrest ([place])   . Complication of anesthesia   . Gout   . Gout   . Heart attack ([place])   . Heart disease   . HLD (hyperlipidemia)   . HTN (hypertension)   . Memory disorder 12/21/2015  . PONV (postoperative nausea and vomiting)   . Sleep apnea    NPSG 10/26/07 - AHI 16.4 used5 yrs, uses CPAP nightly   Past Surgical History:  Procedure Laterality Date  . angioplasty    . APPENDECTOMY    .  CAROTID STENT    . CARPECTOMY Left 07/22/2017   Procedure: LEFT WRIST HARDWARE REMOVAL AND PROXIMAL ROW CARPECTOMY;  Surgeon: Milly Jakob, MD;  Location: [place];  Service: Orthopedics;  Laterality: Left;  . CORONARY ANGIOPLASTY WITH STENT PLACEMENT  06/27/2007   L main OK, LAD mild irreg, CFX stent OK, irreg, RCA 60% calcified, EF 45%   . HAND SURGERY     left   . HARDWARE REMOVAL Left 07/22/2017   Procedure: HARDWARE REMOVAL;   Surgeon: Milly Jakob, MD;  Location: [place];  Service: Orthopedics;  Laterality: Left;  . LUMBAR LAMINECTOMY/DECOMPRESSION MICRODISCECTOMY N/A 05/06/2013   Procedure: Lumbar 4-5 decompression    1 LEVEL;  Surgeon: Sinclair Ship, MD;  Location: [place];  Service: Orthopedics;  Laterality: N/A;  Lumbar 4-5 decompression  . nasal septopalsty    . ORIF SCAPHOID FRACTURE Left 01/21/2017   Procedure: OPEN TREATMENT OF LEFT SCAPHOID FRACTURE;  Surgeon: Milly Jakob, MD;  Location: [place];  Service: Orthopedics;  Laterality: Left;  . TOTAL SHOULDER ARTHROPLASTY  04/17/2011   Procedure: TOTAL SHOULDER ARTHROPLASTY; left Surgeon: Nita [place]ells, MD;  Location: [place];  Service: Orthopedics;  Laterality: Left;   Family History  Problem Relation Age of Onset  . Heart attack Father   . Heart disease Father   . Hyperlipidemia Father   . Other Father        [name]  . Alzheimer's disease Mother   . Colon cancer Neg Hx   . Esophageal cancer Neg Hx   . Rectal cancer Neg Hx   . Stomach cancer Neg Hx    Social History   Socioeconomic History  . Marital status: Married    Spouse name: Not on file  . Number of children: 2  . Years of education: College/tech  . Highest education level: Not on file  Occupational History  . Occupation: Retired  Tobacco Use  . Smoking status: Former Smoker    Packs/day: 1.50    Years: 30.00    Pack years: 45.00    Types: Cigarettes    Quit date: 04/28/1987    Years since quitting: 32.0  . Smokeless tobacco: Never Used  . Tobacco comment: smoked 2 ppd for 32 years; quit in 1989  Substance and Sexual Activity  . Alcohol use: Yes    Alcohol/week: 16.0 standard drinks    Types: 14 Cans of beer, 2 Shots of liquor per week    Comment: Coulpe of beers per day  . Drug use: No  . Sexual activity: Not on file    Comment: Married  Other Topics Concern  . Not on file  Social History Narrative   Married, 2  children.    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 Difficulty of Paying Living Expenses: Not hard at all  Food Insecurity: Unknown  . Worried About Charity fundraiser in the Last Year: Patient refused  . Ran Out of Food in the Last Year: Patient refused  Transportation Needs: Unknown  . Lack of Transportation (Medical): Patient refused  . Lack of Transportation (Non-Medical): Patient refused  Physical Activity:   . Days of Exercise per Week: Not on file  . Minutes of Exercise per Session: Not on file  Stress: Stress Concern Present  . Feeling of Stress : To some extent  Social Connections: Slightly Isolated  . Frequency of Communication  with Friends and Family: More than three times a week  . Frequency of Social Gatherings with Friends and Family: Never  . Attends Religious Services: Never  . Active Member of Clubs or Organizations: Yes  . Attends Archivist Meetings: More than 4 times per year  . Marital Status: Married  Human resources officer Violence: Unknown  . Fear of Current or Ex-Partner: Patient refused  . Emotionally Abused: Patient refused  . Physically Abused: Patient refused  . Sexually Abused: Patient refused   ROS-see HPI   + = positive Constitutional:    weight loss, night sweats, fevers, chills, fatigue, lassitude. HEENT:    headaches, difficulty swallowing, tooth/dental problems, sore throat,       sneezing, itching, +ear ache, nasal congestion, post nasal drip, snoring CV:    chest pain, orthopnea, PND, swelling in lower extremities, anasarca,                                  dizziness, +palpitations Resp:   +shortness of breath with exertion or at rest.                productive cough,   +non-productive cough, coughing up of blood.              change in color of mucus.  wheezing.   Skin:    rash or lesions. GI:  No-   heartburn, indigestion, abdominal pain, nausea, vomiting, diarrhea,                  change in bowel habits, loss of appetite GU: dysuria, change in color of urine, no urgency or frequency.   flank pain. MS:   joint pain, stiffness, decreased range of motion, back pain. Neuro-     nothing unusual Psych:  change in mood or affect.  depression or anxiety.   +memory loss.  OBJ- Physical Exam General- Alert, Oriented, Affect-appropriate, Distress- none acute, + obese Skin- rash-none, lesions- none, excoriation- none Lymphadenopathy- none Head- atraumatic            Eyes- Gross vision intact, PERRLA, conjunctivae and secretions clear            Ears- Hearing, canals-normal            Nose- Clear, no-Septal dev, mucus, polyps, erosion, perforation             Throat- Mallampati III , mucosa clear , drainage- none, tonsils- atrophic Neck- flexible , trachea midline, no stridor , thyroid nl, carotid no bruit Chest - symmetrical excursion , unlabored           Heart/CV- RRR , no murmur , no gallop  , no rub, nl s1 s2                           - JVD- none , edema- none, stasis changes- none, varices- none           Lung- clear to P&A, wheeze- none, cough- none , dullness-none, rub- none           Chest wall-  Abd-  Br/ Gen/ Rectal- Not done, not indicated Extrem- cyanosis- none, clubbing, none, atrophy- none, strength- nl Neuro- + halting speech and word searching

## 2019-05-08 NOTE — Assessment & Plan Note (Signed)
Benefits from CPAP with good compliance and control Plan- replace old machine if eligible, continue auto 5-15

## 2019-05-08 NOTE — Assessment & Plan Note (Signed)
CT had indicated possible nasal polyps.  He denies difficulty breathing through his nose. Can emphasize nasal steroid spray if necessary.

## 2019-05-08 NOTE — Assessment & Plan Note (Signed)
Back pain does impact sleep, but apparently not significant issue at this time.

## 2019-05-08 NOTE — Assessment & Plan Note (Signed)
Wife is taking over for him and I suspect she makes sure he wears his CPAP.

## 2019-05-09 ENCOUNTER — Ambulatory Visit: Payer: Medicare Other

## 2019-05-15 ENCOUNTER — Other Ambulatory Visit: Payer: Self-pay

## 2019-05-15 ENCOUNTER — Encounter: Payer: Self-pay | Admitting: Cardiology

## 2019-05-15 ENCOUNTER — Ambulatory Visit: Payer: Medicare Other | Admitting: Cardiology

## 2019-05-15 VITALS — BP 80/50 | HR 80 | Ht 72.0 in | Wt 236.0 lb

## 2019-05-15 DIAGNOSIS — I255 Ischemic cardiomyopathy: Secondary | ICD-10-CM

## 2019-05-15 DIAGNOSIS — E785 Hyperlipidemia, unspecified: Secondary | ICD-10-CM

## 2019-05-15 DIAGNOSIS — I25118 Atherosclerotic heart disease of native coronary artery with other forms of angina pectoris: Secondary | ICD-10-CM | POA: Diagnosis not present

## 2019-05-15 NOTE — Progress Notes (Signed)
Cardiology Office Note:    Date:  05/15/2019   ID:  Benjamin George, DOB 01/18/1941, MRN [id number]  PCP:  Benjamin Gravel, MD  Cardiologist:  Benjamin Furbish, MD  Electrophysiologist:  None   Referring MD: Benjamin Gravel, MD     History of Present Illness:    Benjamin George is a 79 y.o. male former patient of Benjamin George here for follow-up of coronary artery disease status post PCI's to the RCA and circumflex with normalization of EF 65% in 2012, hypertension, obesity, obstructive sleep apnea.  Used to see Benjamin George as well as Benjamin George.  Doing well.  Some bilateral thigh pain when walking previously.  Low back pain, left arm pain.  Fractured his left wrist previously.  Has experienced in the past rare sharp shooting pains across his chest lasting 1 second duration.     Married, Benjamin George. he is retired from [employer], moved from [place].  They are considering at some point potentially moving to [facility] to be with family.  Overall doing quite well.  Enjoys the [place].  Used to go to [place] sometimes to see baseball games with 3 other individuals.  05/15/2019-here for the follow-up of coronary artery disease hypertension obstructive sleep apnea.  Came in wheelchair.  Difficult gait.  Been tested twice for Covid, negative in November and December 2020.  Blood pressure currently low.  80/50.  See below for details.   Past Medical History:  Diagnosis Date  . Arthritis   . Atrial fibrillation ([place])   . Cardiac arrest ([place])   . Complication of anesthesia   . Gout   . Gout   . Heart attack ()   . Heart disease   . HLD (hyperlipidemia)   . HTN (hypertension)   . Memory disorder 12/21/2015  . PONV (postoperative nausea and vomiting)   . Sleep apnea    NPSG 10/26/07 - AHI 16.4 used5 yrs, uses CPAP nightly    Past Surgical History:  Procedure Laterality Date  . angioplasty    . APPENDECTOMY    . CAROTID STENT    . CARPECTOMY Left 07/22/2017   Procedure: LEFT WRIST HARDWARE  REMOVAL AND PROXIMAL ROW CARPECTOMY;  Surgeon: Milly Jakob, MD;  Location: [place];  Service: Orthopedics;  Laterality: Left;  . CORONARY ANGIOPLASTY WITH STENT PLACEMENT  06/27/2007   L main OK, LAD mild irreg, CFX stent OK, irreg, RCA 60% calcified, EF 45%   . HAND SURGERY     left   . HARDWARE REMOVAL Left 07/22/2017   Procedure: HARDWARE REMOVAL;  Surgeon: Milly Jakob, MD;  Location: [place];  Service: Orthopedics;  Laterality: Left;  . LUMBAR LAMINECTOMY/DECOMPRESSION MICRODISCECTOMY N/A 05/06/2013   Procedure: Lumbar 4-5 decompression    1 LEVEL;  Surgeon: Sinclair Ship, MD;  Location: [place];  Service: Orthopedics;  Laterality: N/A;  Lumbar 4-5 decompression  . nasal septopalsty    . ORIF SCAPHOID FRACTURE Left 01/21/2017   Procedure: OPEN TREATMENT OF LEFT SCAPHOID FRACTURE;  Surgeon: Milly Jakob, MD;  Location: [place];  Service: Orthopedics;  Laterality: Left;  . TOTAL SHOULDER ARTHROPLASTY  04/17/2011   Procedure: TOTAL SHOULDER ARTHROPLASTY; left Surgeon: Nita Sells, MD;  Location: [facility];  Service: Orthopedics;  Laterality: Left;    Current Medications: Current Meds  Medication Sig  . aspirin 81 MG tablet Take 81 mg by mouth daily.  Marland Kitchen atorvastatin (LIPITOR) 20 MG tablet TAKE 1 TABLET BY  MOUTH EVERY DAY  . FLUZONE HIGH-DOSE QUADRIVALENT 0.7 ML SUSY   . memantine (NAMENDA) 10 MG tablet Take 1 tablet (10 mg total) by mouth 2 (two) times daily.  . Multiple Vitamin (MULTIVITAMIN) tablet Take 1 tablet by mouth every morning.   . traMADol (ULTRAM) 50 MG tablet Take 1 tablet (50 mg total) by mouth every 6 (six) hours as needed.     Allergies:   Codeine, Hydrocodone, Aricept [donepezil hcl], Oxycontin [oxycodone hcl], Methocarbamol, and Other   Social History   Socioeconomic History  . Marital status: Married    Spouse name: Not on file  . Number of children: 2  . Years of education: College/tech    . Highest education level: Not on file  Occupational History  . Occupation: Retired  Tobacco Use  . Smoking status: Former Smoker    Packs/day: 1.50    Years: 30.00    Pack years: 45.00    Types: Cigarettes    Quit date: 04/28/1987    Years since quitting: 32.0  . Smokeless tobacco: Never Used  . Tobacco comment: smoked 2 ppd for 32 years; quit in 1989  Substance and Sexual Activity  . Alcohol use: Yes    Alcohol/week: 16.0 standard drinks    Types: 14 Cans of beer, 2 Shots of liquor per week    Comment: Coulpe of beers per day  . Drug use: No  . Sexual activity: Not on file    Comment: Married  Other Topics Concern  . Not on file  Social History Narrative   Married, 2 children.    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 Difficulty of Paying Living Expenses: Not on file  Food Insecurity:   . Worried About Charity fundraiser in the Last Year: Not on file  . Ran Out of Food in the Last Year: Not on file  Transportation Needs:   . Lack of Transportation (Medical): Not on file  . Lack of Transportation (Non-Medical): Not on file  Physical Activity:   . Days of Exercise per Week: Not on file  . Minutes of Exercise per Session: Not on file  Stress:   . Feeling of Stress : Not on file  Social Connections:   . Frequency of Communication with Friends and Family: Not on file  . Frequency of Social Gatherings with Friends and Family: Not on file  . Attends Religious Services: Not on file  . Active Member of Clubs or Organizations: Not on file  . Attends Archivist Meetings: Not on file  . Marital Status: Not on file     Family History: The patient's family history includes Alzheimer's disease in his mother; Heart attack in his father; Heart disease in his father; Hyperlipidemia in his father; Other in his father. There is no history of Colon cancer, Esophageal cancer, Rectal cancer, or Stomach cancer.  ROS:    Please see the history of present illness.     All other systems reviewed and are negative.  EKGs/Labs/Other Studies Reviewed:    The following studies were reviewed today:  ECHO 2012:  - Left ventricle: The cavity size was normal. Wall thickness was increased in a pattern of mild LVH. There was mild focal basal hypertrophy of the septum. Systolic function was normal. The estimated ejection fraction was in the range of 55% to 65%. Wall motion was normal; there were no regional wall motion abnormalities. Doppler parameters  are consistent with abnormal left ventricular relaxation (grade 1 diastolic dysfunction). - Aortic valve: Mild regurgitation. - Mitral valve: Mild regurgitation. - Left atrium: The atrium was mildly dilated.  Normal ABIs 04/24/2016  EKG:  EKG is  ordered today.  The ekg ordered today demonstrates 04/30/2018-sinus rhythm first-degree AV block 244 ms otherwise unremarkable, mild baseline artifact noted inferior leads.  Personally reviewed and interpreted.  Prior EKG showed sinus rhythm with first-degree AV block otherwise normal without any changing.  Recent Labs: 09/08/2018: ALT 16; BUN 21; Creatinine, Ser 1.51; Hemoglobin 12.6; Platelets 251; Potassium 4.9; Sodium 134  Recent Lipid Panel    Component Value Date/Time   CHOL 155 03/16/2016 1653   TRIG 279 (H) 03/16/2016 1653   HDL 45 03/16/2016 1653   CHOLHDL 3.4 03/16/2016 1653   VLDL 56 (H) 03/16/2016 1653   LDLCALC 54 03/16/2016 1653    Physical Exam:    VS:  BP (!) 80/50   Pulse 80   Ht 6' (1.829 m)   Wt 236 lb (107 kg)   SpO2 98%   BMI 32.01 kg/m     Wt Readings from Last 3 Encounters:  05/15/19 236 lb (107 kg)  05/07/19 232 lb (105.2 kg)  02/23/19 235 lb 9.6 oz (106.9 kg)     GEN: Well nourished, well developed, in no acute distress Came in wheelchair, wife HEENT: normal  Neck: no JVD, carotid bruits, or masses Cardiac: RRR; no murmurs, rubs, or gallops,no edema  Respiratory:   clear to auscultation bilaterally, normal work of breathing GI: soft, nontender, nondistended, + BS MS: no deformity or atrophy  Skin: warm and dry, no rash Neuro:  Alert and Oriented x 3, Strength and sensation are intact Psych: euthymic mood, full affect   ASSESSMENT:    1. Coronary artery disease of native artery of native heart with stable angina pectoris ([place])   2. Ischemic cardiomyopathy   3. Hyperlipidemia LDL goal <70    PLAN:    In order of problems listed above:  Coronary artery disease with stable angina -   Atypical anginal symptoms with sharp shooting chest pain previously.   Low-dose aspirin.  No longer on atenolol, hypotension - Prior stent placement to the circumflex as well as RCA.   -No changes made today.  Continue current therapy.  Ischemic cardiomyopathy - At last cardiac catheterization EF had returned to normal.   Continue to encourage weight loss.  Deconditioning has been an issue.  Continue to promote movement. came in wheelchair.  Hypotension -Low now.  He has been taken off of the atenolol.  His wife occasionally will give him salt in it teaspoon.  Gatorade.  He has not had any syncope.  Agree with liberalization of salt.  Hyperlipidemia -LDL 66 from outside labs.  Creatinine 1.3, hemoglobin A1c 5.8 previously.  Atorvastatin 20 mg a day.  Encourage weight loss, diet modifications.  Obesity -Continue to encourage further weight loss.  Obstructive sleep apnea -Managed by Dr. Annamaria Boots.  Office note reviewed AHI 3.4/h.   Medication Adjustments/Labs and Tests Ordered: Current medicines are reviewed at length with the patient today.  Concerns regarding medicines are outlined above.  No orders of the defined types were placed in this encounter.  No orders of the defined types were placed in this encounter.   Patient Instructions  Your physician recommends that you continue on your current medications as directed. Please refer to the Current Medication  list given to you today.  Your physician wants you to  follow-up in: [place] will receive a reminder letter in the mail two months in advance. If you don't receive a letter, please call our office to schedule the follow-up appointment.     Signed, Benjamin Furbish, MD  05/15/2019 1:55 PM    [facility]

## 2019-05-15 NOTE — Patient Instructions (Signed)
Your physician recommends that you continue on your current medications as directed. Please refer to the Current Medication list given to you today.  Your physician wants you to follow-up in: YEAR WITH DR SKAINS  You will receive a reminder letter in the mail two months in advance. If you don't receive a letter, please call our office to schedule the follow-up appointment.  

## 2019-05-16 ENCOUNTER — Ambulatory Visit: Payer: Medicare Other | Attending: Internal Medicine

## 2019-05-16 DIAGNOSIS — Z23 Encounter for immunization: Secondary | ICD-10-CM

## 2019-05-16 NOTE — Progress Notes (Signed)
   Covid-19 Vaccination Clinic  Name:  Benjamin George    MRN: [id number] DOB: 07-Nov-1940  05/16/2019  [name] was observed post Covid-19 immunization for 15 minutes without incidence. He was provided with Vaccine Information Sheet and instruction to access the V-Safe system.   [name] was instructed to call 911 with any severe reactions post vaccine: Marland Kitchen Difficulty breathing  . Swelling of your face and throat  . A fast heartbeat  . A bad rash all over your body  . Dizziness and weakness    Immunizations Administered    Name Date Dose VIS Date Route   Pfizer COVID-19 Vaccine 05/16/2019  1:15 PM 0.3 mL 03/20/2019 Intramuscular   Manufacturer: [place]   Lot: [id number]   [place]: [id number]

## 2019-06-10 ENCOUNTER — Ambulatory Visit: Payer: Medicare Other | Attending: Internal Medicine

## 2019-06-10 DIAGNOSIS — Z23 Encounter for immunization: Secondary | ICD-10-CM

## 2019-06-10 NOTE — Progress Notes (Signed)
   Covid-19 Vaccination Clinic  Name:  Benjamin George    MRN: [id number] DOB: 1940/09/21  06/10/2019  [name] was observed post Covid-19 immunization for 15 minutes without incident. He was provided with Vaccine Information Sheet and instruction to access the V-Safe system.   [name] was instructed to call 911 with any severe reactions post vaccine: Marland Kitchen Difficulty breathing  . Swelling of face and throat  . A fast heartbeat  . A bad rash all over body  . Dizziness and weakness   Immunizations Administered    Name Date Dose VIS Date Route   Pfizer COVID-19 Vaccine 06/10/2019  9:12 AM 0.3 mL 03/20/2019 Intramuscular   Manufacturer: [place]   Lot: [id number]   [place]: [id number]

## 2019-07-02 DIAGNOSIS — K5900 Constipation, unspecified: Secondary | ICD-10-CM | POA: Diagnosis not present

## 2019-07-08 ENCOUNTER — Ambulatory Visit: Payer: Medicare Other | Admitting: Nurse Practitioner

## 2019-07-08 ENCOUNTER — Encounter: Payer: Self-pay | Admitting: Nurse Practitioner

## 2019-07-08 VITALS — BP 121/64 | HR 82 | Temp 98.7°F | Ht 72.0 in | Wt 230.8 lb

## 2019-07-08 DIAGNOSIS — K5900 Constipation, unspecified: Secondary | ICD-10-CM

## 2019-07-08 NOTE — Progress Notes (Signed)
07/08/2019 [name] NN:[id number] Mar 21, 1941   Chief Complaint:  Constipation   History of Present Illness:  Benjamin George is a 79 year old male with a history of  osteoarthritis with chronic back pain s/p stimulator, HTN, MI  X 2, afib, sleep apnea on Cpap and dementia.  He presents today for further evaluation regarding constipation. He went one week without passing a BM. He was seen by his PCP who increased Linzess from 102mcg to 171mcg daily since Thursday 3/25. He is taking Miralax every other night. He drinks prune juice most days. He eats high fiber cereal daily. His last BM was 2 days ago which he reported was a bit loose. No rectal bleeding or melena. No abdominal pain. No N/V. He underwent a colonoscopy by Dr. Fuller Plan 06/16/2014, 7 tubular adenomatous and hyperplastic polyps were removed from the cecum, transverse and sigmoid colon and mild diverticulosis to the sigmoid and transverse colon.  His wife is present.    Past Medical History:  Diagnosis Date  . Arthritis   . Atrial fibrillation ([place])   . Cardiac arrest ([place])   . Complication of anesthesia   . Gout   . Gout   . Heart attack ([place])   . Heart disease   . HLD (hyperlipidemia)   . HTN (hypertension)   . Memory disorder 12/21/2015  . PONV (postoperative nausea and vomiting)   . Sleep apnea    NPSG 10/26/07 - AHI 16.4 used5 yrs, uses CPAP nightly   Past Surgical History:  Procedure Laterality Date  . angioplasty    . APPENDECTOMY    . CAROTID STENT    . CARPECTOMY Left 07/22/2017   Procedure: LEFT WRIST HARDWARE REMOVAL AND PROXIMAL ROW CARPECTOMY;  Surgeon: Milly Jakob, MD;  Location: [facility];  Service: Orthopedics;  Laterality: Left;  . CORONARY ANGIOPLASTY WITH STENT PLACEMENT  06/27/2007   L main OK, LAD mild irreg, CFX stent OK, irreg, RCA 60% calcified, EF 45%   . HAND SURGERY     left   . HARDWARE REMOVAL Left 07/22/2017   Procedure: HARDWARE REMOVAL;  Surgeon: Milly Jakob,  MD;  Location: [facility];  Service: Orthopedics;  Laterality: Left;  . LUMBAR LAMINECTOMY/DECOMPRESSION MICRODISCECTOMY N/A 05/06/2013   Procedure: Lumbar 4-5 decompression    1 LEVEL;  Surgeon: Sinclair Ship, MD;  Location: [facility];  Service: Orthopedics;  Laterality: N/A;  Lumbar 4-5 decompression  . nasal septopalsty    . ORIF SCAPHOID FRACTURE Left 01/21/2017   Procedure: OPEN TREATMENT OF LEFT SCAPHOID FRACTURE;  Surgeon: Milly Jakob, MD;  Location: [facility];  Service: Orthopedics;  Laterality: Left;  . TOTAL SHOULDER ARTHROPLASTY  04/17/2011   Procedure: TOTAL SHOULDER ARTHROPLASTY; left Surgeon: Nita Sells, MD;  Location: [facility];  Service: Orthopedics;  Laterality: Left;   Current Outpatient Medications on File Prior to Visit  Medication Sig Dispense Refill  . aspirin 81 MG tablet Take 81 mg by mouth daily.    Marland Kitchen atorvastatin (LIPITOR) 20 MG tablet TAKE 1 TABLET BY MOUTH EVERY DAY 90 tablet 3  . linaclotide (LINZESS) 145 MCG CAPS capsule Take 145 mcg by mouth in the morning and at bedtime.    . memantine (NAMENDA) 10 MG tablet Take 1 tablet (10 mg total) by mouth 2 (two) times daily. 180 tablet 3  . Multiple Vitamin (MULTIVITAMIN) tablet Take 1 tablet by mouth every morning.     . polyethylene glycol (MIRALAX /  GLYCOLAX) 17 g packet Take 17 g by mouth daily as needed. Uses it every other day- depending on bowel movements.    . traMADol (ULTRAM) 50 MG tablet Take 1 tablet (50 mg total) by mouth every 6 (six) hours as needed. 20 tablet 0  . FLUZONE HIGH-DOSE QUADRIVALENT 0.7 ML SUSY      No current facility-administered medications on file prior to visit.   Allergies  Allergen Reactions  . Codeine Nausea And Vomiting  . Hydrocodone Nausea And Vomiting  . Aricept [Donepezil Hcl]     Nausea  . Oxycontin [Oxycodone Hcl]     Dizzy and uneasy  . Methocarbamol Nausea Only  . Other Nausea Only    vibramyacin     Current Medications,  Allergies, Past Medical History, Past Surgical History, Family History and Social History were reviewed in [employer] record.   Physical Exam: BP 121/64   Pulse 82   Temp 98.7 F (37.1 C)   Ht 6' (1.829 m)   Wt 230 lb 12.8 oz (104.7 kg)   SpO2 97%   BMI 31.30 kg/m  General:  Elderly 79 year old male in NAD, slow shuffle gait.  Head: Normocephalic and atraumatic. Eyes:  No scleral icterus. Conjunctiva pink . Ears: Normal auditory acuity. Lungs: Clear throughout to auscultation. Heart: Regular rate and rhythm, no murmur. Abdomen: Soft, nontender and nondistended. No masses or hepatomegaly. Normal bowel sounds x 4 quadrants.  Rectal: No fecal impaction. Soft brown stool smear in rectum.  Musculoskeletal: Symmetrical with no gross deformities. Extremities: No edema. Neurological: Alert oriented x 4. No focal deficits.  Psychological:  Alert and cooperative. Normal mood and affect  Assessment and Recommendations:  64. 79 year old male with dementia presents with constipation -Continue Linzess 161mcg one tab daily to be taken 30 minutes before breakfast in the morning. Miralax every other night, may take every night if needed. -Follow up PRN  2. History of colon polyps.  -Dr. Fuller Plan to verify if any further colonoscopies to be done as the patient is 58 with dementia   3. History of CAD, MI x 2 and remote hx of atrial fibrillation

## 2019-07-08 NOTE — Patient Instructions (Addendum)
If you are age 80 or older, your body mass index should be between 23-30. Your Body mass index is 31.3 kg/m. If this is out of the aforementioned range listed, please consider follow up with your Primary Care Provider.  If you are age 73 or younger, your body mass index should be between 19-25. Your Body mass index is 31.3 kg/m. If this is out of the aformentioned range listed, please consider follow up with your Primary Care Provider.   Continue with Linzess 132mcg daily  Miralax every other night, okay to take if nightly at bedtime if needed.  Follow up as needed.  Due to recent changes in healthcare laws, you may see the results of your imaging and laboratory studies on MyChart before your provider has had a chance to review them.  We understand that in some cases there may be results that are confusing or concerning to you. Not all laboratory results come back in the same time frame and the provider may be waiting for multiple results in order to interpret others.  Please give [place] 48 hours in order for your provider to thoroughly review all the results before contacting the office for clarification of your results.   Thank you for choosing [facility] Noralyn Pick, CRNP

## 2019-07-08 NOTE — Progress Notes (Signed)
Reviewed and agree with management plan. No plans for surveillance colonoscopy due to age and comorbidities.   Pricilla Riffle. Fuller Plan, MD [facility]

## 2019-07-13 DIAGNOSIS — I10 Essential (primary) hypertension: Secondary | ICD-10-CM | POA: Diagnosis not present

## 2019-07-13 DIAGNOSIS — G4733 Obstructive sleep apnea (adult) (pediatric): Secondary | ICD-10-CM | POA: Diagnosis not present

## 2019-08-03 ENCOUNTER — Ambulatory Visit: Payer: Medicare Other | Admitting: Gastroenterology

## 2019-08-18 ENCOUNTER — Other Ambulatory Visit (HOSPITAL_COMMUNITY): Payer: Self-pay | Admitting: Urology

## 2019-08-18 ENCOUNTER — Other Ambulatory Visit: Payer: Self-pay | Admitting: Urology

## 2019-08-18 DIAGNOSIS — C61 Malignant neoplasm of prostate: Secondary | ICD-10-CM

## 2019-08-26 ENCOUNTER — Ambulatory Visit: Payer: Medicare Other | Admitting: Adult Health

## 2019-08-27 ENCOUNTER — Other Ambulatory Visit: Payer: Self-pay | Admitting: Adult Health

## 2019-08-31 DIAGNOSIS — M25511 Pain in right shoulder: Secondary | ICD-10-CM | POA: Diagnosis not present

## 2019-09-28 DIAGNOSIS — M47816 Spondylosis without myelopathy or radiculopathy, lumbar region: Secondary | ICD-10-CM | POA: Diagnosis not present

## 2019-09-28 DIAGNOSIS — G894 Chronic pain syndrome: Secondary | ICD-10-CM | POA: Diagnosis not present

## 2019-09-28 DIAGNOSIS — M5137 Other intervertebral disc degeneration, lumbosacral region: Secondary | ICD-10-CM | POA: Diagnosis not present

## 2019-09-28 DIAGNOSIS — M5136 Other intervertebral disc degeneration, lumbar region: Secondary | ICD-10-CM | POA: Diagnosis not present

## 2019-10-05 DIAGNOSIS — M961 Postlaminectomy syndrome, not elsewhere classified: Secondary | ICD-10-CM | POA: Diagnosis not present

## 2019-10-05 DIAGNOSIS — M5136 Other intervertebral disc degeneration, lumbar region: Secondary | ICD-10-CM | POA: Diagnosis not present

## 2019-10-05 DIAGNOSIS — Z9889 Other specified postprocedural states: Secondary | ICD-10-CM | POA: Diagnosis not present

## 2019-10-05 DIAGNOSIS — M47816 Spondylosis without myelopathy or radiculopathy, lumbar region: Secondary | ICD-10-CM | POA: Diagnosis not present

## 2019-10-05 DIAGNOSIS — G894 Chronic pain syndrome: Secondary | ICD-10-CM | POA: Diagnosis not present

## 2019-10-13 DIAGNOSIS — I10 Essential (primary) hypertension: Secondary | ICD-10-CM | POA: Diagnosis not present

## 2019-10-13 DIAGNOSIS — E7800 Pure hypercholesterolemia, unspecified: Secondary | ICD-10-CM | POA: Diagnosis not present

## 2019-10-13 DIAGNOSIS — R7303 Prediabetes: Secondary | ICD-10-CM | POA: Diagnosis not present

## 2019-10-13 DIAGNOSIS — G4733 Obstructive sleep apnea (adult) (pediatric): Secondary | ICD-10-CM | POA: Diagnosis not present

## 2019-10-16 DIAGNOSIS — E119 Type 2 diabetes mellitus without complications: Secondary | ICD-10-CM | POA: Diagnosis not present

## 2019-10-16 DIAGNOSIS — H5211 Myopia, right eye: Secondary | ICD-10-CM | POA: Diagnosis not present

## 2019-10-16 DIAGNOSIS — H5202 Hypermetropia, left eye: Secondary | ICD-10-CM | POA: Diagnosis not present

## 2019-10-16 DIAGNOSIS — H2513 Age-related nuclear cataract, bilateral: Secondary | ICD-10-CM | POA: Diagnosis not present

## 2019-10-20 DIAGNOSIS — R413 Other amnesia: Secondary | ICD-10-CM | POA: Diagnosis not present

## 2019-10-20 DIAGNOSIS — M5440 Lumbago with sciatica, unspecified side: Secondary | ICD-10-CM | POA: Diagnosis not present

## 2019-10-20 DIAGNOSIS — E7800 Pure hypercholesterolemia, unspecified: Secondary | ICD-10-CM | POA: Diagnosis not present

## 2019-10-20 DIAGNOSIS — I10 Essential (primary) hypertension: Secondary | ICD-10-CM | POA: Diagnosis not present

## 2019-10-24 ENCOUNTER — Emergency Department (HOSPITAL_COMMUNITY): Payer: Medicare Other

## 2019-10-24 ENCOUNTER — Encounter (HOSPITAL_COMMUNITY): Payer: Self-pay

## 2019-10-24 ENCOUNTER — Emergency Department (HOSPITAL_COMMUNITY)
Admission: EM | Admit: 2019-10-24 | Discharge: 2019-10-24 | Disposition: A | Discharge status: Home / Self Care | Payer: Medicare Other | Attending: Emergency Medicine | Admitting: Emergency Medicine

## 2019-10-24 DIAGNOSIS — I10 Essential (primary) hypertension: Secondary | ICD-10-CM | POA: Insufficient documentation

## 2019-10-24 DIAGNOSIS — I700 Atherosclerosis of aorta: Secondary | ICD-10-CM | POA: Diagnosis not present

## 2019-10-24 DIAGNOSIS — Z87891 Personal history of nicotine dependence: Secondary | ICD-10-CM | POA: Diagnosis not present

## 2019-10-24 DIAGNOSIS — N201 Calculus of ureter: Secondary | ICD-10-CM | POA: Diagnosis not present

## 2019-10-24 DIAGNOSIS — M5136 Other intervertebral disc degeneration, lumbar region: Secondary | ICD-10-CM | POA: Diagnosis not present

## 2019-10-24 DIAGNOSIS — E871 Hypo-osmolality and hyponatremia: Secondary | ICD-10-CM | POA: Diagnosis not present

## 2019-10-24 DIAGNOSIS — Z96612 Presence of left artificial shoulder joint: Secondary | ICD-10-CM | POA: Diagnosis not present

## 2019-10-24 DIAGNOSIS — Z7982 Long term (current) use of aspirin: Secondary | ICD-10-CM | POA: Diagnosis not present

## 2019-10-24 DIAGNOSIS — Z8546 Personal history of malignant neoplasm of prostate: Secondary | ICD-10-CM | POA: Insufficient documentation

## 2019-10-24 DIAGNOSIS — M5126 Other intervertebral disc displacement, lumbar region: Secondary | ICD-10-CM | POA: Diagnosis not present

## 2019-10-24 DIAGNOSIS — M48061 Spinal stenosis, lumbar region without neurogenic claudication: Secondary | ICD-10-CM | POA: Diagnosis not present

## 2019-10-24 DIAGNOSIS — Z9682 Presence of neurostimulator: Secondary | ICD-10-CM | POA: Diagnosis not present

## 2019-10-24 DIAGNOSIS — N200 Calculus of kidney: Secondary | ICD-10-CM | POA: Diagnosis not present

## 2019-10-24 DIAGNOSIS — M545 Low back pain, unspecified: Secondary | ICD-10-CM

## 2019-10-24 DIAGNOSIS — I2510 Atherosclerotic heart disease of native coronary artery without angina pectoris: Secondary | ICD-10-CM | POA: Insufficient documentation

## 2019-10-24 DIAGNOSIS — Z955 Presence of coronary angioplasty implant and graft: Secondary | ICD-10-CM | POA: Diagnosis not present

## 2019-10-24 DIAGNOSIS — M4319 Spondylolisthesis, multiple sites in spine: Secondary | ICD-10-CM | POA: Diagnosis not present

## 2019-10-24 DIAGNOSIS — Z79899 Other long term (current) drug therapy: Secondary | ICD-10-CM | POA: Diagnosis not present

## 2019-10-24 DIAGNOSIS — K449 Diaphragmatic hernia without obstruction or gangrene: Secondary | ICD-10-CM | POA: Diagnosis not present

## 2019-10-24 DIAGNOSIS — K8689 Other specified diseases of pancreas: Secondary | ICD-10-CM | POA: Diagnosis not present

## 2019-10-24 LAB — URINALYSIS, ROUTINE W REFLEX MICROSCOPIC
Bilirubin Urine: NEGATIVE
Glucose, UA: NEGATIVE mg/dL
Hgb urine dipstick: NEGATIVE
Ketones, ur: NEGATIVE mg/dL
Leukocytes,Ua: NEGATIVE
Nitrite: NEGATIVE
Protein, ur: NEGATIVE mg/dL
Specific Gravity, Urine: 1.008 (ref 1.005–1.030)
pH: 6 (ref 5.0–8.0)

## 2019-10-24 LAB — CBC
HCT: 39 % (ref 39.0–52.0)
Hemoglobin: 13.2 g/dL (ref 13.0–17.0)
MCH: 30.2 pg (ref 26.0–34.0)
MCHC: 33.8 g/dL (ref 30.0–36.0)
MCV: 89.2 fL (ref 80.0–100.0)
Platelets: 267 10*3/uL (ref 150–400)
RBC: 4.37 MIL/uL (ref 4.22–5.81)
RDW: 12.4 % (ref 11.5–15.5)
WBC: 8.2 10*3/uL (ref 4.0–10.5)
nRBC: 0 % (ref 0.0–0.2)

## 2019-10-24 LAB — BASIC METABOLIC PANEL
Anion gap: 10 (ref 5–15)
BUN: 19 mg/dL (ref 8–23)
CO2: 23 mmol/L (ref 22–32)
Calcium: 8.9 mg/dL (ref 8.9–10.3)
Chloride: 95 mmol/L — ABNORMAL LOW (ref 98–111)
Creatinine, Ser: 1.26 mg/dL — ABNORMAL HIGH (ref 0.61–1.24)
GFR calc Af Amer: >60 mL/min (ref >60)
GFR calc non Af Amer: 54 mL/min — ABNORMAL LOW (ref >60)
Glucose, Bld: 146 mg/dL — ABNORMAL HIGH (ref 70–99)
Potassium: 4.7 mmol/L (ref 3.5–5.1)
Sodium: 128 mmol/L — ABNORMAL LOW (ref 135–145)

## 2019-10-24 MED ORDER — KETOROLAC TROMETHAMINE 30 MG/ML IJ SOLN
15.0000 mg | Freq: Once | INTRAMUSCULAR | Status: AC
  Administered 2019-10-24: 15 mg via INTRAVENOUS
  Filled 2019-10-24: qty 1

## 2019-10-24 MED ORDER — SODIUM CHLORIDE 0.9 % IV BOLUS
500.0000 mL | Freq: Once | INTRAVENOUS | Status: AC
  Administered 2019-10-24: 500 mL via INTRAVENOUS

## 2019-10-24 MED ORDER — TAMSULOSIN HCL 0.4 MG PO CAPS: 0.4000 mg | ORAL_CAPSULE | Freq: Every day | ORAL | 0 refills | Status: DC

## 2019-10-24 NOTE — ED Notes (Signed)
Discharge instructions reviewed with pt. Pt verbalized understanding.   

## 2019-10-24 NOTE — ED Provider Notes (Signed)
[facility] EMERGENCY DEPARTMENT Provider Note   CSN: [id number] Arrival date & time: 10/24/19  1449     History Chief Complaint  Patient presents with  . Back Pain    DIMARCO MINKIN is a 79 y.o. male.  HPI    79 yo male history of A. fib, prostate cancer, hypertension, hyperlipidemia, coronary artery disease, osteoarthritis and degenerative disc disease presents today with low back pain radiating to the right side for 4 days.  No known trauma occurred.  Patient has a spinal cord stimulator in place and was seen by the doctor the places approximately a week ago.  He had not noted any problems with this area.  They have not noted any redness or other problems.  Pain began spontaneously.  It is worse with walking.  Is currently not having pain laying in the bed.  He has taken acetaminophen without relief.  He has also been placed on skeletal muscle relaxants.  He takes tramadol 3 times a day and has continued to have pain this is worsened with walking.  His wife brought him in today as he became severely attempted to walk last night. Past Medical History:  Diagnosis Date  . Arthritis   . Atrial fibrillation ([place])   . Cardiac arrest ([place])   . Complication of anesthesia   . Gout   . Gout   . Heart attack ([place])   . Heart disease   . HLD (hyperlipidemia)   . HTN (hypertension)   . Memory disorder 12/21/2015  . PONV (postoperative nausea and vomiting)   . Sleep apnea    NPSG 10/26/07 - AHI 16.4 used5 yrs, uses CPAP nightly    Patient Active Problem List   Diagnosis Date Noted  . Hypotension   . CAP (community acquired pneumonia) 05/13/2018  . Community acquired pneumonia of left lower lobe of lung 05/12/2018  . Ischemic cardiomyopathy 04/18/2017  . Memory disorder 12/21/2015  . Spinal stenosis 05/06/2013  . Palpitations 03/02/2013  . Morbid obesity ([place]) 12/12/2011  . Arthritis of shoulder region, 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  . CAD (coronary artery disease) 10/24/2010    . Preoperative evaluation to rule out surgical contraindication 10/24/2010  . Seasonal and perennial allergic rhinitis 05/17/2010  . INSOMNIA 05/17/2010  . Hyperlipidemia LDL goal <70 04/13/2009  . GOUT 04/13/2009  . Obstructive sleep apnea 04/13/2009  . Essential hypertension 04/13/2009  . HEART ATTACK 04/13/2009  . CARDIAC ARREST 04/13/2009    Past Surgical History:  Procedure Laterality Date  . angioplasty    . APPENDECTOMY    . CAROTID STENT    . CARPECTOMY Left 07/22/2017   Procedure: LEFT WRIST HARDWARE REMOVAL AND PROXIMAL ROW CARPECTOMY;  Surgeon: Milly Jakob, MD;  Location: [facility];  Service: Orthopedics;  Laterality: Left;  . CORONARY ANGIOPLASTY WITH STENT PLACEMENT  06/27/2007   L main OK, LAD mild irreg, CFX stent OK, irreg, RCA 60% calcified, EF 45%   . HAND SURGERY     left   . HARDWARE REMOVAL Left 07/22/2017   Procedure: HARDWARE REMOVAL;  Surgeon: Milly Jakob, MD;  Location: [facility];  Service: Orthopedics;  Laterality: Left;  . LUMBAR LAMINECTOMY/DECOMPRESSION MICRODISCECTOMY N/A 05/06/2013   Procedure: Lumbar 4-5 decompression    1 LEVEL;  Surgeon: Sinclair Ship, MD;  Location: [facility];  Service: Orthopedics;  Laterality: N/A;  Lumbar 4-5 decompression  . nasal septopalsty    . ORIF SCAPHOID FRACTURE Left 01/21/2017   Procedure: OPEN TREATMENT OF  LEFT SCAPHOID FRACTURE;  Surgeon: Milly Jakob, MD;  Location: [facility];  Service: Orthopedics;  Laterality: Left;  . TOTAL SHOULDER ARTHROPLASTY  04/17/2011   Procedure: TOTAL SHOULDER ARTHROPLASTY; left Surgeon: Nita Sells, MD;  Location: [facility];  Service: Orthopedics;  Laterality: Left;       Family History  Problem Relation Age of Onset  . Heart attack Father   . Heart disease Father   . Hyperlipidemia Father   . Other Father        [name]  . Alzheimer's disease Mother   . Colon cancer Neg Hx   . Esophageal cancer Neg Hx    . Rectal cancer Neg Hx   . Stomach cancer Neg Hx     Social History   Tobacco Use  . Smoking status: Former Smoker    Packs/day: 1.50    Years: 30.00    Pack years: 45.00    Types: Cigarettes    Quit date: 04/28/1987    Years since quitting: 32.5  . Smokeless tobacco: Never Used  . Tobacco comment: smoked 2 ppd for 32 years; quit in 1989  Vaping Use  . Vaping Use: Never used  Substance Use Topics  . Alcohol use: Yes    Alcohol/week: 16.0 standard drinks    Types: 14 Cans of beer, 2 Shots of liquor per week    Comment: [place] of beers per day  . Drug use: No    Home Medications Prior to Admission medications   Medication Sig Start Date End Date Taking? Authorizing Provider  aspirin 81 MG tablet Take 81 mg by mouth daily.    [provider]  atorvastatin (LIPITOR) 20 MG tablet TAKE 1 TABLET BY MOUTH EVERY DAY 02/20/17   Larey Dresser, MD  FLUZONE HIGH-DOSE QUADRIVALENT 0.7 ML SUSY  12/03/18   [provider]  linaclotide (LINZESS) 145 MCG CAPS capsule Take 145 mcg by mouth in the morning and at bedtime.    [provider]  memantine (NAMENDA) 10 MG tablet TAKE 1 TABLET BY MOUTH TWICE A DAY 08/27/19   Ward Givens, NP  Multiple Vitamin (MULTIVITAMIN) tablet Take 1 tablet by mouth every morning.     [provider]  polyethylene glycol (MIRALAX / GLYCOLAX) 17 g packet Take 17 g by mouth daily as needed. Uses it every other day- depending on bowel movements.    [provider]  traMADol (ULTRAM) 50 MG tablet Take 1 tablet (50 mg total) by mouth every 6 (six) hours as needed. 05/17/18   Eugenie Filler, MD    Allergies    Codeine, Hydrocodone, Aricept Reather [place] hcl], Oxycontin [oxycodone hcl], Methocarbamol, and Other  Review of Systems   Review of Systems  All other systems reviewed and are negative.   Physical Exam Updated Vital Signs BP 123/72 (BP Location: Left Arm)   Pulse 91   Temp 97.8 F (36.6 C) (Oral)   Resp  18   Ht 1.829 m (6')   Wt 102.5 kg   SpO2 96%   BMI 30.65 kg/m   Physical Exam Vitals and nursing note reviewed.  Constitutional:      Appearance: Normal appearance.  HENT:     Head: Normocephalic and atraumatic.     Right Ear: External ear normal.     Left Ear: External ear normal.     Nose: Nose normal.     Mouth/Throat:     Pharynx: Oropharynx is clear.  Eyes:  Extraocular Movements: Extraocular movements intact.     Pupils: Pupils are equal, round, and reactive to light.  Cardiovascular:     Rate and Rhythm: Regular rhythm.     Pulses: Normal pulses.  Pulmonary:     Effort: Pulmonary effort is normal.     Breath sounds: Normal breath sounds.  Abdominal:     General: Abdomen is flat. Bowel sounds are normal.     Palpations: Abdomen is soft.     Comments: Mild ttp right flank and right lq  Musculoskeletal:        General: Normal range of motion.     Cervical back: Normal range of motion.  Skin:    General: Skin is warm and dry.     Capillary Refill: Capillary refill takes less than 2 seconds.  Neurological:     General: No focal deficit present.     Mental Status: He is alert. Mental status is at baseline.  Psychiatric:        Mood and Affect: Mood normal.     ED Results / Procedures / Treatments   Labs (all labs ordered are listed, but only abnormal results are displayed) Labs Reviewed  BASIC METABOLIC PANEL - Abnormal; Notable for the following components:      Result Value   Sodium 128 (*)    Chloride 95 (*)    Glucose, Bld 146 (*)    Creatinine, Ser 1.26 (*)    GFR calc non Af Amer 54 (*)    All other components within normal limits  CBC  URINALYSIS, ROUTINE W REFLEX MICROSCOPIC    EKG None  Radiology CT ABDOMEN PELVIS WO CONTRAST  Result Date: 10/24/2019 CLINICAL DATA:  Flank pain, low back pain, increased fracture risk Pt arrives to ED w/ c/o 10/10 back pain x 1 week that is mostly in the lumbar region EXAM: CT ABDOMEN AND PELVIS WITHOUT  CONTRAST TECHNIQUE: Multidetector CT imaging of the abdomen and pelvis was performed following the standard protocol without IV contrast. COMPARISON:  CT abdomen pelvis 09/25/2011 FINDINGS: Lower chest: Small hiatal hernia.  Lingular scarring/atelectasis. Evaluation of the abdominal viscera somewhat limited by the lack of IV contrast. Hepatobiliary: No focal liver abnormality is seen. No gallstones, gallbladder wall thickening, or biliary dilatation. Pancreas: Atrophic.  No surrounding inflammatory changes. Spleen: Normal in size. Adrenals/Urinary Tract: Adrenal glands are unremarkable. There is a 5 mm calculus in the proximal right ureter (series 3, image 51). No associated hydronephrosis. No left renal calculi. No large renal mass. Urinary bladder is unremarkable. Stomach/Bowel: Stomach is within normal limits. Appendix is surgically absent. No evidence of bowel wall thickening, distention, or inflammatory changes. Stool is seen throughout the colon. 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 Vascular patency cannot be assessed in the absence of IV contrast. Reproductive: Prostate is unremarkable. Other: No abdominal wall hernia or abnormality. No abdominopelvic ascites. Musculoskeletal: Spinal nerve stimulator. No acute osseous abnormality. Severe multilevel degenerative changes in the lumbar spine. IMPRESSION: 1. 5 mm calculus in the proximal right ureter without associated hydronephrosis. 2. 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 3. Small hiatal hernia. 4. Heavy stool burden. Aortic Atherosclerosis (ICD10-I70.0). Electronically Signed   By: Audie Pinto M.D.   On: 10/24/2019 17:34   CT L-SPINE NO CHARGE  Result Date: 10/24/2019 CLINICAL DATA:  Low back pain for 1 week.  No known injury. EXAM: CT LUMBAR SPINE WITHOUT CONTRAST TECHNIQUE: Multidetector CT imaging of the lumbar spine was performed without intravenous contrast administration.  Multiplanar CT  image reconstructions were also generated. COMPARISON:  CT lumbar spine 05/12/2018. FINDINGS: Segmentation: Standard. Alignment: Mild degenerative retrolisthesis L2 on L3, L3 on L4 and L5 on S1 is unchanged. Mild left scoliosis is also unchanged. Vertebrae: No acute fracture or focal pathologic process. Degenerative endplate sclerosis is eccentric to the right and worst at L2-3, L3-4 and L4-5, unchanged. Paraspinal and other soft tissues: Atherosclerosis. Spinal stimulator is in place. Disc levels: Marked multilevel loss of disc space height and vacuum disc phenomenon are again seen. The appearance is unchanged. Central canal stenosis appears worst at L3-4 where there is ligamentum flavum thickening and a shallow disc bulge with endplate spur. Marked bilateral foraminal narrowing is also present at this level. Again seen is postoperative change of decompression at L4-5. Severe right foraminal narrowing due to disc and endplate spur is unchanged. IMPRESSION: No acute abnormality.  No change compared to the prior exam. Electronically Signed   By: Inge Rise M.D.   On: 10/24/2019 17:35    Procedures Procedures (including critical care time)  Medications Ordered in ED Medications - No data to display  ED Course  I have reviewed the triage vital signs and the nursing notes.  Pertinent labs & imaging results that were available during my care of the patient were reviewed by me and considered in my medical decision making (see chart for details).    MDM Rules/Calculators/A&P                           Patient with 5 mm proximal ureteral stone Patient is followed by Dr. Alinda Money Discussed with patient and wife that he should call Dr. Festus Holts office on Monday for possible intervention They are to call his primary care doctor on Monday and have his sodium rechecked.  It is 128 today.  He is not on any medications that would account for this.  Given 500 cc of fluid here and Toradol.  He  will be placed on Flomax.  Final Clinical Impression(s) / ED Diagnoses Final diagnoses:  Right ureteral stone  Hyponatremia    Rx / DC Orders ED Discharge Orders    None       Pattricia Boss, MD 10/24/19 [phone]

## 2019-10-24 NOTE — ED Triage Notes (Addendum)
Pt arrives to ED w/ c/o 10/10 back pain x 1 week that is mostly in the lumbar region, however, pt stating the whole back hurts as well. Pt denies any injury or trauma precipitating pain, however, pain has made him fall. Pt states last 2 days ago, denies loc, denies head trauma. Pt denies acute urinary symptoms.

## 2019-10-24 NOTE — Discharge Instructions (Signed)
Call Dr. Lynne Logan office on [address] them know that you have a 5 mm stone in your ureter.  Continue ultram for pain. Continue plenty of fluid by mouth. Call Dr. Julianne Rice office to have sodium level rechecked

## 2019-10-27 DIAGNOSIS — N201 Calculus of ureter: Secondary | ICD-10-CM | POA: Diagnosis not present

## 2019-10-28 DIAGNOSIS — E871 Hypo-osmolality and hyponatremia: Secondary | ICD-10-CM | POA: Diagnosis not present

## 2019-11-11 DIAGNOSIS — N201 Calculus of ureter: Secondary | ICD-10-CM | POA: Diagnosis not present

## 2019-11-18 DIAGNOSIS — S51012A Laceration without foreign body of left elbow, initial encounter: Secondary | ICD-10-CM | POA: Diagnosis not present

## 2019-11-18 DIAGNOSIS — S80819A Abrasion, unspecified lower leg, initial encounter: Secondary | ICD-10-CM | POA: Diagnosis not present

## 2019-11-18 DIAGNOSIS — M5137 Other intervertebral disc degeneration, lumbosacral region: Secondary | ICD-10-CM | POA: Diagnosis not present

## 2019-11-18 DIAGNOSIS — M47816 Spondylosis without myelopathy or radiculopathy, lumbar region: Secondary | ICD-10-CM | POA: Diagnosis not present

## 2019-11-18 DIAGNOSIS — G894 Chronic pain syndrome: Secondary | ICD-10-CM | POA: Diagnosis not present

## 2019-11-18 DIAGNOSIS — M5136 Other intervertebral disc degeneration, lumbar region: Secondary | ICD-10-CM | POA: Diagnosis not present

## 2019-11-25 ENCOUNTER — Other Ambulatory Visit (HOSPITAL_COMMUNITY): Payer: Self-pay | Admitting: Pain Medicine

## 2019-11-25 DIAGNOSIS — G8929 Other chronic pain: Secondary | ICD-10-CM

## 2019-11-25 DIAGNOSIS — M545 Low back pain, unspecified: Secondary | ICD-10-CM

## 2019-11-26 ENCOUNTER — Ambulatory Visit: Payer: Medicare Other | Admitting: Adult Health

## 2019-12-04 ENCOUNTER — Other Ambulatory Visit: Payer: Self-pay

## 2019-12-04 ENCOUNTER — Ambulatory Visit (HOSPITAL_COMMUNITY)
Admission: RE | Admit: 2019-12-04 | Discharge: 2019-12-04 | Disposition: A | Discharge status: Home / Self Care | Payer: Medicare Other | Source: Ambulatory Visit | Attending: Pain Medicine | Admitting: Pain Medicine

## 2019-12-04 DIAGNOSIS — M79606 Pain in leg, unspecified: Secondary | ICD-10-CM | POA: Insufficient documentation

## 2019-12-04 DIAGNOSIS — M545 Low back pain: Secondary | ICD-10-CM | POA: Diagnosis not present

## 2019-12-04 DIAGNOSIS — G8929 Other chronic pain: Secondary | ICD-10-CM | POA: Diagnosis not present

## 2019-12-16 DIAGNOSIS — M5136 Other intervertebral disc degeneration, lumbar region: Secondary | ICD-10-CM | POA: Diagnosis not present

## 2019-12-16 DIAGNOSIS — G894 Chronic pain syndrome: Secondary | ICD-10-CM | POA: Diagnosis not present

## 2019-12-16 DIAGNOSIS — M5137 Other intervertebral disc degeneration, lumbosacral region: Secondary | ICD-10-CM | POA: Diagnosis not present

## 2019-12-16 DIAGNOSIS — M47816 Spondylosis without myelopathy or radiculopathy, lumbar region: Secondary | ICD-10-CM | POA: Diagnosis not present

## 2019-12-28 ENCOUNTER — Encounter (HOSPITAL_COMMUNITY)
Admission: RE | Admit: 2019-12-28 | Discharge: 2019-12-28 | Disposition: A | Discharge status: Home / Self Care | Payer: Medicare Other | Source: Ambulatory Visit | Attending: Urology | Admitting: Urology

## 2019-12-28 ENCOUNTER — Other Ambulatory Visit: Payer: Self-pay

## 2019-12-28 DIAGNOSIS — M19011 Primary osteoarthritis, right shoulder: Secondary | ICD-10-CM | POA: Diagnosis not present

## 2019-12-28 DIAGNOSIS — C61 Malignant neoplasm of prostate: Secondary | ICD-10-CM

## 2019-12-28 DIAGNOSIS — I700 Atherosclerosis of aorta: Secondary | ICD-10-CM | POA: Diagnosis not present

## 2019-12-28 DIAGNOSIS — M5136 Other intervertebral disc degeneration, lumbar region: Secondary | ICD-10-CM | POA: Diagnosis not present

## 2019-12-28 DIAGNOSIS — K056 Periodontal disease, unspecified: Secondary | ICD-10-CM | POA: Diagnosis not present

## 2019-12-28 DIAGNOSIS — M19012 Primary osteoarthritis, left shoulder: Secondary | ICD-10-CM | POA: Diagnosis not present

## 2019-12-28 DIAGNOSIS — K449 Diaphragmatic hernia without obstruction or gangrene: Secondary | ICD-10-CM | POA: Diagnosis not present

## 2019-12-28 DIAGNOSIS — N201 Calculus of ureter: Secondary | ICD-10-CM | POA: Diagnosis not present

## 2019-12-28 MED ORDER — TECHNETIUM TC 99M MEDRONATE IV KIT
21.5000 | PACK | Freq: Once | INTRAVENOUS | Status: AC | PRN
  Administered 2019-12-28: 21.5 via INTRAVENOUS

## 2019-12-30 DIAGNOSIS — N201 Calculus of ureter: Secondary | ICD-10-CM | POA: Diagnosis not present

## 2020-01-11 DIAGNOSIS — G4733 Obstructive sleep apnea (adult) (pediatric): Secondary | ICD-10-CM | POA: Diagnosis not present

## 2020-01-11 DIAGNOSIS — I10 Essential (primary) hypertension: Secondary | ICD-10-CM | POA: Diagnosis not present

## 2020-01-14 ENCOUNTER — Telehealth: Payer: Self-pay | Admitting: Adult Health

## 2020-01-14 ENCOUNTER — Telehealth: Payer: Self-pay | Admitting: *Deleted

## 2020-01-14 NOTE — Telephone Encounter (Signed)
01/13/2020 Left VM for paperwork to be picked up.

## 2020-01-14 NOTE — Telephone Encounter (Signed)
Completed [facility] medical exam report. Signed by MM/NP. To MR.   Please note in ibox certain area on form needed to be addressed.

## 2020-01-18 NOTE — Telephone Encounter (Signed)
This was updated and faxed to [place]

## 2020-01-20 DIAGNOSIS — G894 Chronic pain syndrome: Secondary | ICD-10-CM | POA: Diagnosis not present

## 2020-01-20 DIAGNOSIS — M5137 Other intervertebral disc degeneration, lumbosacral region: Secondary | ICD-10-CM | POA: Diagnosis not present

## 2020-01-20 DIAGNOSIS — M47816 Spondylosis without myelopathy or radiculopathy, lumbar region: Secondary | ICD-10-CM | POA: Diagnosis not present

## 2020-01-20 DIAGNOSIS — M5136 Other intervertebral disc degeneration, lumbar region: Secondary | ICD-10-CM | POA: Diagnosis not present

## 2020-02-09 DIAGNOSIS — H25011 Cortical age-related cataract, right eye: Secondary | ICD-10-CM | POA: Diagnosis not present

## 2020-02-09 DIAGNOSIS — H2511 Age-related nuclear cataract, right eye: Secondary | ICD-10-CM | POA: Diagnosis not present

## 2020-02-09 DIAGNOSIS — H25811 Combined forms of age-related cataract, right eye: Secondary | ICD-10-CM | POA: Diagnosis not present

## 2020-02-16 NOTE — Progress Notes (Signed)
PATIENT: Benjamin George DOB: [birth year]  REASON FOR VISIT: follow up HISTORY FROM: patient  HISTORY OF PRESENT ILLNESS: Today 02/16/20:  Benjamin George is an 79 year old male with a history of memory disturbance.  He returns today for follow-up.  He remains on Namenda 10 mg twice a day.  He is here today with his wife.  The patient is very drowsy today.  He had cataract surgery last week.  He was placed on Nucynta 50 mg 4 times a day.  Patient's wife states that he is taking this over the weekend and did not seem to be as drowsy.  She states that he has not been sleeping well at night as he has not been using his CPAP.  They have an appointment this afternoon to discuss his CPAP.  The patient requires assistance with ADLs.  He has been getting up during the night as well.  The wife manages his appointments and medications.  He returns today for an evaluation.  HISTORY 02/23/19:  Benjamin George is a 79 year old male with a history of memory disturbance.  He returns today for follow-up.  He is currently on Namenda 10 mg twice a day.  He feels that his memory is slightly worse.  He lives at home with his wife.  He is able to complete ADLs independently.  His wife states at times he does need prompting.  His wife now does all the finances.  She also keeps up with all the housework.  He denies any trouble sleeping.  Denies hallucinations.  He was on Aricept in the past but it caused nausea.  He returns today for an evaluation.   REVIEW OF SYSTEMS: Out of a complete 14 system review of symptoms, the patient complains only of the following symptoms, and all other reviewed systems are negative.  See HPI  ALLERGIES: Allergies  Allergen Reactions  . Codeine Nausea And Vomiting  . Hydrocodone Nausea And Vomiting  . Aricept [Donepezil Hcl]     Nausea  . Oxycontin [Oxycodone Hcl]     Dizzy and uneasy  . Methocarbamol Nausea Only  . Other Nausea Only    vibramyacin    HOME  MEDICATIONS: Outpatient Medications Prior to Visit  Medication Sig Dispense Refill  . aspirin 81 MG tablet Take 81 mg by mouth daily.    Marland Kitchen atorvastatin (LIPITOR) 20 MG tablet TAKE 1 TABLET BY MOUTH EVERY DAY 90 tablet 3  . FLUZONE HIGH-DOSE QUADRIVALENT 0.7 ML SUSY     . linaclotide (LINZESS) 145 MCG CAPS capsule Take 145 mcg by mouth in the morning and at bedtime.    . memantine (NAMENDA) 10 MG tablet TAKE 1 TABLET BY MOUTH TWICE A DAY 180 tablet 3  . Multiple Vitamin (MULTIVITAMIN) tablet Take 1 tablet by mouth every morning.     . polyethylene glycol (MIRALAX / GLYCOLAX) 17 g packet Take 17 g by mouth daily as needed. Uses it every other day- depending on bowel movements.    . tamsulosin (FLOMAX) 0.4 MG CAPS capsule Take 1 capsule (0.4 mg total) by mouth daily. 30 capsule 0  . traMADol (ULTRAM) 50 MG tablet Take 1 tablet (50 mg total) by mouth every 6 (six) hours as needed. 20 tablet 0   No facility-administered medications prior to visit.    PAST MEDICAL HISTORY: Past Medical History:  Diagnosis Date  . Arthritis   . Atrial fibrillation ([place])   . Cardiac arrest ([place])   . Complication of anesthesia   .  Gout   . Gout   . Heart attack ([place])   . Heart disease   . HLD (hyperlipidemia)   . HTN (hypertension)   . Memory disorder 12/21/2015  . PONV (postoperative nausea and vomiting)   . Sleep apnea    NPSG 10/26/07 - AHI 16.4 used5 yrs, uses CPAP nightly    PAST SURGICAL HISTORY: Past Surgical History:  Procedure Laterality Date  . angioplasty    . APPENDECTOMY    . CAROTID STENT    . CARPECTOMY Left 07/22/2017   Procedure: LEFT WRIST HARDWARE REMOVAL AND PROXIMAL ROW CARPECTOMY;  Surgeon: Milly Jakob, MD;  Location: [place];  Service: Orthopedics;  Laterality: Left;  . CORONARY ANGIOPLASTY WITH STENT PLACEMENT  06/27/2007   L main OK, LAD mild irreg, CFX stent OK, irreg, RCA 60% calcified, EF 45%   . HAND SURGERY     left   . HARDWARE REMOVAL Left  07/22/2017   Procedure: HARDWARE REMOVAL;  Surgeon: Milly Jakob, MD;  Location: [place];  Service: Orthopedics;  Laterality: Left;  . LUMBAR LAMINECTOMY/DECOMPRESSION MICRODISCECTOMY N/A 05/06/2013   Procedure: Lumbar 4-5 decompression    1 LEVEL;  Surgeon: Sinclair Ship, MD;  Location: [place];  Service: Orthopedics;  Laterality: N/A;  Lumbar 4-5 decompression  . nasal septopalsty    . ORIF SCAPHOID FRACTURE Left 01/21/2017   Procedure: OPEN TREATMENT OF LEFT SCAPHOID FRACTURE;  Surgeon: Milly Jakob, MD;  Location: [place];  Service: Orthopedics;  Laterality: Left;  . TOTAL SHOULDER ARTHROPLASTY  04/17/2011   Procedure: TOTAL SHOULDER ARTHROPLASTY; left Surgeon: Nita Sells, MD;  Location: [place];  Service: Orthopedics;  Laterality: Left;    FAMILY HISTORY: Family History  Problem Relation Age of Onset  . Heart attack Father   . Heart disease Father   . Hyperlipidemia Father   . Other Father        [name]  . Alzheimer's disease Mother   . Colon cancer Neg Hx   . Esophageal cancer Neg Hx   . Rectal cancer Neg Hx   . Stomach cancer Neg Hx     SOCIAL HISTORY: Social History   Socioeconomic History  . Marital status: Married    Spouse name: Not on file  . Number of children: 2  . Years of education: College/tech  . Highest education level: Not on file  Occupational History  . Occupation: Retired  Tobacco Use  . Smoking status: Former Smoker    Packs/day: 1.50    Years: 30.00    Pack years: 45.00    Types: Cigarettes    Quit date: 04/28/1987    Years since quitting: 32.8  . Smokeless tobacco: Never Used  . Tobacco comment: smoked 2 ppd for 32 years; quit in 1989  Vaping Use  . Vaping Use: Never used  Substance and Sexual Activity  . Alcohol use: Yes    Alcohol/week: 16.0 standard drinks    Types: 14 Cans of beer, 2 Shots of liquor per week    Comment: [place] of beers per day  . Drug use: No  . Sexual  activity: Not on file    Comment: Married  Other Topics Concern  . Not on file  Social History Narrative   Married, 2 children.    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 Difficulty of Paying Living Expenses: Not on file  Food Insecurity:   .  Worried About Charity fundraiser in the Last Year: Not on file  . Ran Out of Food in the Last Year: Not on file  Transportation Needs:   . Lack of Transportation (Medical): Not on file  . Lack of Transportation (Non-Medical): Not on file  Physical Activity:   . Days of Exercise per Week: Not on file  . Minutes of Exercise per Session: Not on file  Stress:   . Feeling of Stress : Not on file  Social Connections:   . Frequency of Communication with Friends and Family: Not on file  . Frequency of Social Gatherings with Friends and Family: Not on file  . Attends Religious Services: Not on file  . Active Member of Clubs or Organizations: Not on file  . Attends Archivist Meetings: Not on file  . Marital Status: Not on file  Intimate Partner Violence:   . Fear of Current or Ex-Partner: Not on file  . Emotionally Abused: Not on file  . Physically Abused: Not on file  . Sexually Abused: Not on file      PHYSICAL EXAM  Vitals:   02/17/20 0858  BP: 116/80  Weight: 199 lb 12.8 oz (90.6 kg)  Height: 6' (1.829 m)   Body mass index is 27.1 kg/m. MMSE - Mini Mental State Exam 02/23/2019 08/20/2018 12/12/2017  Not completed: (No Data) - -  Orientation to time 3 2 0  Orientation to Place 3 5 5   Registration 3 3 3   Attention/ Calculation 5 5 5   Recall 2 0 2  Language- name 2 objects 1 2 2   Language- repeat 1 1 0  Language- follow 3 step command 3 3 3   Language- read & follow direction 1 - 1  Write a sentence 0 - 1  Copy design 0 - 0  Total score 22 - 22    Generalized: Well developed, in no acute distress   Neurological examination  Mentation: Alert oriented to Person,  place.  Follows all commands. Very lethargic (took Nucynta at 830)  Cranial nerve II-XII: Pupils were equal round reactive to light. Extraocular movements were full, visual field were full on confrontational test.  Head turning and shoulder shrug  were normal and symmetric. Motor: The motor testing reveals 5 over 5 strength of all 4 extremities. Good symmetric motor tone is noted throughout.  Sensory: Sensory testing is intact to soft touch on all 4 extremities. No evidence of extinction is noted.  Coordination: Cerebellar testing reveals good finger-nose-finger and heel-to-shin bilaterally.  Gait and station:  uses a Rollator when ambulating. Marland Kitchen   DIAGNOSTIC DATA (LABS, IMAGING, TESTING) - I reviewed patient records, labs, notes, testing and imaging myself where available.  Lab Results  Component Value Date   WBC 8.2 10/24/2019   HGB 13.2 10/24/2019   HCT 39.0 10/24/2019   MCV 89.2 10/24/2019   PLT 267 10/24/2019      Component Value Date/Time   NA 128 (L) 10/24/2019 1511   K 4.7 10/24/2019 1511   CL 95 (L) 10/24/2019 1511   CO2 23 10/24/2019 1511   GLUCOSE 146 (H) 10/24/2019 1511   BUN 19 10/24/2019 1511   CREATININE 1.26 (H) 10/24/2019 1511   CREATININE 1.35 (H) 03/16/2016 1653   CALCIUM 8.9 10/24/2019 1511   PROT 6.2 (L) 09/08/2018 1315   ALBUMIN 3.7 09/08/2018 1315   AST 17 09/08/2018 1315   ALT 16 09/08/2018 1315   ALKPHOS 78 09/08/2018 1315   BILITOT 0.7 09/08/2018 1315  GFRNONAA 54 (L) 10/24/2019 1511   GFRAA >60 10/24/2019 1511   Lab Results  Component Value Date   CHOL 155 03/16/2016   HDL 45 03/16/2016   LDLCALC 54 03/16/2016   TRIG 279 (H) 03/16/2016   CHOLHDL 3.4 03/16/2016    Lab Results  Component Value Date   TSH 0.853 05/13/2018      ASSESSMENT AND PLAN 79 y.o. year old male  has a past medical history of Arthritis, Atrial fibrillation ([place]), Cardiac arrest ([place]), Complication of anesthesia, Gout, Gout, Heart attack ([place]), Heart disease, HLD  (hyperlipidemia), HTN (hypertension), Memory disorder (12/21/2015), PONV (postoperative nausea and vomiting), and Sleep apnea. here with:  1: Memory disturbance  --Unable to complete MMSE due to patient being so drowsy. --Advised wife to hold Nucynta and let the prescribing provider know. --With the exception of drowsiness his physical exam was relatively unremarkable. -- Continue Namenda 10 mg BID -- FU in 6 months or sooner if needed  I spent 30 minutes of face-to-face and non-face-to-face time with patient.  This included previsit chart review, lab review, study review, order entry, electronic health record documentation, patient education.  Ward Givens, MSN, NP-C 02/16/2020, 9:30 AM [facility] [address], [place], [place] [address] [phone]

## 2020-02-17 ENCOUNTER — Ambulatory Visit: Payer: Medicare Other | Admitting: Adult Health

## 2020-02-17 ENCOUNTER — Encounter: Payer: Self-pay | Admitting: Adult Health

## 2020-02-17 VITALS — BP 116/80 | Ht 72.0 in | Wt 199.8 lb

## 2020-02-17 DIAGNOSIS — R413 Other amnesia: Secondary | ICD-10-CM

## 2020-02-17 NOTE — Patient Instructions (Addendum)
Your Plan:  Continue namenda If your symptoms worsen or you develop new symptoms please let us know.       Thank you for coming to see us at [facility]. I hope we have been able to provide you high quality care today.  You may receive a patient satisfaction survey over the next few weeks. We would appreciate your feedback and comments so that we may continue to improve ourselves and the health of our patients.   

## 2020-02-17 NOTE — Progress Notes (Signed)
I have read the note, and I agree with the clinical assessment and plan.  Colvin Blatt K Kimbella Heisler   

## 2020-03-24 DIAGNOSIS — G894 Chronic pain syndrome: Secondary | ICD-10-CM | POA: Diagnosis not present

## 2020-03-24 DIAGNOSIS — M47816 Spondylosis without myelopathy or radiculopathy, lumbar region: Secondary | ICD-10-CM | POA: Diagnosis not present

## 2020-03-24 DIAGNOSIS — M5137 Other intervertebral disc degeneration, lumbosacral region: Secondary | ICD-10-CM | POA: Diagnosis not present

## 2020-03-24 DIAGNOSIS — M5136 Other intervertebral disc degeneration, lumbar region: Secondary | ICD-10-CM | POA: Diagnosis not present

## 2020-04-11 DIAGNOSIS — G4733 Obstructive sleep apnea (adult) (pediatric): Secondary | ICD-10-CM | POA: Diagnosis not present

## 2020-04-11 DIAGNOSIS — I10 Essential (primary) hypertension: Secondary | ICD-10-CM | POA: Diagnosis not present

## 2020-04-12 DIAGNOSIS — H2512 Age-related nuclear cataract, left eye: Secondary | ICD-10-CM | POA: Diagnosis not present

## 2020-04-12 DIAGNOSIS — H25012 Cortical age-related cataract, left eye: Secondary | ICD-10-CM | POA: Diagnosis not present

## 2020-04-12 DIAGNOSIS — H25812 Combined forms of age-related cataract, left eye: Secondary | ICD-10-CM | POA: Diagnosis not present

## 2020-04-21 DIAGNOSIS — R946 Abnormal results of thyroid function studies: Secondary | ICD-10-CM | POA: Diagnosis not present

## 2020-04-21 DIAGNOSIS — R42 Dizziness and giddiness: Secondary | ICD-10-CM | POA: Diagnosis not present

## 2020-04-21 DIAGNOSIS — R7303 Prediabetes: Secondary | ICD-10-CM | POA: Diagnosis not present

## 2020-04-21 DIAGNOSIS — R7309 Other abnormal glucose: Secondary | ICD-10-CM | POA: Diagnosis not present

## 2020-04-21 DIAGNOSIS — J9811 Atelectasis: Secondary | ICD-10-CM | POA: Diagnosis not present

## 2020-04-21 DIAGNOSIS — R413 Other amnesia: Secondary | ICD-10-CM | POA: Diagnosis not present

## 2020-04-21 DIAGNOSIS — R296 Repeated falls: Secondary | ICD-10-CM | POA: Diagnosis not present

## 2020-04-21 DIAGNOSIS — M5136 Other intervertebral disc degeneration, lumbar region: Secondary | ICD-10-CM | POA: Diagnosis not present

## 2020-04-21 DIAGNOSIS — E7800 Pure hypercholesterolemia, unspecified: Secondary | ICD-10-CM | POA: Diagnosis not present

## 2020-05-05 DIAGNOSIS — R7303 Prediabetes: Secondary | ICD-10-CM | POA: Diagnosis not present

## 2020-05-05 DIAGNOSIS — R296 Repeated falls: Secondary | ICD-10-CM | POA: Diagnosis not present

## 2020-05-05 DIAGNOSIS — R269 Unspecified abnormalities of gait and mobility: Secondary | ICD-10-CM | POA: Diagnosis not present

## 2020-05-05 DIAGNOSIS — K5903 Drug induced constipation: Secondary | ICD-10-CM | POA: Diagnosis not present

## 2020-05-05 DIAGNOSIS — R413 Other amnesia: Secondary | ICD-10-CM | POA: Diagnosis not present

## 2020-05-05 DIAGNOSIS — Z0000 Encounter for general adult medical examination without abnormal findings: Secondary | ICD-10-CM | POA: Diagnosis not present

## 2020-05-05 DIAGNOSIS — M5136 Other intervertebral disc degeneration, lumbar region: Secondary | ICD-10-CM | POA: Diagnosis not present

## 2020-05-05 DIAGNOSIS — I10 Essential (primary) hypertension: Secondary | ICD-10-CM | POA: Diagnosis not present

## 2020-05-05 DIAGNOSIS — E7800 Pure hypercholesterolemia, unspecified: Secondary | ICD-10-CM | POA: Diagnosis not present

## 2020-05-05 DIAGNOSIS — I2510 Atherosclerotic heart disease of native coronary artery without angina pectoris: Secondary | ICD-10-CM | POA: Diagnosis not present

## 2020-05-07 ENCOUNTER — Encounter (HOSPITAL_COMMUNITY): Payer: Self-pay

## 2020-05-07 ENCOUNTER — Emergency Department (HOSPITAL_COMMUNITY): Payer: Medicare Other

## 2020-05-07 ENCOUNTER — Other Ambulatory Visit: Payer: Self-pay

## 2020-05-07 ENCOUNTER — Inpatient Hospital Stay (HOSPITAL_COMMUNITY)
Admission: EM | Admit: 2020-05-07 | Discharge: 2020-05-11 | DRG: 177 | Disposition: A | Discharge status: Skilled Nursing Facility | Payer: Medicare Other | Attending: Internal Medicine | Admitting: Internal Medicine

## 2020-05-07 ENCOUNTER — Inpatient Hospital Stay (HOSPITAL_COMMUNITY): Payer: Medicare Other

## 2020-05-07 DIAGNOSIS — M109 Gout, unspecified: Secondary | ICD-10-CM | POA: Diagnosis present

## 2020-05-07 DIAGNOSIS — I10 Essential (primary) hypertension: Secondary | ICD-10-CM | POA: Diagnosis present

## 2020-05-07 DIAGNOSIS — W1830XA Fall on same level, unspecified, initial encounter: Secondary | ICD-10-CM | POA: Diagnosis present

## 2020-05-07 DIAGNOSIS — I440 Atrioventricular block, first degree: Secondary | ICD-10-CM | POA: Diagnosis present

## 2020-05-07 DIAGNOSIS — R269 Unspecified abnormalities of gait and mobility: Secondary | ICD-10-CM | POA: Diagnosis not present

## 2020-05-07 DIAGNOSIS — Z743 Need for continuous supervision: Secondary | ICD-10-CM | POA: Diagnosis not present

## 2020-05-07 DIAGNOSIS — R404 Transient alteration of awareness: Secondary | ICD-10-CM | POA: Diagnosis not present

## 2020-05-07 DIAGNOSIS — G4733 Obstructive sleep apnea (adult) (pediatric): Secondary | ICD-10-CM | POA: Diagnosis not present

## 2020-05-07 DIAGNOSIS — Z87891 Personal history of nicotine dependence: Secondary | ICD-10-CM | POA: Diagnosis not present

## 2020-05-07 DIAGNOSIS — I4891 Unspecified atrial fibrillation: Secondary | ICD-10-CM | POA: Diagnosis present

## 2020-05-07 DIAGNOSIS — Z881 Allergy status to other antibiotic agents status: Secondary | ICD-10-CM

## 2020-05-07 DIAGNOSIS — I252 Old myocardial infarction: Secondary | ICD-10-CM

## 2020-05-07 DIAGNOSIS — I2510 Atherosclerotic heart disease of native coronary artery without angina pectoris: Secondary | ICD-10-CM | POA: Diagnosis present

## 2020-05-07 DIAGNOSIS — A419 Sepsis, unspecified organism: Secondary | ICD-10-CM | POA: Diagnosis not present

## 2020-05-07 DIAGNOSIS — M1990 Unspecified osteoarthritis, unspecified site: Secondary | ICD-10-CM | POA: Diagnosis present

## 2020-05-07 DIAGNOSIS — Z79899 Other long term (current) drug therapy: Secondary | ICD-10-CM

## 2020-05-07 DIAGNOSIS — G9341 Metabolic encephalopathy: Secondary | ICD-10-CM | POA: Diagnosis not present

## 2020-05-07 DIAGNOSIS — R488 Other symbolic dysfunctions: Secondary | ICD-10-CM | POA: Diagnosis not present

## 2020-05-07 DIAGNOSIS — R262 Difficulty in walking, not elsewhere classified: Secondary | ICD-10-CM | POA: Diagnosis not present

## 2020-05-07 DIAGNOSIS — J1282 Pneumonia due to coronavirus disease 2019: Secondary | ICD-10-CM | POA: Diagnosis present

## 2020-05-07 DIAGNOSIS — Z8674 Personal history of sudden cardiac arrest: Secondary | ICD-10-CM | POA: Diagnosis not present

## 2020-05-07 DIAGNOSIS — J1281 Pneumonia due to SARS-associated coronavirus: Secondary | ICD-10-CM | POA: Diagnosis not present

## 2020-05-07 DIAGNOSIS — E785 Hyperlipidemia, unspecified: Secondary | ICD-10-CM | POA: Diagnosis not present

## 2020-05-07 DIAGNOSIS — R531 Weakness: Secondary | ICD-10-CM | POA: Diagnosis not present

## 2020-05-07 DIAGNOSIS — Z6830 Body mass index (BMI) 30.0-30.9, adult: Secondary | ICD-10-CM | POA: Diagnosis not present

## 2020-05-07 DIAGNOSIS — Z7409 Other reduced mobility: Secondary | ICD-10-CM | POA: Diagnosis not present

## 2020-05-07 DIAGNOSIS — Z83438 Family history of other disorder of lipoprotein metabolism and other lipidemia: Secondary | ICD-10-CM

## 2020-05-07 DIAGNOSIS — G9340 Encephalopathy, unspecified: Secondary | ICD-10-CM | POA: Diagnosis not present

## 2020-05-07 DIAGNOSIS — Z888 Allergy status to other drugs, medicaments and biological substances status: Secondary | ICD-10-CM

## 2020-05-07 DIAGNOSIS — M549 Dorsalgia, unspecified: Secondary | ICD-10-CM | POA: Diagnosis present

## 2020-05-07 DIAGNOSIS — M4800 Spinal stenosis, site unspecified: Secondary | ICD-10-CM | POA: Diagnosis present

## 2020-05-07 DIAGNOSIS — Z885 Allergy status to narcotic agent status: Secondary | ICD-10-CM

## 2020-05-07 DIAGNOSIS — R509 Fever, unspecified: Secondary | ICD-10-CM

## 2020-05-07 DIAGNOSIS — Z7982 Long term (current) use of aspirin: Secondary | ICD-10-CM

## 2020-05-07 DIAGNOSIS — E669 Obesity, unspecified: Secondary | ICD-10-CM | POA: Diagnosis present

## 2020-05-07 DIAGNOSIS — M6281 Muscle weakness (generalized): Secondary | ICD-10-CM | POA: Diagnosis not present

## 2020-05-07 DIAGNOSIS — R52 Pain, unspecified: Secondary | ICD-10-CM | POA: Diagnosis not present

## 2020-05-07 DIAGNOSIS — F0390 Unspecified dementia without behavioral disturbance: Secondary | ICD-10-CM

## 2020-05-07 DIAGNOSIS — Z8249 Family history of ischemic heart disease and other diseases of the circulatory system: Secondary | ICD-10-CM

## 2020-05-07 DIAGNOSIS — Z66 Do not resuscitate: Secondary | ICD-10-CM | POA: Diagnosis present

## 2020-05-07 DIAGNOSIS — M4306 Spondylolysis, lumbar region: Secondary | ICD-10-CM | POA: Diagnosis not present

## 2020-05-07 DIAGNOSIS — U071 COVID-19: Secondary | ICD-10-CM

## 2020-05-07 DIAGNOSIS — Y92002 Bathroom of unspecified non-institutional (private) residence single-family (private) house as the place of occurrence of the external cause: Secondary | ICD-10-CM | POA: Diagnosis not present

## 2020-05-07 DIAGNOSIS — M2550 Pain in unspecified joint: Secondary | ICD-10-CM | POA: Diagnosis not present

## 2020-05-07 DIAGNOSIS — Z7401 Bed confinement status: Secondary | ICD-10-CM | POA: Diagnosis not present

## 2020-05-07 DIAGNOSIS — M545 Low back pain, unspecified: Secondary | ICD-10-CM | POA: Diagnosis not present

## 2020-05-07 DIAGNOSIS — G9349 Other encephalopathy: Secondary | ICD-10-CM

## 2020-05-07 DIAGNOSIS — R0902 Hypoxemia: Secondary | ICD-10-CM | POA: Diagnosis not present

## 2020-05-07 DIAGNOSIS — G8929 Other chronic pain: Secondary | ICD-10-CM | POA: Diagnosis not present

## 2020-05-07 DIAGNOSIS — R6889 Other general symptoms and signs: Secondary | ICD-10-CM | POA: Diagnosis not present

## 2020-05-07 LAB — URINALYSIS, ROUTINE W REFLEX MICROSCOPIC
Bilirubin Urine: NEGATIVE
Glucose, UA: NEGATIVE mg/dL
Hgb urine dipstick: NEGATIVE
Ketones, ur: NEGATIVE mg/dL
Leukocytes,Ua: NEGATIVE
Nitrite: NEGATIVE
Protein, ur: NEGATIVE mg/dL
Specific Gravity, Urine: 1.011 (ref 1.005–1.030)
pH: 6 (ref 5.0–8.0)

## 2020-05-07 LAB — CBC WITH DIFFERENTIAL/PLATELET
Abs Immature Granulocytes: 0.04 10*3/uL (ref 0.00–0.07)
Basophils Absolute: 0 10*3/uL (ref 0.0–0.1)
Basophils Relative: 0 %
Eosinophils Absolute: 0.1 10*3/uL (ref 0.0–0.5)
Eosinophils Relative: 1 %
HCT: 39.1 % (ref 39.0–52.0)
Hemoglobin: 13.1 g/dL (ref 13.0–17.0)
Immature Granulocytes: 1 %
Lymphocytes Relative: 9 %
Lymphs Abs: 0.8 10*3/uL (ref 0.7–4.0)
MCH: 30.6 pg (ref 26.0–34.0)
MCHC: 33.5 g/dL (ref 30.0–36.0)
MCV: 91.4 fL (ref 80.0–100.0)
Monocytes Absolute: 1.1 10*3/uL — ABNORMAL HIGH (ref 0.1–1.0)
Monocytes Relative: 13 %
Neutro Abs: 6.5 10*3/uL (ref 1.7–7.7)
Neutrophils Relative %: 76 %
Platelets: 223 10*3/uL (ref 150–400)
RBC: 4.28 MIL/uL (ref 4.22–5.81)
RDW: 12.7 % (ref 11.5–15.5)
WBC: 8.5 10*3/uL (ref 4.0–10.5)
nRBC: 0 % (ref 0.0–0.2)

## 2020-05-07 LAB — LACTATE DEHYDROGENASE: LDH: 112 U/L (ref 98–192)

## 2020-05-07 LAB — APTT: aPTT: 26 seconds (ref 24–36)

## 2020-05-07 LAB — COMPREHENSIVE METABOLIC PANEL
ALT: 15 U/L (ref 0–44)
AST: 15 U/L (ref 15–41)
Albumin: 3.5 g/dL (ref 3.5–5.0)
Alkaline Phosphatase: 90 U/L (ref 38–126)
Anion gap: 9 (ref 5–15)
BUN: 21 mg/dL (ref 8–23)
CO2: 22 mmol/L (ref 22–32)
Calcium: 8.3 mg/dL — ABNORMAL LOW (ref 8.9–10.3)
Chloride: 104 mmol/L (ref 98–111)
Creatinine, Ser: 1.21 mg/dL (ref 0.61–1.24)
GFR, Estimated: >60 mL/min (ref >60)
Glucose, Bld: 102 mg/dL — ABNORMAL HIGH (ref 70–99)
Potassium: 4.5 mmol/L (ref 3.5–5.1)
Sodium: 135 mmol/L (ref 135–145)
Total Bilirubin: 0.5 mg/dL (ref 0.3–1.2)
Total Protein: 5.9 g/dL — ABNORMAL LOW (ref 6.5–8.1)

## 2020-05-07 LAB — LACTIC ACID, PLASMA: Lactic Acid, Venous: 1.2 mmol/L (ref 0.5–1.9)

## 2020-05-07 LAB — D-DIMER, QUANTITATIVE: D-Dimer, Quant: 0.44 ug/mL-FEU (ref 0.00–0.50)

## 2020-05-07 LAB — HEPATITIS B SURFACE ANTIGEN: Hepatitis B Surface Ag: NONREACTIVE

## 2020-05-07 LAB — PROTIME-INR
INR: 1.1 (ref 0.8–1.2)
Prothrombin Time: 13.6 seconds (ref 11.4–15.2)

## 2020-05-07 LAB — TROPONIN I (HIGH SENSITIVITY)
Troponin I (High Sensitivity): 9 ng/L (ref <18)
Troponin I (High Sensitivity): 12 ng/L (ref <18)

## 2020-05-07 LAB — PROCALCITONIN: Procalcitonin: <0.1 ng/mL

## 2020-05-07 LAB — C-REACTIVE PROTEIN: CRP: 1.5 mg/dL — ABNORMAL HIGH (ref <1.0)

## 2020-05-07 LAB — RESP PANEL BY RT-PCR (FLU A&B, COVID) ARPGX2
Influenza A by PCR: NEGATIVE
Influenza B by PCR: NEGATIVE
SARS Coronavirus 2 by RT PCR: POSITIVE — AB

## 2020-05-07 LAB — TYPE AND SCREEN
ABO/RH(D): AB POS
Antibody Screen: NEGATIVE

## 2020-05-07 LAB — BRAIN NATRIURETIC PEPTIDE: B Natriuretic Peptide: 114.8 pg/mL — ABNORMAL HIGH (ref 0.0–100.0)

## 2020-05-07 LAB — FIBRINOGEN: Fibrinogen: 404 mg/dL (ref 210–475)

## 2020-05-07 LAB — CK: Total CK: 52 U/L (ref 49–397)

## 2020-05-07 LAB — FERRITIN: Ferritin: 204 ng/mL (ref 24–336)

## 2020-05-07 MED ORDER — ACETAMINOPHEN 325 MG PO TABS
650.0000 mg | ORAL_TABLET | Freq: Four times a day (QID) | ORAL | Status: DC | PRN
  Administered 2020-05-07 – 2020-05-10 (×3): 650 mg via ORAL
  Filled 2020-05-07 (×4): qty 2

## 2020-05-07 MED ORDER — MEMANTINE HCL 5 MG PO TABS
10.0000 mg | ORAL_TABLET | Freq: Two times a day (BID) | ORAL | Status: DC
  Administered 2020-05-07 – 2020-05-11 (×8): 10 mg via ORAL
  Filled 2020-05-07 (×9): qty 2

## 2020-05-07 MED ORDER — ENOXAPARIN SODIUM 40 MG/0.4ML ~~LOC~~ SOLN
40.0000 mg | SUBCUTANEOUS | Status: DC
  Administered 2020-05-07 – 2020-05-11 (×5): 40 mg via SUBCUTANEOUS
  Filled 2020-05-07 (×5): qty 0.4

## 2020-05-07 MED ORDER — IOHEXOL 300 MG/ML  SOLN
100.0000 mL | Freq: Once | INTRAMUSCULAR | Status: AC | PRN
  Administered 2020-05-07: 100 mL via INTRAVENOUS

## 2020-05-07 MED ORDER — LINACLOTIDE 145 MCG PO CAPS
145.0000 ug | ORAL_CAPSULE | Freq: Every day | ORAL | Status: DC
  Administered 2020-05-08 – 2020-05-11 (×3): 145 ug via ORAL
  Filled 2020-05-07 (×5): qty 1

## 2020-05-07 MED ORDER — ASPIRIN EC 81 MG PO TBEC
81.0000 mg | DELAYED_RELEASE_TABLET | Freq: Every day | ORAL | Status: DC
  Administered 2020-05-07 – 2020-05-11 (×5): 81 mg via ORAL
  Filled 2020-05-07 (×5): qty 1

## 2020-05-07 MED ORDER — ALLOPURINOL 100 MG PO TABS: 100.0000 mg | ORAL_TABLET | Freq: Every day | ORAL | Status: DC

## 2020-05-07 MED ORDER — ATORVASTATIN CALCIUM 10 MG PO TABS
20.0000 mg | ORAL_TABLET | Freq: Every day | ORAL | Status: DC
  Administered 2020-05-07 – 2020-05-11 (×5): 20 mg via ORAL
  Filled 2020-05-07 (×5): qty 2

## 2020-05-07 MED ORDER — ACETAMINOPHEN 650 MG RE SUPP: 650.0000 mg | Freq: Four times a day (QID) | RECTAL | Status: DC | PRN

## 2020-05-07 MED ORDER — POLYETHYLENE GLYCOL 3350 17 G PO PACK: 17.0000 g | PACK | Freq: Every day | ORAL | Status: DC | PRN

## 2020-05-07 MED ORDER — OLANZAPINE 10 MG IM SOLR
2.5000 mg | Freq: Once | INTRAMUSCULAR | Status: DC
  Filled 2020-05-07: qty 10

## 2020-05-07 NOTE — H&P (Signed)
History and Physical    Benjamin George WUJ:[id number] DOB: January 29, 1941 DOA: 05/07/2020  PCP: [name], MD (Confirm with patient/family/NH records and if not entered, this has to be entered at [facility] point of entry) Patient coming from: Home  I have personally briefly reviewed patient's old medical records in [place]  Chief Complaint: Feeling weak  HPI: Benjamin George is a 80 y.o. 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  Patient is demented, most history provided by patient wife over the phone.  Wife reported that patient baseline use walker to ambulate.  Last night, patient went to bathroom by his own but then he called his wife complaining about unable to get up himself from the toilet bowl.  Wife went to help him and found patient was very weak and she was not able to help him to stand up.  And patient was warm to touch she checked his temp was 102.  In addition, wife also reported patient appeared pale and breathing heavily.  Wife did not notice any cough and no complaining about sore throat or fever earlier during the day.  There was also no diarrhea.  EMS arrived and found patient hypoxic, placed on 2 L. ED Course: Afebrile, O2 saturation 95% on room air and oxygen turned off.  WBC within normal limits chest x-ray showed no acute infiltrates.  UA showed no leukocyte or nitrite.  Covid test came back positive.  Review of Systems: Unable to perform patient demented  Past Medical History:  Diagnosis Date  . Arthritis   . Atrial fibrillation ([place])   . Cardiac arrest ([place])   . Complication of anesthesia   . Gout   . Gout   . Heart attack ([place])   . Heart disease   . HLD (hyperlipidemia)   . HTN (hypertension)   . Memory disorder 12/21/2015  . PONV (postoperative nausea and vomiting)   .  Sleep apnea    NPSG 10/26/07 - AHI 16.4 used5 yrs, uses CPAP nightly    Past Surgical History:  Procedure Laterality Date  . angioplasty    . APPENDECTOMY    . CAROTID STENT    . CARPECTOMY Left 07/22/2017   Procedure: LEFT WRIST HARDWARE REMOVAL AND PROXIMAL ROW CARPECTOMY;  Surgeon: Milly Jakob, MD;  Location: [place];  Service: Orthopedics;  Laterality: Left;  . CORONARY ANGIOPLASTY WITH STENT PLACEMENT  06/27/2007   L main OK, LAD mild irreg, CFX stent OK, irreg, RCA 60% calcified, EF 45%   . HAND SURGERY     left   . HARDWARE REMOVAL Left 07/22/2017   Procedure: HARDWARE REMOVAL;  Surgeon: Milly Jakob, MD;  Location: [place];  Service: Orthopedics;  Laterality: Left;  . LUMBAR LAMINECTOMY/DECOMPRESSION MICRODISCECTOMY N/A 05/06/2013   Procedure: Lumbar 4-5 decompression    1 LEVEL;  Surgeon: Sinclair Ship, MD;  Location: [place];  Service: Orthopedics;  Laterality: N/A;  Lumbar 4-5 decompression  . nasal septopalsty    . ORIF SCAPHOID FRACTURE Left 01/21/2017   Procedure: OPEN TREATMENT OF LEFT SCAPHOID FRACTURE;  Surgeon: Milly Jakob, MD;  Location: [place];  Service: Orthopedics;  Laterality: Left;  . TOTAL SHOULDER ARTHROPLASTY  04/17/2011   Procedure: TOTAL SHOULDER ARTHROPLASTY; left Surgeon: Nita Sells, MD;  Location: [place];  Service: Orthopedics;  Laterality: Left;     reports that he quit smoking about 33 years ago. His smoking use included cigarettes. He has a 45.00 pack-year smoking history. He has never used smokeless tobacco. He reports current alcohol use of about 16.0 standard drinks of alcohol per week. He reports that he does not use drugs.  Allergies  Allergen Reactions  . Codeine Nausea And Vomiting and Other (See Comments)  . Hydrocodone Nausea And Vomiting  . Aricept [Donepezil Hcl]     Nausea  . Morphine Sulfate Other (See Comments)  . Oxycodone Hcl Other (See Comments)     Dizzy and uneasy  . Pregabalin Other (See Comments)  . Methocarbamol Nausea Only and Other (See Comments)  . Other Nausea Only and Other (See Comments)    vibramyacin    Family History  Problem Relation Age of Onset  . Heart attack Father   . Heart disease Father   . Hyperlipidemia Father   . Other Father        [name]  . Alzheimer's disease Mother   . Colon cancer Neg Hx   . Esophageal cancer Neg Hx   . Rectal cancer Neg Hx   . Stomach cancer Neg Hx      Prior to Admission medications   Medication Sig Start Date End Date Taking? Authorizing Provider  allopurinol (ZYLOPRIM) 100 MG tablet Take 1 tablet by mouth daily.    [provider]  aspirin 81 MG tablet Take 81 mg by mouth daily.    [provider]  atorvastatin (LIPITOR) 20 MG tablet TAKE 1 TABLET BY MOUTH EVERY DAY 02/20/17   Larey Dresser, MD  linaclotide [facility]) 145 MCG CAPS capsule Take 145 mcg by mouth in the morning and at bedtime.    [provider]  memantine (NAMENDA) 10 MG tablet TAKE 1 TABLET BY MOUTH TWICE A DAY 08/27/19   Ward Givens, NP  Multiple Vitamin (MULTIVITAMIN) tablet Take 1 tablet by mouth every morning.     [provider]  NUCYNTA 50 MG tablet Take 50 mg by mouth 4 (four) times daily. 02/08/20   [provider]  polyethylene glycol (MIRALAX / GLYCOLAX) 17 g packet Take 17 g by mouth daily as needed. Uses it every other day- depending on bowel movements.    [provider]    Physical Exam: Vitals:   05/07/20 0700 05/07/20 0715 05/07/20 0730 05/07/20 0745  BP: 133/68 132/65 130/68 112/62  Pulse: 85 82 80 77  Resp: 19 19 16 16   Temp:      TempSrc:      SpO2: 92% 91% 91% 93%    Constitutional: NAD, calm, comfortable Vitals:   05/07/20 0700 05/07/20 0715 05/07/20 0730 05/07/20 0745  BP: 133/68 132/65 130/68 112/62  Pulse: 85 82 80 77  Resp: 19 19 16 16   Temp:      TempSrc:      SpO2: 92% 91% 91% 93%   Eyes: PERRL, lids  and conjunctivae normal ENMT: Mucous membranes are moist. Posterior pharynx clear of any exudate or lesions.Normal dentition.  Neck: normal, supple, no masses, no thyromegaly Respiratory: clear to auscultation bilaterally, no wheezing, no crackles. Normal respiratory effort. No accessory muscle use.  Cardiovascular: Regular rate and rhythm, no murmurs / rubs / gallops. No extremity edema. 2+ pedal pulses. No carotid bruits.  Abdomen: no tenderness, no masses palpated. No hepatosplenomegaly. Bowel sounds positive.  Musculoskeletal: no clubbing / cyanosis. No joint deformity upper and lower extremities. Good ROM,  no contractures. Normal muscle tone.  Skin: no rashes, lesions, ulcers. No induration Neurologic: No facial droop.  Unable to examine patient lower extremity strength patient noncooperative but appears to move both legs.  Psychiatric: Baseline dementia.     Labs on Admission: I have personally reviewed following labs and imaging studies  CBC: Recent Labs  Lab 05/07/20 0433  WBC 8.5  NEUTROABS 6.5  HGB 13.1  HCT 39.1  MCV 91.4  PLT [id number]   Basic Metabolic Panel: Recent Labs  Lab 05/07/20 0433  NA 135  K 4.5  CL 104  CO2 22  GLUCOSE 102*  BUN 21  CREATININE 1.21  CALCIUM 8.3*   GFR: CrCl cannot be calculated (Unknown ideal weight.). Liver Function Tests: Recent Labs  Lab 05/07/20 0433  AST 15  ALT 15  ALKPHOS 90  BILITOT 0.5  PROT 5.9*  ALBUMIN 3.5   No results for input(s): LIPASE, AMYLASE in the last 168 hours. No results for input(s): AMMONIA in the last 168 hours. Coagulation Profile: Recent Labs  Lab 05/07/20 0433  INR 1.1   Cardiac Enzymes: No results for input(s): CKTOTAL, CKMB, CKMBINDEX, TROPONINI in the last 168 hours. BNP (last 3 results) No results for input(s): PROBNP in the last 8760 hours. HbA1C: No results for input(s): HGBA1C in the last 72 hours. CBG: No results for input(s): GLUCAP in the last 168 hours. Lipid Profile: No  results for input(s): CHOL, HDL, LDLCALC, TRIG, CHOLHDL, LDLDIRECT in the last 72 hours. Thyroid Function Tests: No results for input(s): TSH, T4TOTAL, FREET4, T3FREE, THYROIDAB in the last 72 hours. Anemia Panel: No results for input(s): VITAMINB12, FOLATE, FERRITIN, TIBC, IRON, RETICCTPCT in the last 72 hours. Urine analysis:    Component Value Date/Time   COLORURINE YELLOW 05/07/2020 0440   APPEARANCEUR CLEAR 05/07/2020 0440   LABSPEC 1.011 05/07/2020 0440   PHURINE 6.0 05/07/2020 0440   GLUCOSEU NEGATIVE 05/07/2020 0440   HGBUR NEGATIVE 05/07/2020 0440   BILIRUBINUR NEGATIVE 05/07/2020 0440   KETONESUR NEGATIVE 05/07/2020 0440   PROTEINUR NEGATIVE 05/07/2020 0440   UROBILINOGEN 0.2 04/27/2013 0940   NITRITE NEGATIVE 05/07/2020 0440   LEUKOCYTESUR NEGATIVE 05/07/2020 0440    Radiological Exams on Admission: DG Chest Port 1 View  Result Date: 05/07/2020 CLINICAL DATA:  Questionable sepsis EXAM: PORTABLE CHEST 1 VIEW COMPARISON:  05/12/2018 FINDINGS: Indistinct opacity at the lung bases. Normal heart size and mediastinal contours for technique. No visible effusion or pneumothorax. IMPRESSION: Mild atelectasis or infiltrates at the lung bases. Electronically Signed   By: Monte Fantasia M.D.   On: 05/07/2020 05:18    EKG: Independently reviewed.  Sinus, no acute ST changes  Assessment/Plan Active Problems:   Decreased ambulation status   Impaired ambulation  (please populate well all problems here in Problem List. (For example, if patient is on BP meds at home and you resume or decide to hold them, it is a problem that needs to be her. Same for CAD, COPD, HLD and so on)  Acute on chronic ambulation dysfunction -Sudden deterioration of ambulation in the matter of few minutes last night, and given the significant history of extensive lumbar spine OA and degeneration, concerning about acute event such as fracture and infection (has a foreign body of stimulator in his back), ordered  CT lumbar spine with and without contrast. -PT evaluation if negative for fracture or infection on CT scan  COVID-19 infection -Patient had brief episode of hypoxia at home but no hypoxia in ED and x-ray showed no  clear pneumonia/infiltrates. -Send COVID labs, will monitor off antiviral treatment for now.  Fever -Probably related to Covid infection but no symptoms or signs of pneumonia, no UTI and no diarrhea. -Monitor off antibiotics for now  Gout -Continue allopurinol   DVT prophylaxis: Lovenox  code Status: DNR Family Communication: Wife over the phone Disposition Plan: Expect more than 2 midnight hospital stay for PT evaluation Consults called: None Admission status: MedSurg admission   Lequita Halt MD [facility] Pager [phone]  05/07/2020, 8:38 AM

## 2020-05-07 NOTE — Progress Notes (Signed)
Pt is pulling on condom cath and attempting to get out of bed multiple times. Reorient pt every encounter. Nurse is sitting at pts bedside so pt will not get out of bed. pts wife, and care giver is on the way to sit with pt. Charge nurse and MD aware

## 2020-05-07 NOTE — ED Triage Notes (Signed)
Pt arrives EMS with c/o sudden generalized weakness. Pt wife found him getting out of bed attempting to go to bathroom but was not able to get there. Pt was not able to bear own weight. Denies any covid exposure. Hx dementia but usually alert and able to complete ADL's. Per family gets confused here and there.   Temp 102 ( 1000mg  tylenol administered at 0355).  Hr 88 rr 20 spo2 95% ra  cbg 124

## 2020-05-07 NOTE — Progress Notes (Signed)
Pt pulled IV out, Dr Roosevelt Locks states he does not need one for now. Patient laughing stated he does not want one

## 2020-05-07 NOTE — Progress Notes (Signed)
Pts wife arrived and sitting at bedside, pt calmer.

## 2020-05-07 NOTE — ED Notes (Addendum)
Upon entering the room, pt had removed all leads, BP, O2, and IV. I started a new IV and collected lab orders.

## 2020-05-07 NOTE — ED Provider Notes (Signed)
[facility] DEPT Provider Note   CSN: [id number] Arrival date & time: 05/07/20  [id number]     History Chief Complaint  Patient presents with  . Fever    Benjamin George is a 80 y.o. male.  Patient brought to the emergency department for evaluation of fever, altered mental status and generalized weakness.  Wife reports that the patient said he was not feeling well this evening and went to bed early.  She woke up at 2 AM with the patient sitting on the edge of the bed, trying to get up.  He was too weak to stand on his own.  She reports that he does have some mild dementia but he is more altered than usual.  EMS bring him to the emergency department from home.  He was initially tachypneic and mildly hypoxic, placed on oxygen.  He was also mildly hypotensive, given 500 mL of IV fluid bolus.  He reportedly is now more awake and alert.  He was given Tylenol for fever of 102 at home.        Past Medical History:  Diagnosis Date  . Arthritis   . Atrial fibrillation ([place])   . Cardiac arrest ([place])   . Complication of anesthesia   . Gout   . Gout   . Heart attack ([place])   . Heart disease   . HLD (hyperlipidemia)   . HTN (hypertension)   . Memory disorder 12/21/2015  . PONV (postoperative nausea and vomiting)   . Sleep apnea    NPSG 10/26/07 - AHI 16.4 used5 yrs, uses CPAP nightly    Patient Active Problem List   Diagnosis Date Noted  . Hypotension   . CAP (community acquired pneumonia) 05/13/2018  . Community acquired pneumonia of left lower lobe of lung 05/12/2018  . Ischemic cardiomyopathy 04/18/2017  . Memory disorder 12/21/2015  . Spinal stenosis 05/06/2013  . Palpitations 03/02/2013  . Morbid obesity ([place]) 12/12/2011  . Arthritis of shoulder region, 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  . CAD (coronary artery disease) 10/24/2010  . Preoperative evaluation to rule out surgical contraindication 10/24/2010  . Seasonal and perennial allergic rhinitis 05/17/2010  .  INSOMNIA 05/17/2010  . Hyperlipidemia LDL goal <70 04/13/2009  . GOUT 04/13/2009  . Obstructive sleep apnea 04/13/2009  . Essential hypertension 04/13/2009  . HEART ATTACK 04/13/2009  . CARDIAC ARREST 04/13/2009    Past Surgical History:  Procedure Laterality Date  . angioplasty    . APPENDECTOMY    . CAROTID STENT    . CARPECTOMY Left 07/22/2017   Procedure: LEFT WRIST HARDWARE REMOVAL AND PROXIMAL ROW CARPECTOMY;  Surgeon: Milly Jakob, MD;  Location: [facility];  Service: Orthopedics;  Laterality: Left;  . CORONARY ANGIOPLASTY WITH STENT PLACEMENT  06/27/2007   L main OK, LAD mild irreg, CFX stent OK, irreg, RCA 60% calcified, EF 45%   . HAND SURGERY     left   . HARDWARE REMOVAL Left 07/22/2017   Procedure: HARDWARE REMOVAL;  Surgeon: Milly Jakob, MD;  Location: [facility];  Service: Orthopedics;  Laterality: Left;  . LUMBAR LAMINECTOMY/DECOMPRESSION MICRODISCECTOMY N/A 05/06/2013   Procedure: Lumbar 4-5 decompression    1 LEVEL;  Surgeon: Sinclair Ship, MD;  Location: [facility];  Service: Orthopedics;  Laterality: N/A;  Lumbar 4-5 decompression  . nasal septopalsty    . ORIF SCAPHOID FRACTURE Left 01/21/2017   Procedure: OPEN TREATMENT OF LEFT SCAPHOID FRACTURE;  Surgeon: Milly Jakob, MD;  Location: [facility];  Service: Orthopedics;  Laterality: Left;  . TOTAL SHOULDER ARTHROPLASTY  04/17/2011   Procedure: TOTAL SHOULDER ARTHROPLASTY; left Surgeon: Nita Sells, MD;  Location: [facility];  Service: Orthopedics;  Laterality: Left;       Family History  Problem Relation Age of Onset  . Heart attack Father   . Heart disease Father   . Hyperlipidemia Father   . Other Father        Benjamin George  . Alzheimer's disease Mother   . Colon cancer Neg Hx   . Esophageal cancer Neg Hx   . Rectal cancer Neg Hx   . Stomach cancer Neg Hx     Social History   Tobacco Use  . Smoking status: Former Smoker     Packs/day: 1.50    Years: 30.00    Pack years: 45.00    Types: Cigarettes    Quit date: 04/28/1987    Years since quitting: 33.0  . Smokeless tobacco: Never Used  . Tobacco comment: smoked 2 ppd for 32 years; quit in 1989  Vaping Use  . Vaping Use: Never used  Substance Use Topics  . Alcohol use: Yes    Alcohol/week: 16.0 standard drinks    Types: 14 Cans of beer, 2 Shots of liquor per week    Comment: [place] of beers per day  . Drug use: No    Home Medications Prior to Admission medications   Medication Sig Start Date End Date Taking? Authorizing Provider  allopurinol (ZYLOPRIM) 100 MG tablet Take 1 tablet by mouth daily.    [provider]  aspirin 81 MG tablet Take 81 mg by mouth daily.    [provider]  atorvastatin (LIPITOR) 20 MG tablet TAKE 1 TABLET BY MOUTH EVERY DAY 02/20/17   Larey Dresser, MD  linaclotide [facility]) 145 MCG CAPS capsule Take 145 mcg by mouth in the morning and at bedtime.    [provider]  memantine (NAMENDA) 10 MG tablet TAKE 1 TABLET BY MOUTH TWICE A DAY 08/27/19   Ward Givens, NP  Multiple Vitamin (MULTIVITAMIN) tablet Take 1 tablet by mouth every morning.     [provider]  NUCYNTA 50 MG tablet Take 50 mg by mouth 4 (four) times daily. 02/08/20   [provider]  polyethylene glycol (MIRALAX / GLYCOLAX) 17 g packet Take 17 g by mouth daily as needed. Uses it every other day- depending on bowel movements.    [provider]    Allergies    Codeine, Hydrocodone, Aricept [donepezil hcl], Morphine sulfate, Oxycodone hcl, Pregabalin, Methocarbamol, and Other  Review of Systems   Review of Systems  Constitutional: Positive for fever.  Neurological: Positive for weakness.  Psychiatric/Behavioral: Positive for confusion.  All other systems reviewed and are negative.   Physical Exam Updated Vital Signs BP 129/74   Pulse 84   Temp 98.7 F (37.1 C) (Oral)   Resp 17   SpO2 95%    Physical Exam Vitals and nursing note reviewed.  Constitutional:      General: He is not in acute distress.    Appearance: Normal appearance. He is well-developed and well-nourished.  HENT:     Head: Normocephalic and atraumatic.     Right Ear: Hearing normal.     Left Ear: Hearing normal.     Nose: Nose normal.     Mouth/Throat:     Mouth: Oropharynx is clear and moist and mucous membranes are normal.  Eyes:     Extraocular  Movements: EOM normal.     Conjunctiva/sclera: Conjunctivae normal.     Pupils: Pupils are equal, round, and reactive to light.  Cardiovascular:     Rate and Rhythm: Regular rhythm.     Heart sounds: S1 normal and S2 normal. No murmur heard. No friction rub. No gallop.   Pulmonary:     Effort: Pulmonary effort is normal. No respiratory distress.     Breath sounds: Normal breath sounds.  Chest:     Chest wall: No tenderness.  Abdominal:     General: Bowel sounds are normal.     Palpations: Abdomen is soft. There is no hepatosplenomegaly.     Tenderness: There is no abdominal tenderness. There is no guarding or rebound. Negative signs include Murphy's sign and McBurney's sign.     Hernia: No hernia is present.  Musculoskeletal:        General: Normal range of motion.     Cervical back: Normal range of motion and neck supple.  Skin:    General: Skin is warm, dry and intact.     Findings: No rash.     Nails: There is no cyanosis.  Neurological:     Mental Status: He is alert. He is confused.     GCS: GCS eye subscore is 4. GCS verbal subscore is 4. GCS motor subscore is 6.     Cranial Nerves: No cranial nerve deficit.     Sensory: No sensory deficit.     Coordination: Coordination normal.     Deep Tendon Reflexes: Strength normal.  Psychiatric:        Mood and Affect: Mood and affect normal.        Speech: Speech normal.        Behavior: Behavior normal.        Thought Content: Thought content normal.     ED Results / Procedures / Treatments    Labs (all labs ordered are listed, but only abnormal results are displayed) Labs Reviewed  COMPREHENSIVE METABOLIC PANEL - Abnormal; Notable for the following components:      Result Value   Glucose, Bld 102 (*)    Calcium 8.3 (*)    Total Protein 5.9 (*)    All other components within normal limits  CBC WITH DIFFERENTIAL/PLATELET - Abnormal; Notable for the following components:   Monocytes Absolute 1.1 (*)    All other components within normal limits  CULTURE, BLOOD (SINGLE)  URINE CULTURE  RESPIRATORY PANEL BY RT PCR (FLU A&B, COVID)  LACTIC ACID, PLASMA  PROTIME-INR  APTT  URINALYSIS, ROUTINE W REFLEX MICROSCOPIC    EKG EKG Interpretation  Date/Time:  Saturday May 07 2020 04:40:08 EST Ventricular Rate:  80 PR Interval:    QRS Duration: 106 QT Interval:  373 QTC Calculation: 431 R Axis:   59 Text Interpretation: Sinus rhythm Prolonged PR interval Inferior infarct, old Confirmed by Orpah Greek [phone]) on 05/07/2020 4:50:31 AM   Radiology DG Chest Port 1 View  Result Date: 05/07/2020 CLINICAL DATA:  Questionable sepsis EXAM: PORTABLE CHEST 1 VIEW COMPARISON:  05/12/2018 FINDINGS: Indistinct opacity at the lung bases. Normal heart size and mediastinal contours for technique. No visible effusion or pneumothorax. IMPRESSION: Mild atelectasis or infiltrates at the lung bases. Electronically Signed   By: Monte Fantasia M.D.   On: 05/07/2020 05:18    Procedures Procedures   Medications Ordered in ED Medications - No data to display  ED Course  I have reviewed the triage vital signs and the  nursing notes.  Pertinent labs & imaging results that were available during my care of the patient were reviewed by me and considered in my medical decision making (see chart for details).    MDM Rules/Calculators/A&P                          Patient presents to the emergency department from home.  Patient noted to be more confused than usual and extremely weak by  his wife.  He started to feel ill in the evening and then went to bed early.  EMS report that patient was hypotensive and hypoxic with tachypnea.  Patient was given a fluid bolus during transport and placed on oxygen.  He has actually weaned off of oxygen currently and is maintaining his saturations around 95%.  Blood pressures have been normal here in the department.  He has defervesced after being given Tylenol by EMS.  Patient remains confused, unclear how far off from his baseline he is.  Attempts to ambulate the patient were unsuccessful.  He was requiring 2 people to help him stand up from a sitting position and he can not initiate steps on his own.  Final Clinical Impression(s) / ED Diagnoses Final diagnoses:  Fever, unspecified fever cause  Encephalopathy acute  Weakness generalized    Rx / DC Orders ED Discharge Orders    None       Orpah Greek, MD 05/07/20 [phone]

## 2020-05-07 NOTE — ED Notes (Signed)
Placed external male condom catheter on patient  Gave patient a pillow

## 2020-05-07 NOTE — ED Notes (Signed)
Attempted to ambulate patient in room from bed to sink in room. Pt unstable and required 2 assist.

## 2020-05-08 DIAGNOSIS — U071 COVID-19: Principal | ICD-10-CM

## 2020-05-08 DIAGNOSIS — J1282 Pneumonia due to coronavirus disease 2019: Secondary | ICD-10-CM

## 2020-05-08 DIAGNOSIS — M109 Gout, unspecified: Secondary | ICD-10-CM

## 2020-05-08 DIAGNOSIS — I2510 Atherosclerotic heart disease of native coronary artery without angina pectoris: Secondary | ICD-10-CM

## 2020-05-08 DIAGNOSIS — M4800 Spinal stenosis, site unspecified: Secondary | ICD-10-CM

## 2020-05-08 DIAGNOSIS — I10 Essential (primary) hypertension: Secondary | ICD-10-CM

## 2020-05-08 DIAGNOSIS — F0390 Unspecified dementia without behavioral disturbance: Secondary | ICD-10-CM

## 2020-05-08 DIAGNOSIS — G9349 Other encephalopathy: Secondary | ICD-10-CM

## 2020-05-08 LAB — URINE CULTURE

## 2020-05-08 LAB — MAGNESIUM: Magnesium: 2.3 mg/dL (ref 1.7–2.4)

## 2020-05-08 LAB — D-DIMER, QUANTITATIVE: D-Dimer, Quant: 0.47 ug/mL-FEU (ref 0.00–0.50)

## 2020-05-08 LAB — PHOSPHORUS: Phosphorus: 3.6 mg/dL (ref 2.5–4.6)

## 2020-05-08 LAB — FERRITIN: Ferritin: 244 ng/mL (ref 24–336)

## 2020-05-08 LAB — PROCALCITONIN: Procalcitonin: <0.1 ng/mL

## 2020-05-08 LAB — C-REACTIVE PROTEIN: CRP: 1.8 mg/dL — ABNORMAL HIGH (ref <1.0)

## 2020-05-08 MED ORDER — SODIUM CHLORIDE 0.9 % IV SOLN
100.0000 mg | Freq: Every day | INTRAVENOUS | Status: DC
  Administered 2020-05-09 – 2020-05-10 (×2): 100 mg via INTRAVENOUS
  Filled 2020-05-08 (×2): qty 20

## 2020-05-08 MED ORDER — HYDROXYZINE HCL 50 MG/ML IM SOLN
25.0000 mg | Freq: Once | INTRAMUSCULAR | Status: AC
  Administered 2020-05-08: 25 mg via INTRAMUSCULAR
  Filled 2020-05-08: qty 0.5

## 2020-05-08 MED ORDER — DEXAMETHASONE SODIUM PHOSPHATE 10 MG/ML IJ SOLN
6.0000 mg | Freq: Every day | INTRAMUSCULAR | Status: DC
  Administered 2020-05-08 – 2020-05-09 (×2): 6 mg via INTRAVENOUS
  Filled 2020-05-08 (×2): qty 1

## 2020-05-08 MED ORDER — SODIUM CHLORIDE 0.9 % IV SOLN
200.0000 mg | Freq: Once | INTRAVENOUS | Status: AC
  Administered 2020-05-08: 200 mg via INTRAVENOUS
  Filled 2020-05-08: qty 200

## 2020-05-08 NOTE — Progress Notes (Signed)
PROGRESS NOTE    Benjamin George  [id number] DOB: 12/28/1940 DOA: 05/07/2020 PCP: [name], MD    Brief Narrative:  [name] was admitted to the hospital with a working diagnosis of SARS COVID-19 virus infection.  80 year old male with past medical history for dementia, chronic ambulatory dysfunction due to chronic back pain, status post spinal stimulator 2016, he also has gout, hypertension and dyslipidemia.  At home patient sustained a mechanical fall while getting into the bathroom, he was unable to stand up due to severe weakness.  He was pale, dyspneic and febrile.  EMS was called and he was found hypoxic, placed on supplemental oxygen and transferred to the hospital. On his initial physical examination blood pressure 133/68, heart rate 85, respiratory rate 19, oxygen saturation 91%, his lungs were clear to auscultation bilaterally, heart S1-S2, present rhythmic, soft abdomen, no lower extremity edema.  Sodium 135, potassium 4.5, chloride 104, bicarb 22, glucose 102, BUN 21, creatinine 1.21, white cell count 8.5, hemoglobin 13.1, hematocrit 39.1, platelets 223. SARS COVID-19 positive. Urinalysis specific gravity 1.011, negative leukocytes.  Lumbar spine CT with no acute findings.  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  Neural foraminal stenosis at multiple levels, most significantly on the right at the level of L4-5.  Status post bilateral laminectomy at L4-L5.    Chest radiograph has faint bilateral interstitial infiltrates at bases. EKG 80 bpm, normal axis, first-degree AV block, sinus rhythm, no ST segment or T wave changes.  Assessment & Plan:   Principal Problem:   Pneumonia due to COVID-19 virus Active Problems:   GOUT   Essential hypertension   CAD (coronary artery disease)   Spinal stenosis   Impaired ambulation   Encephalopathy due to COVID-19 virus   Dementia ([place])   1. Pneumonia due to SARS COVID 19 virus infection  RR: 18   Pulse oxymetry: 99%  Fi02: 21% room air.   COVID-19 Labs  Recent Labs    05/07/20 1140 05/08/20 0242  DDIMER 0.44 0.47  FERRITIN 204 244  LDH 112  --   CRP 1.5* 1.8*    Lab Results  Component Value Date   SARSCOV2NAA POSITIVE (A) 05/07/2020   [place] Not Detected 03/16/2019   [place] Not Detected 02/28/2019   [place] NEGATIVE 09/08/2018    Patient with mild elevation in CRP, chest film with faint infiltrates.  Plan for remdesivir (at least 3 doses) and systemic corticosteroids while hospitalized with dexamethasone.   Follow on inflammatory markers and oxygenation. Consult PT and OT, out of bed to chair TID with meals.   2. Acute metabolic encephalopathy in the setting of dementia/ chronic ambulatory dysfunction. Patient has been confused and disorientated but not agitated.  Continue supportive medical care, fall and aspiration precautions.  On memantine   Encourage mobility, follow with PT and OT recommendations.   3. Gout. No clinical signs of exacerbation.   4. Dyslipidemia/ CAD. Continue with aspirin and atorvastatin.  Patient chest pain free.   5. HTN. Blood pressure stable, will continue to hold on antihypertensive medications for now.   Patient continue to be at high risk for worsening viral pneumonia   Status is: Inpatient  Remains inpatient appropriate because:IV treatments appropriate due to intensity of illness or inability to take PO   Dispo: The patient is from: Home              Anticipated d/c is to: Home              Anticipated  d/c date is: 3 days              Patient currently is not medically stable to d/c.   Difficult to place patient No    DVT prophylaxis: Enoxaparin   Code Status:   DNR   Family Communication:  I spoke over the phone with the patient's wife about patient's  condition, plan of care, prognosis and all questions were addressed.    Subjective: Patient confused but not agitated, no nausea or vomiting,  denies any worsening dyspnea or any chest pain.   Objective: Vitals:   05/07/20 1400 05/07/20 1752 05/07/20 1900 05/08/20 0500  BP: (!) 141/60 (!) 133/59 (!) 142/64 136/64  Pulse: 70 80 84 82  Resp: 16 16 18 18   Temp: 98.3 F (36.8 C) 98.2 F (36.8 C) 98.2 F (36.8 C) 98.1 F (36.7 C)  TempSrc: Oral Oral Oral Oral  SpO2: 97% 100% 100% 99%  Weight:      Height:        Intake/Output Summary (Last 24 hours) at 05/08/2020 1309 Last data filed at 05/08/2020 0500 Gross per 24 hour  Intake --  Output 1200 ml  Net -1200 ml   Filed Weights   05/07/20 1208  Weight: 96.3 kg    Examination:   General: Not in pain or dyspnea. Deconditioned  Neurology: Awake and alert, non focal. Disorientated times place and situation.  E ENT: no pallor, no icterus, oral mucosa moist Cardiovascular: No JVD. S1-S2 present, rhythmic, no gallops, rubs, or murmurs. No lower extremity edema. Pulmonary: positive breath sounds bilaterally, with no wheezing, rhonchi or rales. Gastrointestinal. Abdomen soft and non tender Skin. No rashes Musculoskeletal: no joint deformities     Data Reviewed: I have personally reviewed following labs and imaging studies  CBC: Recent Labs  Lab 05/07/20 0433  WBC 8.5  NEUTROABS 6.5  HGB 13.1  HCT 39.1  MCV 91.4  PLT [id number]   Basic Metabolic Panel: Recent Labs  Lab 05/07/20 0433 05/08/20 0242  NA 135  --   K 4.5  --   CL 104  --   CO2 22  --   GLUCOSE 102*  --   BUN 21  --   CREATININE 1.21  --   CALCIUM 8.3*  --   MG  --  2.3  PHOS  --  3.6   GFR: Estimated Creatinine Clearance: 56.7 mL/min (by C-G formula based on SCr of 1.21 mg/dL). Liver Function Tests: Recent Labs  Lab 05/07/20 0433  AST 15  ALT 15  ALKPHOS 90  BILITOT 0.5  PROT 5.9*  ALBUMIN 3.5   No results for input(s): LIPASE, AMYLASE in the last 168 hours. No results for input(s): AMMONIA in the last 168 hours. Coagulation Profile: Recent Labs  Lab 05/07/20 0433  INR 1.1    Cardiac Enzymes: Recent Labs  Lab 05/07/20 0433  CKTOTAL 52   BNP (last 3 results) No results for input(s): PROBNP in the last 8760 hours. HbA1C: No results for input(s): HGBA1C in the last 72 hours. CBG: No results for input(s): GLUCAP in the last 168 hours. Lipid Profile: No results for input(s): CHOL, HDL, LDLCALC, TRIG, CHOLHDL, LDLDIRECT in the last 72 hours. Thyroid Function Tests: No results for input(s): TSH, T4TOTAL, FREET4, T3FREE, THYROIDAB in the last 72 hours. Anemia Panel: Recent Labs    05/07/20 1140 05/08/20 0242  FERRITIN 204 244      Radiology Studies: I have reviewed all of the imaging during this hospital  visit personally     Scheduled Meds: . aspirin EC  81 mg Oral Daily  . atorvastatin  20 mg Oral Daily  . enoxaparin (LOVENOX) injection  40 mg Subcutaneous Q24H  . linaclotide  145 mcg Oral QAC breakfast  . memantine  10 mg Oral BID   Continuous Infusions:   LOS: 1 day        Nirvana Blanchett Gerome Apley, MD

## 2020-05-09 LAB — COMPREHENSIVE METABOLIC PANEL
ALT: 22 U/L (ref 0–44)
AST: 22 U/L (ref 15–41)
Albumin: 3.5 g/dL (ref 3.5–5.0)
Alkaline Phosphatase: 92 U/L (ref 38–126)
Anion gap: 10 (ref 5–15)
BUN: 24 mg/dL — ABNORMAL HIGH (ref 8–23)
CO2: 22 mmol/L (ref 22–32)
Calcium: 8.6 mg/dL — ABNORMAL LOW (ref 8.9–10.3)
Chloride: 100 mmol/L (ref 98–111)
Creatinine, Ser: 1.07 mg/dL (ref 0.61–1.24)
GFR, Estimated: >60 mL/min (ref >60)
Glucose, Bld: 146 mg/dL — ABNORMAL HIGH (ref 70–99)
Potassium: 4.3 mmol/L (ref 3.5–5.1)
Sodium: 132 mmol/L — ABNORMAL LOW (ref 135–145)
Total Bilirubin: 0.6 mg/dL (ref 0.3–1.2)
Total Protein: 6.4 g/dL — ABNORMAL LOW (ref 6.5–8.1)

## 2020-05-09 LAB — D-DIMER, QUANTITATIVE: D-Dimer, Quant: 0.43 ug/mL-FEU (ref 0.00–0.50)

## 2020-05-09 LAB — C-REACTIVE PROTEIN: CRP: 2.4 mg/dL — ABNORMAL HIGH (ref <1.0)

## 2020-05-09 LAB — FERRITIN: Ferritin: 241 ng/mL (ref 24–336)

## 2020-05-09 MED ORDER — METHYLPREDNISOLONE SODIUM SUCC 40 MG IJ SOLR
40.0000 mg | Freq: Two times a day (BID) | INTRAMUSCULAR | Status: DC
  Administered 2020-05-09 – 2020-05-11 (×4): 40 mg via INTRAVENOUS
  Filled 2020-05-09 (×4): qty 1

## 2020-05-09 NOTE — Progress Notes (Signed)
PROGRESS NOTE    Benjamin George  [id number] DOB: 1940/11/10 DOA: 05/07/2020 PCP: [name], MD    Brief Narrative:  Benjamin George was admitted to the hospital with a working diagnosis of SARS COVID-19 virus infection.  80 year old male with past medical history for dementia, chronic ambulatory dysfunction due to chronic back pain, status post spinal stimulator 2016, he also has gout, hypertension and dyslipidemia.  At home patient sustained a mechanical fall while getting into the bathroom, he was unable to stand up due to severe weakness.  He was pale, dyspneic and febrile.  EMS was called and he was found hypoxic, placed on supplemental oxygen and transferred to the hospital. On his initial physical examination blood pressure 133/68, heart rate 85, respiratory rate 19, oxygen saturation 91%, his lungs were clear to auscultation bilaterally, heart S1-S2, present rhythmic, soft abdomen, no lower extremity edema.  Sodium 135, potassium 4.5, chloride 104, bicarb 22, glucose 102, BUN 21, creatinine 1.21, white cell count 8.5, hemoglobin 13.1, hematocrit 39.1, platelets 223. SARS COVID-19 positive. Urinalysis specific gravity 1.011, negative leukocytes.  Lumbar spine CT with no acute findings.  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  Neural foraminal stenosis at multiple levels, most significantly on the right at the level of L4-5.  Status post bilateral laminectomy at L4-L5.    Chest radiograph has faint bilateral interstitial infiltrates at bases. EKG 80 bpm, normal axis, first-degree AV block, sinus rhythm, no ST segment or T wave changes.  Clinically improving, but continue to be very weak and deconditioned.   Assessment & Plan:   Principal Problem:   Pneumonia due to COVID-19 virus Active Problems:   GOUT   Essential hypertension   CAD (coronary artery disease)   Spinal stenosis   Impaired ambulation   Encephalopathy due to COVID-19 virus    Dementia ([place])    1. Pneumonia due to SARS COVID 19 virus infection  RR: 18  Pulse oxymetry: 96%  Fi02: 21% room air.   COVID-19 Labs  Recent Labs    05/07/20 1140 05/08/20 0242 05/09/20 0239  DDIMER 0.44 0.47 0.43  FERRITIN 204 244 241  LDH 112  --   --   CRP 1.5* 1.8* 2.4*    Lab Results  Component Value Date   SARSCOV2NAA POSITIVE (A) 05/07/2020   [place] Not Detected 03/16/2019   [place] Not Detected 02/28/2019   [place] NEGATIVE 09/08/2018    Continue with low oxygen requirements. Positive elevation of CRP. Increased mobility but still needs 24 hrs supervision.   Continue medical therapy with remdesivir and change dexamethasone to methylprednisolone.   Plan for possible dc home in am, if continue to improve.   2. Acute metabolic encephalopathy in the setting of dementia/ chronic ambulatory dysfunction.  His mentation has improved. Positive confusion but not agitation, tolerating po well.  Continue with memantine   Likely patient back to his baseline mentation.   3. Gout. No exacerbation.   4. Dyslipidemia/ CAD. On aspirin and atorvastatin.   5. HTN. Systolic blood pressure 123456 to 143, continue close monitoring and hold on antihypertensive medications.   Status is: Inpatient  Remains inpatient appropriate because:IV treatments appropriate due to intensity of illness or inability to take PO   Dispo: The patient is from: Home              Anticipated d/c is to: Home              Anticipated d/c date is: 1 day  Patient currently is not medically stable to d/c. Possible dc home in am if continue to improve.    Difficult to place patient No    DVT prophylaxis: Enoxaparin   Code Status:   full Family Communication:  I spoke over the phone with the patient's wife about patient's  condition, plan of care, prognosis and all questions were addressed.    Subjective: Patient confused but not agitated, this am he ambulated  with physical therapy, no nausea or vomiting, reports being tired but no frank dyspnea,   Objective: Vitals:   05/07/20 1951 05/08/20 0500 05/08/20 2128 05/09/20 0629  BP: (!) 143/72 136/64 113/60 (!) 128/59  Pulse: 74 82 82 82  Resp: 18 18 18 18   Temp:  98.1 F (36.7 C) 99.8 F (37.7 C) (!) 97.3 F (36.3 C)  TempSrc:  Oral Axillary Oral  SpO2: 96% 99% 94% 96%  Weight:      Height:        Intake/Output Summary (Last 24 hours) at 05/09/2020 1040 Last data filed at 05/09/2020 1003 Gross per 24 hour  Intake 780 ml  Output 450 ml  Net 330 ml   Filed Weights   05/07/20 1208  Weight: 96.3 kg    Examination:   General: Not in pain or dyspnea, deconditioned  Neurology: Awake and alert, non focal. Confused and disorientated.  E ENT: no pallor, no icterus, oral mucosa moist Cardiovascular: No JVD. S1-S2 present, rhythmic, no gallops, rubs, or murmurs. No lower extremity edema. Pulmonary: positive breath sounds bilaterally. Gastrointestinal. Abdomen soft and non tender Skin. No rashes Musculoskeletal: no joint deformities     Data Reviewed: I have personally reviewed following labs and imaging studies  CBC: Recent Labs  Lab 05/07/20 0433  WBC 8.5  NEUTROABS 6.5  HGB 13.1  HCT 39.1  MCV 91.4  PLT 009   Basic Metabolic Panel: Recent Labs  Lab 05/07/20 0433 05/08/20 0242 05/09/20 0239  NA 135  --  132*  K 4.5  --  4.3  CL 104  --  100  CO2 22  --  22  GLUCOSE 102*  --  146*  BUN 21  --  24*  CREATININE 1.21  --  1.07  CALCIUM 8.3*  --  8.6*  MG  --  2.3  --   PHOS  --  3.6  --    GFR: Estimated Creatinine Clearance: 64.1 mL/min (by C-G formula based on SCr of 1.07 mg/dL). Liver Function Tests: Recent Labs  Lab 05/07/20 0433 05/09/20 0239  AST 15 22  ALT 15 22  ALKPHOS 90 92  BILITOT 0.5 0.6  PROT 5.9* 6.4*  ALBUMIN 3.5 3.5   No results for input(s): LIPASE, AMYLASE in the last 168 hours. No results for input(s): AMMONIA in the last 168  hours. Coagulation Profile: Recent Labs  Lab 05/07/20 0433  INR 1.1   Cardiac Enzymes: Recent Labs  Lab 05/07/20 0433  CKTOTAL 52   BNP (last 3 results) No results for input(s): PROBNP in the last 8760 hours. HbA1C: No results for input(s): HGBA1C in the last 72 hours. CBG: No results for input(s): GLUCAP in the last 168 hours. Lipid Profile: No results for input(s): CHOL, HDL, LDLCALC, TRIG, CHOLHDL, LDLDIRECT in the last 72 hours. Thyroid Function Tests: No results for input(s): TSH, T4TOTAL, FREET4, T3FREE, THYROIDAB in the last 72 hours. Anemia Panel: Recent Labs    05/08/20 0242 05/09/20 0239  FERRITIN 244 241      Radiology Studies:  I have reviewed all of the imaging during this hospital visit personally     Scheduled Meds: . aspirin EC  81 mg Oral Daily  . atorvastatin  20 mg Oral Daily  . dexamethasone (DECADRON) injection  6 mg Intravenous Daily  . enoxaparin (LOVENOX) injection  40 mg Subcutaneous Q24H  . linaclotide  145 mcg Oral QAC breakfast  . memantine  10 mg Oral BID   Continuous Infusions: . remdesivir 100 mg in NS 100 mL 100 mg (05/09/20 0930)     LOS: 2 days        Casmer Yepiz Gerome Apley, MD

## 2020-05-09 NOTE — NC FL2 (Signed)
[place] LEVEL OF CARE SCREENING TOOL     IDENTIFICATION  Patient Name: Benjamin George Birthdate: 1941-02-15 Sex: male Admission Date (Current Location): 05/07/2020  [facility] and [place] Number:  Herbalist and Address:  [facility],  [place], [place]      Provider Number: [id number]  Attending Physician Name and Address:  Tawni Millers,*  Relative Name and Phone Number:  wife, [name] Welter @ [phone]    Current Level of Care: Hospital Recommended Level of Care: [place] Prior Approval Number:    Date Approved/Denied:   PASRR Number: [id number]  Discharge Plan: SNF    Current Diagnoses: Patient Active Problem List   Diagnosis Date Noted  . Pneumonia due to COVID-19 virus 05/08/2020  . Encephalopathy due to COVID-19 virus 05/08/2020  . Dementia ([place]) 05/08/2020  . Decreased ambulation status 05/07/2020  . Impaired ambulation 05/07/2020  . Fever   . Weakness generalized   . Hypotension   . CAP (community acquired pneumonia) 05/13/2018  . Community acquired pneumonia of left lower lobe of lung 05/12/2018  . Ischemic cardiomyopathy 04/18/2017  . Memory disorder 12/21/2015  . Spinal stenosis 05/06/2013  . Palpitations 03/02/2013  . Morbid obesity ([place]) 12/12/2011  . Arthritis of shoulder region, 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  . CAD (coronary artery disease) 10/24/2010  . Preoperative evaluation to rule out surgical contraindication 10/24/2010  . Seasonal and perennial allergic rhinitis 05/17/2010  . INSOMNIA 05/17/2010  . Hyperlipidemia LDL goal <70 04/13/2009  . GOUT 04/13/2009  . Obstructive sleep apnea 04/13/2009  . Essential hypertension 04/13/2009  . HEART ATTACK 04/13/2009  . CARDIAC ARREST 04/13/2009    Orientation RESPIRATION BLADDER Height & Weight     Self,Place  Normal Incontinent Weight: 212 lb 4.9 oz (96.3 kg) Height:  5\' 10"  (177.8 cm)  BEHAVIORAL SYMPTOMS/MOOD  NEUROLOGICAL BOWEL NUTRITION STATUS      Continent    AMBULATORY STATUS COMMUNICATION OF NEEDS Skin   Limited Assist Verbally Normal                       Personal Care Assistance Level of Assistance  Bathing,Dressing Bathing Assistance: Limited assistance   Dressing Assistance: Limited assistance     Functional Limitations Info             SPECIAL CARE FACTORS FREQUENCY  PT (By licensed PT),OT (By licensed OT)     PT Frequency: 5x/wk OT Frequency: 5x/wk            Contractures Contractures Info: Not present    Additional Factors Info  Code Status,Allergies,Psychotropic,Isolation Precautions Code Status Info: DNR Allergies Info: see MAR Psychotropic Info: see MAR   Isolation Precautions Info: COVID + 05/07/20     Current Medications (05/09/2020):  This is the current hospital active medication list Current Facility-Administered Medications  Medication Dose Route Frequency Provider Last Rate Last Admin  . acetaminophen (TYLENOL) tablet 650 mg  650 mg Oral Q6H PRN Wynetta Fines T, MD   650 mg at 05/07/20 1959   Or  . acetaminophen (TYLENOL) suppository 650 mg  650 mg Rectal Q6H PRN Wynetta Fines T, MD      . aspirin EC tablet 81 mg  81 mg Oral Daily Wynetta Fines T, MD   81 mg at 05/09/20 0926  . atorvastatin (LIPITOR) tablet 20 mg  20 mg Oral Daily Wynetta Fines T, MD   20 mg at 05/09/20 0926  . enoxaparin (LOVENOX)  injection 40 mg  40 mg Subcutaneous Q24H Wynetta Fines T, MD   40 mg at 05/09/20 2993  . linaclotide (LINZESS) capsule 145 mcg  145 mcg Oral QAC breakfast Wynetta Fines T, MD   145 mcg at 05/08/20 1021  . memantine (NAMENDA) tablet 10 mg  10 mg Oral BID Wynetta Fines T, MD   10 mg at 05/09/20 0926  . methylPREDNISolone sodium succinate (SOLU-MEDROL) 40 mg/mL injection 40 mg  40 mg Intravenous Q12H Arrien, Jimmy Picket, MD      . polyethylene glycol (MIRALAX / GLYCOLAX) packet 17 g  17 g Oral Daily PRN Wynetta Fines T, MD      . remdesivir 100 mg in sodium  chloride 0.9 % 100 mL IVPB  100 mg Intravenous Daily Pham, Anh P, RPH 200 mL/hr at 05/09/20 0930 100 mg at 05/09/20 0930     Discharge Medications: Please see discharge summary for a list of discharge medications.  Relevant Imaging Results:  Relevant Lab Results:   Additional Information [id number]  Lennart Pall, LCSW

## 2020-05-09 NOTE — TOC Initial Note (Signed)
Transition of Care [facility]) - Initial/Assessment Note    Patient Details  Name: Benjamin George MRN: [id number] Date of Birth: 1940/06/14  Transition [facility]) CM/SW Contact:    Lennart Pall, LCSW Phone Number: 05/09/2020, 1:10 PM  Clinical Narrative:                 Contacted pt's wife today to review potential dc plans after speaking with RN and therapies.  Wife reports feeling very overwhelmed at this point and unsure which direction to go with home dc vs SNF.  She is very concerned about her risk of contracting COVID as well as whether pt now requiring more physical assistance than she can provide.  She has spoken with several team members and appreciative of all, however, feeling very uncertain on best dc plan.   We discussed what SNF placement would likely look like with pt being +COVID and wife aware that facility choices will be limited.  Final decision today is to send pt's information out to area SNFs and ask that therapies continue to follow here to see if pt's functional status can improve closer to a supervision level overall.  Have alerted tx team.  Expected Discharge Plan: [place] (vs SNF) Barriers to Discharge: Continued Medical Work up,SNF Covid   Patient Goals and CMS Choice Patient states their goals for this hospitalization and ongoing recovery are:: go home      Expected Discharge Plan and Services Expected Discharge Plan: [place] (vs SNF) In-house Referral: Clinical Social Work     Living arrangements for the past 2 months: Single Family Home                                      Prior Living Arrangements/Services Living arrangements for the past 2 months: Single Family Home Lives with:: Spouse Patient language and need for interpreter reviewed:: Yes        Need for Family Participation in Patient Care: Yes (Comment) Care giver support system in place?: Yes (comment)   Criminal Activity/Legal Involvement  Pertinent to Current Situation/Hospitalization: No - Comment as needed  Activities of Daily Living Home Assistive Devices/Equipment: Walker (specify type) ADL Screening (condition at time of admission) Patient's cognitive ability adequate to safely complete daily activities?: No Is the patient deaf or have difficulty hearing?: No Does the patient have difficulty seeing, even when wearing glasses/contacts?: Yes Does the patient have difficulty concentrating, remembering, or making decisions?: Yes Patient able to express need for assistance with ADLs?: Yes Does the patient have difficulty dressing or bathing?: Yes Independently performs ADLs?: No Communication: Needs assistance Is this a change from baseline?: Pre-admission baseline Dressing (OT): Needs assistance Is this a change from baseline?: Pre-admission baseline Grooming: Needs assistance Is this a change from baseline?: Pre-admission baseline Feeding: Independent Bathing: Needs assistance Is this a change from baseline?: Pre-admission baseline Toileting: Needs assistance Is this a change from baseline?: Pre-admission baseline In/Out Bed: Needs assistance Is this a change from baseline?: Pre-admission baseline Walks in Home: Independent with device (comment) Does the patient have difficulty walking or climbing stairs?: Yes Weakness of Legs: Both Weakness of Arms/Hands: Both  Permission Sought/Granted Permission sought to share information with : Family Supports Permission granted to share information with : Yes, Verbal Permission Granted  Share Information with NAME: wife, Elizeo Rodriques @ [phone]  Emotional Assessment   Attitude/Demeanor/Rapport: Unable to Assess Affect (typically observed): Unable to Assess Orientation: : Oriented to Self Alcohol / Substance Use: Not Applicable Psych Involvement: No (comment)  Admission diagnosis:  Weakness generalized [R53.1] Encephalopathy acute [G93.40] Fever,  unspecified fever cause [R50.9] Impaired ambulation [R26.2] Patient Active Problem List   Diagnosis Date Noted  . Pneumonia due to COVID-19 virus 05/08/2020  . Encephalopathy due to COVID-19 virus 05/08/2020  . Dementia ([place]) 05/08/2020  . Decreased ambulation status 05/07/2020  . Impaired ambulation 05/07/2020  . Fever   . Weakness generalized   . Hypotension   . CAP (community acquired pneumonia) 05/13/2018  . Community acquired pneumonia of left lower lobe of lung 05/12/2018  . Ischemic cardiomyopathy 04/18/2017  . Memory disorder 12/21/2015  . Spinal stenosis 05/06/2013  . Palpitations 03/02/2013  . Morbid obesity ([place]) 12/12/2011  . Arthritis of shoulder region, 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  . CAD (coronary artery disease) 10/24/2010  . Preoperative evaluation to rule out surgical contraindication 10/24/2010  . Seasonal and perennial allergic rhinitis 05/17/2010  . INSOMNIA 05/17/2010  . Hyperlipidemia LDL goal <70 04/13/2009  . GOUT 04/13/2009  . Obstructive sleep apnea 04/13/2009  . Essential hypertension 04/13/2009  . HEART ATTACK 04/13/2009  . CARDIAC ARREST 04/13/2009   PCP:  [name], MD Pharmacy:   [id number] - [place], [place]. AT [address] [place] [address] Phone: [phone] Fax: [phone]     Social Determinants of Health (SDOH) Interventions    Readmission Risk Interventions Readmission Risk Prevention Plan 05/09/2020  Post Dischage Appt Complete  Medication Screening Complete  Transportation Screening Complete  Some recent data might be hidden

## 2020-05-09 NOTE — Progress Notes (Signed)
Occupational Therapy Evaluation  PTA pt lives at home with his wife and is modified independent with mobility @ rollator level. Goes to adult daycare at [employer] 2x/wk. Wife assists with ADL as needed, however pt is typically alone during the day for 2-3 hours. Per wife, pt has had several falls recently and has had to have lifting assistance.  Pt would benefit from being in his familiar environment at home but will need direct assistance with all mobility and ADL tasks initially. If not available, recommend SNF. Will follow acutely.    05/09/20 1300  OT Visit Information  Last OT Received On 05/09/20  Assistance Needed +1  History of Present Illness 80 yo male admitted with COVID, weakness after falling at home. Hx of dementia, gout, back pain-spinal stimulator, ambulatory dysfunction  Precautions  Precautions Fall  Home Living  Family/patient expects to be discharged to: Unsure  Living Arrangements Spouse/significant other  Available Help at Discharge Family  Type of [place] to live on main level with bedroom/bathroom  [place] - 2 wheels;Walker - 4 wheels;Cane - single point  Prior Function  Level of Independence Needs assistance  Gait / Transfers Assistance Needed ambulatory with RW/rollator  ADL's / Homemaking Assistance Needed wife assists as needed; pt goes to the bathroom on his own  Comments goes to adult daycare at [employer] 2x/wk  Communication  Communication No difficulties  Pain Assessment  Pain Assessment Faces  Faces Pain Scale 0  Cognition  Arousal/Alertness Awake/alert  Behavior During Therapy Restless  Overall Cognitive Status Impaired/Different from baseline  Area of Impairment Orientation;Attention;Memory;Following commands;Safety/judgement;Awareness;Problem solving  Orientation Level Disoriented to;Place;Time;Situation  Current Attention Level Sustained  Memory Decreased recall of precautions;Decreased short-term memory   Following Commands Follows one step commands with increased time  Safety/Judgement Decreased awareness of safety;Decreased awareness of deficits  Awareness Intellectual  Problem Solving Slow processing  General Comments unable to remember to use the call bell system; had taped the male purewick on his stomach  Upper Extremity Assessment  Upper Extremity Assessment Generalized weakness  Lower Extremity Assessment  Lower Extremity Assessment Defer to PT evaluation  Cervical / Trunk Assessment  Cervical / Trunk Assessment Other exceptions (hx of back problems)  ADL  Overall ADL's  Needs assistance/impaired  Eating/Feeding Set up  Grooming Set up;Sitting;Supervision/safety  Upper Body Bathing Set up;Supervision/ safety;Sitting  Lower Body Bathing Minimal assistance;Sit to/from stand  Upper Body Dressing  Set up;Sitting  Lower Body Dressing Minimal assistance;Sit to/from Retail buyer Minimal assistance;Ambulation;RW;Grab bars;Comfort height toilet  Toilet Transfer Details (indicate cue type and reason) steady assist on the walk to the toilet; minguard on the walk back from the toilet  Toileting- Clothing Manipulation and Hygiene Moderate assistance  Functional mobility during ADLs Minimal assistance;Rolling walker;Cueing for safety  General ADL Comments 1/4 DOE  Bed Mobility  Overal bed mobility Needs Assistance  Supine to sit Min assist  Transfers  Overall transfer level Needs assistance  Equipment used Rolling walker (2 wheeled)  Transfers Sit to/from Stand  Sit to Stand From elevated surface;Min guard  General transfer comment Close Min guard. Cues required. Increased time.  Balance  Overall balance assessment Needs assistance;History of Falls  Sitting balance-Leahy Scale Fair  Standing balance-Leahy Scale Poor  OT - End of Session  Equipment Utilized During Treatment Rolling walker  Activity Tolerance Patient tolerated treatment well  Patient left in bed;with call  bell/phone within reach;with bed alarm set;Other (comment) (chair position in bed)  Nurse  Communication Mobility status;Other (comment) (encouraeg ambulation)  OT Assessment  OT Recommendation/Assessment Patient needs continued OT Services  OT Visit Diagnosis Unsteadiness on feet (R26.81);Other abnormalities of gait and mobility (R26.89);Muscle weakness (generalized) (M62.81);History of falling (Z91.81);Other symptoms and signs involving cognitive function  OT Plan  OT Frequency (ACUTE ONLY) Min 2X/week  OT Treatment/Interventions (ACUTE ONLY) Self-care/ADL training;Therapeutic exercise;Energy conservation;DME and/or AE instruction;Therapeutic activities;Cognitive remediation/compensation;Patient/family education;Balance training  AM-PAC OT "6 Clicks" Daily Activity Outcome Measure (Version 2)  Help from another person eating meals? 3  Help from another person taking care of personal grooming? 3  Help from another person toileting, which includes using toliet, bedpan, or urinal? 2  Help from another person bathing (including washing, rinsing, drying)? 3  Help from another person to put on and taking off regular upper body clothing? 3  Help from another person to put on and taking off regular lower body clothing? 3  6 Click Score 17  OT Recommendation  Follow Up Recommendations SNF;Supervision/Assistance - 24 hour;Home health OT (will need 24/7 A to go home safely)  OT Equipment 3 in 1 bedside commode  Individuals Consulted  Consulted and Agree with Results and Recommendations Patient;Family member/caregiver  Family Member Consulted wife via phone  Acute Rehab OT Goals  Patient Stated Goal to go home  OT Goal Formulation With patient/family  Time For Goal Achievement 05/23/20  Potential to Achieve Goals Good  OT Time Calculation  OT Start Time (ACUTE ONLY) 1154  OT Stop Time (ACUTE ONLY) 1240  OT Time Calculation (min) 46 min  OT General Charges  $OT Visit 1 Visit  OT Evaluation   $OT Eval Moderate Complexity 1 Mod  OT Treatments  $Self Care/Home Management  23-37 mins  Written Expression  Dominant Hand Right  Maurie Boettcher, OT/L   Acute OT Clinical Specialist [place] Pager [phone] Office [phone]

## 2020-05-09 NOTE — Evaluation (Signed)
Physical Therapy Evaluation Patient Details Name: Benjamin George MRN: [id number] DOB: [birth year] Today's Date: 05/09/2020   History of Present Illness  80 yo male admitted with COVID, weakness after falling at home. Hx of dementia, gout, back pain-spinal stimulator, ambulatory dysfunction  Clinical Impression  On eval, pt required Min guard-Min assist for mobility. He walked ~15 feet around the room with use of his RW. He tolerated activity well. No LOB or knee buckling observed. He was very pleasant and participated well. D/c plan will depend on family's decision. Unsure if he is quite back to his baseline. Family was not present at time of eval. Will continue to follow and progress activity as tolerated.     Follow Up Recommendations SNF vs Home health PT;Supervision/Assistance - 24 hour (will depend on wife's decision. If she feels comfortable caring for him at current level, then home should be fine)    Equipment Recommendations  None recommended by PT    Recommendations for Other Services       Precautions / Restrictions Precautions Precautions: Fall Restrictions Weight Bearing Restrictions: No      Mobility  Bed Mobility Overal bed mobility: Needs Assistance Bed Mobility: Supine to Sit;Sit to Supine     Supine to sit: Min guard;HOB elevated Sit to supine: Min assist   General bed mobility comments: Some assistance for LEs back onto bed. Increased time. Cues required.    Transfers Overall transfer level: Needs assistance Equipment used: Rolling walker (2 wheeled) Transfers: Sit to/from Stand Sit to Stand: From elevated surface;Min guard         General transfer comment: Close Min guard. Cues required. Increased time.  Ambulation/Gait Ambulation/Gait assistance: Min guard Gait Distance (Feet): 15 Feet Assistive device: Rolling walker (2 wheeled) Gait Pattern/deviations: Step-through pattern;Decreased stride length     General Gait Details: Close Min  guard. Cues required. Some difficulty navigating cluttered, tight space. No knee buckling or LOB with RW use.  Stairs            Wheelchair Mobility    Modified Rankin (Stroke Patients Only)       Balance Overall balance assessment: Needs assistance;History of Falls         Standing balance support: Bilateral upper extremity supported Standing balance-Leahy Scale: Poor                               Pertinent Vitals/Pain Pain Assessment: Faces Faces Pain Scale: No hurt    Home Living Family/patient expects to be discharged to:: Unsure Living Arrangements: Spouse/significant other Available Help at Discharge: Family Type of Home: House       Home Layout: Able to live on main level with bedroom/bathroom Home Equipment: [place] - 2 wheels;Walker - 4 wheels;Cane - single point      Prior Function Level of Independence: Needs assistance   Gait / Transfers Assistance Needed: ambulatory with RW  ADL's / Homemaking Assistance Needed: likely needed some assist or supervision        Hand Dominance        Extremity/Trunk Assessment   Upper Extremity Assessment Upper Extremity Assessment: Defer to OT evaluation    Lower Extremity Assessment Lower Extremity Assessment: Generalized weakness    Cervical / Trunk Assessment Cervical / Trunk Assessment: Normal  Communication   Communication: No difficulties  Cognition Arousal/Alertness: Awake/alert Behavior During Therapy: WFL for tasks assessed/performed Overall Cognitive Status: History of cognitive impairments - at baseline  General Comments: follows 1 step commands well with increased time      General Comments      Exercises General Exercises - Lower Extremity Hip Flexion/Marching: AROM;Both;5 reps;Standing   Assessment/Plan    PT Assessment Patient needs continued PT services  PT Problem List Decreased strength;Decreased  mobility;Decreased activity tolerance;Decreased balance;Decreased cognition;Decreased safety awareness       PT Treatment Interventions DME instruction;Gait training;Therapeutic activities;Therapeutic exercise;Patient/family education;Balance training;Functional mobility training    PT Goals (Current goals can be found in the Care Plan section)  Acute Rehab PT Goals Patient Stated Goal: home PT Goal Formulation: Patient unable to participate in goal setting Time For Goal Achievement: 05/23/20 Potential to Achieve Goals: Good    Frequency Min 3X/week   Barriers to discharge        Co-evaluation               AM-PAC PT "6 Clicks" Mobility  Outcome Measure Help needed turning from your back to your side while in a flat bed without using bedrails?: A Little Help needed moving from lying on your back to sitting on the side of a flat bed without using bedrails?: A Little Help needed moving to and from a bed to a chair (including a wheelchair)?: A Little Help needed standing up from a chair using your arms (e.g., wheelchair or bedside chair)?: A Little Help needed to walk in hospital room?: A Little Help needed climbing 3-5 steps with a railing? : A Lot 6 Click Score: 17    End of Session Equipment Utilized During Treatment: Gait belt Activity Tolerance: Patient tolerated treatment well Patient left: in bed;with call bell/phone within reach;with bed alarm set;with nursing/sitter in room   PT Visit Diagnosis: Muscle weakness (generalized) (M62.81);Unsteadiness on feet (R26.81);History of falling (Z91.81)    Time: 2683-4196 PT Time Calculation (min) (ACUTE ONLY): 28 min   Charges:   PT Evaluation $PT Eval Moderate Complexity: 1 Mod PT Treatments $Gait Training: 8-22 mins          Doreatha Massed, PT Acute Rehabilitation  Office: [phone] Pager: [phone]

## 2020-05-10 DIAGNOSIS — G9349 Other encephalopathy: Secondary | ICD-10-CM

## 2020-05-10 LAB — FERRITIN: Ferritin: 263 ng/mL (ref 24–336)

## 2020-05-10 LAB — COMPREHENSIVE METABOLIC PANEL
ALT: 20 U/L (ref 0–44)
AST: 18 U/L (ref 15–41)
Albumin: 3.5 g/dL (ref 3.5–5.0)
Alkaline Phosphatase: 88 U/L (ref 38–126)
Anion gap: 10 (ref 5–15)
BUN: 31 mg/dL — ABNORMAL HIGH (ref 8–23)
CO2: 22 mmol/L (ref 22–32)
Calcium: 8.6 mg/dL — ABNORMAL LOW (ref 8.9–10.3)
Chloride: 102 mmol/L (ref 98–111)
Creatinine, Ser: 1.15 mg/dL (ref 0.61–1.24)
GFR, Estimated: >60 mL/min (ref >60)
Glucose, Bld: 153 mg/dL — ABNORMAL HIGH (ref 70–99)
Potassium: 4.3 mmol/L (ref 3.5–5.1)
Sodium: 134 mmol/L — ABNORMAL LOW (ref 135–145)
Total Bilirubin: 0.4 mg/dL (ref 0.3–1.2)
Total Protein: 6.2 g/dL — ABNORMAL LOW (ref 6.5–8.1)

## 2020-05-10 LAB — C-REACTIVE PROTEIN: CRP: 1.2 mg/dL — ABNORMAL HIGH (ref <1.0)

## 2020-05-10 LAB — D-DIMER, QUANTITATIVE: D-Dimer, Quant: 0.32 ug/mL-FEU (ref 0.00–0.50)

## 2020-05-10 MED ORDER — DIPHENHYDRAMINE HCL 25 MG PO CAPS
25.0000 mg | ORAL_CAPSULE | Freq: Once | ORAL | Status: AC
  Administered 2020-05-10: 25 mg via ORAL
  Filled 2020-05-10: qty 1

## 2020-05-10 NOTE — TOC Progression Note (Signed)
Transition of Care [facility]) - Progression Note    Patient Details  Name: Benjamin George MRN: [id number] Date of Birth: 09-Jul-1940  [facility] CM/SW Contact  Lennart Pall, LCSW Phone Number: 05/10/2020, 3:06 PM  Clinical Narrative:     Have received SNF bed offer from [facility] and wife has accepted. Plan for admission tomorrow.  Starting insurance auth.  Expected Discharge Plan: [place] (vs SNF) Barriers to Discharge: Continued Medical Work up,SNF Covid  Expected Discharge Plan and Services Expected Discharge Plan: [place] (vs SNF) In-house Referral: Clinical Social Work     Living arrangements for the past 2 months: Single Family Home                                       Social Determinants of Health (SDOH) Interventions    Readmission Risk Interventions Readmission Risk Prevention Plan 05/09/2020  Post Dischage Appt Complete  Medication Screening Complete  Transportation Screening Complete  Some recent data might be hidden

## 2020-05-10 NOTE — Care Management Important Message (Signed)
Important Message  Patient Details IM Letter placed in Patient's door [place]. Name: ELIBERTO SOLE MRN: [id number] Date of Birth: 17-Aug-1940   Medicare Important Message Given:  Yes     Kerin Salen 05/10/2020, 10:29 AM

## 2020-05-10 NOTE — Discharge Summary (Addendum)
Physician Discharge Summary  Benjamin George XHB:[id number] DOB: 1940-05-15 DOA: 05/07/2020  PCP: [name], MD  Admit date: 05/07/2020 Discharge date: 05/10/2020  Admitted From: home  Disposition:   SNF   Recommendations for Outpatient Follow-up and new medication changes:  1. Follow up with Dr. Maudie Mercury in 2 weeks.  I spoke over the phone with the patient's wife about patient's  condition, plan of care, prognosis and all questions were addressed.  Home Health: na   Equipment/Devices: na    Discharge Condition: stable  CODE STATUS:  DNR  Diet recommendation: heart healthy    Brief/Interim Summary: [name] was admitted to the hospital with a working diagnosis of SARS COVID-19 viral pneumonia.   80 year old male with past medical history for dementia, chronic ambulatory dysfunction due to chronic back pain, status post spinal stimulator 2016, he also has gout, hypertension and dyslipidemia. At home patient sustained a mechanical fall while getting into the bathroom, he was unable to stand up due to severe weakness. He was pale, dyspneic and febrile. EMS was called and he was found hypoxic, placed on supplemental oxygen and transferred to the hospital. On his initial physical examination blood pressure 133/68, heart rate 85, respiratory rate 19, oxygen saturation 91%, his lungs were clear to auscultation bilaterally, heart S1-S2, present rhythmic, soft abdomen, no lower extremity edema.  Sodium 135, potassium 4.5, chloride 104, bicarb 22, glucose 102, BUN 21, creatinine 1.21, white cell count 8.5, hemoglobin 13.1, hematocrit 39.1, platelets 223. SARS COVID-19 positive. Urinalysis specific gravity 1.011, negative leukocytes.  Lumbar spine CT with no acute findings. 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 Neural foraminal stenosis at multiple levels, most significantly on the right at the level of L4-5. Status post bilateral laminectomy at L4-L5.   Chest  radiograph had faint bilateral interstitial infiltrates at bases. EKG 80 bpm, normal axis, first-degree AV block, sinus rhythm, no ST segment or T wave changes.  Patient placed on medical therapy with systemic corticosteroids and remdesivir with good toleration.   Clinically improving, but continue to be very weak and deconditioned  Plan to transfer to SNF to continue his recovery.   1.  Pneumonia due to SARS COVID-19 viral infection Patient was admitted to the medical ward, he received medical therapy with systemic corticosteroids and remdesivir. His symptoms, inflammatory markers and oximetry improved.  COVID-19 Labs  Recent Labs    05/08/20 0242 05/09/20 0239 05/10/20 0230  DDIMER 0.47 0.43 0.32  FERRITIN 244 241 263  CRP 1.8* 2.4* 1.2*    Lab Results  Component Value Date   SARSCOV2NAA POSITIVE (A) 05/07/2020   SARSCOV2NAA Not Detected 03/16/2019   [place] Not Detected 02/28/2019   [place] NEGATIVE 09/08/2018   He was seen by physical therapy/Occupational Therapy and was recommended transfer to SNF to continue his recovery.  His steroids will be discontinued discharge.  2.  Acute metabolic encephalopathy viral encephalopathy in the setting of dementia, and chronic ambulatory dysfunction.  Patient's mentation on admission was worse than his baseline.  Slowly with medical therapy he has improved. He continues to be very weak and deconditioned. Continue memantine.  3.  Gout.  No signs of exacerbation.  4.  Dyslipidemia/coronary artery disease.  Continue aspirin and atorvastatin.  5.  Hypertension.  His blood pressure remained well controlled during his hospitalization.  6.  Obesity class I, obstructive sleep apnea.  His calculated BMI is 30.4, continue CPAP at discharge.  Patient seen and examined at bedside this morning, no specific complaints.  Plan  to discharge to [facility] in [place] for further rehabilitation.  No changes in plan of care.  Discharge  Diagnoses:  Principal Problem:   Pneumonia due to COVID-19 virus Active Problems:   GOUT   Essential hypertension   CAD (coronary artery disease)   Spinal stenosis   Impaired ambulation   Encephalopathy due to COVID-19 virus   Dementia [facility])    Discharge Instructions   Allergies as of 05/10/2020      Reactions   Codeine Nausea And Vomiting, Other (See Comments)   Hydrocodone Nausea And Vomiting   Aricept [donepezil Hcl]    Nausea   Morphine Sulfate Other (See Comments)   Oxycodone Hcl Other (See Comments)   Dizzy and uneasy   Pregabalin Other (See Comments)   Methocarbamol Nausea Only, Other (See Comments)   Other Nausea Only, Other (See Comments)   vibramyacin      Medication List    TAKE these medications   aspirin 81 MG tablet Take 81 mg by mouth daily.   atorvastatin 20 MG tablet Commonly known as: LIPITOR TAKE 1 TABLET BY MOUTH EVERY DAY   linaclotide 145 MCG Caps capsule Commonly known as: LINZESS Take 145 mcg by mouth in the morning and at bedtime.   memantine 10 MG tablet Commonly known as: NAMENDA TAKE 1 TABLET BY MOUTH TWICE A DAY   multivitamin tablet Take 1 tablet by mouth every morning.   Nucynta 50 MG tablet Generic drug: tapentadol Take 50 mg by mouth in the morning, at noon, and at bedtime.   polyethylene glycol 17 g packet Commonly known as: MIRALAX / GLYCOLAX Take 17 g by mouth daily as needed for mild constipation. Uses it every other day- depending on bowel movements.   prednisoLONE acetate 1 % ophthalmic suspension Commonly known as: PRED FORTE Place 1 drop into the left eye daily.       Contact information for after-discharge care    [facility] .   Service: Skilled Nursing Contact information: [address] [address] [phone]                 Allergies  Allergen Reactions  . Codeine Nausea And Vomiting and Other (See Comments)  . Hydrocodone  Nausea And Vomiting  . Aricept [Donepezil Hcl]     Nausea  . Morphine Sulfate Other (See Comments)  . Oxycodone Hcl Other (See Comments)    Dizzy and uneasy  . Pregabalin Other (See Comments)  . Methocarbamol Nausea Only and Other (See Comments)  . Other Nausea Only and Other (See Comments)    vibramyacin        Procedures/Studies: CT LUMBAR SPINE W CONTRAST  Result Date: 05/07/2020 CLINICAL DATA:  Low back pain. Sudden generalized weakness. Infection suspected. EXAM: CT LUMBAR SPINE WITH CONTRAST TECHNIQUE: Multidetector CT imaging of the lumbar spine was performed with intravenous contrast administration. CONTRAST:  147mL OMNIPAQUE IOHEXOL 300 MG/ML  SOLN COMPARISON:  CT abdomen and pelvis dated 12/28/2019 FINDINGS: Segmentation: 5 lumbar type vertebrae. Alignment: Moderate dextroscoliosis of the upper lumbar spine. No evidence of acute vertebral body subluxation. Vertebrae: No fracture line or displaced fracture fragment. No evidence of acute compression fracture deformity. No acute appearing cortical irregularity or osseous lesion. No destructive changes to suggest osteomyelitis. Paraspinal and other soft tissues: Paravertebral soft tissues are unremarkable. No prevertebral or paravertebral fluid collection or edema. Extensive aortic atherosclerosis. 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 These degenerative changes are not significantly changed in appearance compared to the MRI lumbar spine of 12/04/2019 or CT abdomen of 12/28/2019. Surgical changes of bilateral laminectomy at L4-5 with commensurate central canal decompression. The degenerative facet hypertrophy is causing moderate to severe RIGHT neural foramen stenosis with probable associated nerve root impingement. Moderate degrees of neural foramen stenosis are also appreciated at the L3-4 and L5-S1 levels. Mild to  moderate central canal stenoses are again appreciated at each level of the lumbar spine, better demonstrated on the earlier MRI, not significantly changed. Nerve root stimulator in place with tip above the level of today's exam. IMPRESSION: 1. No acute findings. No evidence of paravertebral fluid collection or soft tissue inflammation. No CT evidence of abscess within the central canal. No evidence of osteomyelitis. 2. 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 Associated neural foramen stenoses are again seen at multiple levels, again most significant on the RIGHT at the L4-5 level, with probable associated nerve root impingement at 1 or more levels. 3. Surgical changes of bilateral laminectomy at L4-5 with commensurate central canal decompression. Aortic Atherosclerosis (ICD10-I70.0). Electronically Signed   By: Franki Cabot M.D.   On: 05/07/2020 09:50   DG Chest Port 1 View  Result Date: 05/07/2020 CLINICAL DATA:  Questionable sepsis EXAM: PORTABLE CHEST 1 VIEW COMPARISON:  05/12/2018 FINDINGS: Indistinct opacity at the lung bases. Normal heart size and mediastinal contours for technique. No visible effusion or pneumothorax. IMPRESSION: Mild atelectasis or infiltrates at the lung bases. Electronically Signed   By: Monte Fantasia M.D.   On: 05/07/2020 05:18        Subjective: Patient is feeling better, his mentation has improved but continue to be very weak and deconditioned.   Discharge Exam: Vitals:   05/10/20 0622 05/10/20 1302  BP: 131/71 (!) 148/71  Pulse: 80 79  Resp: 17 16  Temp: 97.6 F (36.4 C) (!) 97.5 F (36.4 C)  SpO2: 99% 99%   Vitals:   05/09/20 1300 05/09/20 2059 05/10/20 0622 05/10/20 1302  BP: 102/62 (!) 146/79 131/71 (!) 148/71  Pulse: 86 75 80 79  Resp:  17 17 16   Temp: (!) 97.5 F (36.4 C) 98 F (36.7 C) 97.6  F (36.4 C) (!) 97.5 F (36.4 C)  TempSrc: Axillary  Oral   SpO2: 96% 95% 99% 99%  Weight:      Height:        General: Not in pain or dyspnea.  Neurology: Awake and alert, non focal. Positive confusion but not agitation.   E ENT: no pallor, no icterus, oral mucosa moist Cardiovascular: No JVD. S1-S2 present, rhythmic, no gallops, rubs, or murmurs. No lower extremity edema. Pulmonary: positive breath sounds bilaterally, Gastrointestinal. Abdomen soft and non tender Skin. No rashes Musculoskeletal: no joint deformities   The results of significant diagnostics from this hospitalization (including imaging, microbiology, ancillary and laboratory) are listed below for reference.     Microbiology: Recent Results (from the past 240 hour(s))  Blood culture (routine single)     Status: None (Preliminary result)   Collection Time: 05/07/20  4:33 AM   Specimen: BLOOD  Result Value Ref Range Status   Specimen Description   Final    BLOOD BLOOD RIGHT FOREARM Performed at [facility] [address], [place], [place] [address]  Special Requests   Final    BOTTLES DRAWN AEROBIC AND ANAEROBIC Blood Culture results may not be optimal due to an inadequate volume of blood received in culture bottles Performed at [facility] [address], [place], [place] [address]    Culture   Final    NO GROWTH 2 DAYS Performed at [place] [address], [place], [place] [address]    Report Status PENDING  Incomplete  Resp Panel by RT-PCR (Flu A&B, Covid)     Status: Abnormal   Collection Time: 05/07/20  4:34 AM  Result Value Ref Range Status   SARS Coronavirus 2 by RT PCR POSITIVE (A) NEGATIVE Final    Comment: RESULT CALLED TO, READ BACK BY AND VERIFIED WITH: SAVOIE,B. RN AT [id number] 05/07/20 MULLINS,T (NOTE) SARS-CoV-2 target nucleic acids are DETECTED.  The SARS-CoV-2 RNA is generally detectable in upper respiratory specimens during the acute  phase of infection. Positive results are indicative of the presence of the identified virus, but do not rule out bacterial infection or co-infection with other pathogens not detected by the test. Clinical correlation with patient history and other diagnostic information is necessary to determine patient infection status. The expected result is Negative.  Fact Sheet for Patients: EntrepreneurPulse.com.au  Fact Sheet for Healthcare Providers: IncredibleEmployment.be  This test is not yet approved or cleared by the [place] FDA and  has been authorized for detection and/or diagnosis of SARS-CoV-2 by FDA under an Emergency Use Authorization (EUA).  This EUA will remain in effect (meaning this test  can be used) for the duration of  the COVID-19 declaration under Section 564(b)(1) of the Act, 21 U.S.C. section 360bbb-3(b)(1), unless the authorization is terminated or revoked sooner.     Influenza A by PCR NEGATIVE NEGATIVE Final   Influenza B by PCR NEGATIVE NEGATIVE Final    Comment: (NOTE) The Xpert Xpress SARS-CoV-2/FLU/RSV plus assay is intended as an aid in the diagnosis of influenza from Nasopharyngeal swab specimens and should not be used as a sole basis for treatment. Nasal washings and aspirates are unacceptable for Xpert Xpress SARS-CoV-2/FLU/RSV testing.  Fact Sheet for Patients: EntrepreneurPulse.com.au  Fact Sheet for Healthcare Providers: IncredibleEmployment.be  This test is not yet approved or cleared by the [place] FDA and has been authorized for detection and/or diagnosis of SARS-CoV-2 by FDA under an Emergency Use Authorization (EUA). This EUA will remain in effect (meaning this test can be used) for the duration of the COVID-19 declaration under Section 564(b)(1) of the Act, 21 U.S.C. section 360bbb-3(b)(1), unless the authorization is terminated or revoked.  Performed at  [facility], [place] [address], [place], [place] [address]   Urine culture     Status: Abnormal   Collection Time: 05/07/20  4:40 AM   Specimen: In/Out Cath Urine  Result Value Ref Range Status   Specimen Description   Final    IN/OUT CATH URINE Performed at [facility] [address] [place], [place] [address]    Special Requests   Final    NONE Performed at [facility], [place] [address] [place], [place] [address]    Culture MULTIPLE SPECIES PRESENT, SUGGEST RECOLLECTION (A)  Final   Report Status 05/08/2020 FINAL  Final     Labs: BNP (last 3 results) Recent Labs    05/07/20 1140  BNP 0000000*   Basic Metabolic Panel: Recent Labs  Lab 05/07/20 0433 05/08/20 0242 05/09/20 0239 05/10/20 0230  NA  135  --  132* 134*  K 4.5  --  4.3 4.3  CL 104  --  100 102  CO2 22  --  22 22  GLUCOSE 102*  --  146* 153*  BUN 21  --  24* 31*  CREATININE 1.21  --  1.07 1.15  CALCIUM 8.3*  --  8.6* 8.6*  MG  --  2.3  --   --   PHOS  --  3.6  --   --    Liver Function Tests: Recent Labs  Lab 05/07/20 0433 05/09/20 0239 05/10/20 0230  AST 15 22 18   ALT 15 22 20   ALKPHOS 90 92 88  BILITOT 0.5 0.6 0.4  PROT 5.9* 6.4* 6.2*  ALBUMIN 3.5 3.5 3.5   No results for input(s): LIPASE, AMYLASE in the last 168 hours. No results for input(s): AMMONIA in the last 168 hours. CBC: Recent Labs  Lab 05/07/20 0433  WBC 8.5  NEUTROABS 6.5  HGB 13.1  HCT 39.1  MCV 91.4  PLT 223   Cardiac Enzymes: Recent Labs  Lab 05/07/20 0433  CKTOTAL 52   BNP: Invalid input(s): POCBNP CBG: No results for input(s): GLUCAP in the last 168 hours. D-Dimer Recent Labs    05/09/20 0239 05/10/20 0230  DDIMER 0.43 0.32   Hgb A1c No results for input(s): HGBA1C in the last 72 hours. Lipid Profile No results for input(s): CHOL, HDL, LDLCALC, TRIG, CHOLHDL, LDLDIRECT in the last 72 hours. Thyroid function studies No results for input(s):  TSH, T4TOTAL, T3FREE, THYROIDAB in the last 72 hours.  Invalid input(s): FREET3 Anemia work up Recent Labs    05/09/20 0239 05/10/20 0230  FERRITIN 241 263   Urinalysis    Component Value Date/Time   COLORURINE YELLOW 05/07/2020 0440   APPEARANCEUR CLEAR 05/07/2020 0440   LABSPEC 1.011 05/07/2020 0440   PHURINE 6.0 05/07/2020 0440   GLUCOSEU NEGATIVE 05/07/2020 0440   HGBUR NEGATIVE 05/07/2020 0440   BILIRUBINUR NEGATIVE 05/07/2020 0440   KETONESUR NEGATIVE 05/07/2020 0440   PROTEINUR NEGATIVE 05/07/2020 0440   UROBILINOGEN 0.2 04/27/2013 0940   NITRITE NEGATIVE 05/07/2020 0440   LEUKOCYTESUR NEGATIVE 05/07/2020 0440   Sepsis Labs Invalid input(s): PROCALCITONIN,  WBC,  LACTICIDVEN Microbiology Recent Results (from the past 240 hour(s))  Blood culture (routine single)     Status: None (Preliminary result)   Collection Time: 05/07/20  4:33 AM   Specimen: BLOOD  Result Value Ref Range Status   Specimen Description   Final    BLOOD BLOOD RIGHT FOREARM Performed at [facility], [place] [address] [place], [place] [address]    Special Requests   Final    BOTTLES DRAWN AEROBIC AND ANAEROBIC Blood Culture results may not be optimal due to an inadequate volume of blood received in culture bottles Performed at [facility] [address], [place], [place] [address]    Culture   Final    NO GROWTH 2 DAYS Performed at [facility] [address] [place], [place] [address]    Report Status PENDING  Incomplete  Resp Panel by RT-PCR (Flu A&B, Covid)     Status: Abnormal   Collection Time: 05/07/20  4:34 AM  Result Value Ref Range Status   SARS Coronavirus 2 by RT PCR POSITIVE (A) NEGATIVE Final    Comment: RESULT CALLED TO, READ BACK BY AND VERIFIED WITH: SAVOIE,B. RN AT [id number] 05/07/20 MULLINS,T (NOTE) SARS-CoV-2 target nucleic acids are DETECTED.  The SARS-CoV-2  RNA is generally detectable in upper respiratory specimens during  the acute phase of infection. Positive results are indicative of the presence of the identified virus, but do not rule out bacterial infection or co-infection with other pathogens not detected by the test. Clinical correlation with patient history and other diagnostic information is necessary to determine patient infection status. The expected result is Negative.  Fact Sheet for Patients: EntrepreneurPulse.com.au  Fact Sheet for Healthcare Providers: IncredibleEmployment.be  This test is not yet approved or cleared by the [place] FDA and  has been authorized for detection and/or diagnosis of SARS-CoV-2 by FDA under an Emergency Use Authorization (EUA).  This EUA will remain in effect (meaning this test  can be used) for the duration of  the COVID-19 declaration under Section 564(b)(1) of the Act, 21 U.S.C. section 360bbb-3(b)(1), unless the authorization is terminated or revoked sooner.     Influenza A by PCR NEGATIVE NEGATIVE Final   Influenza B by PCR NEGATIVE NEGATIVE Final    Comment: (NOTE) The Xpert Xpress SARS-CoV-2/FLU/RSV plus assay is intended as an aid in the diagnosis of influenza from Nasopharyngeal swab specimens and should not be used as a sole basis for treatment. Nasal washings and aspirates are unacceptable for Xpert Xpress SARS-CoV-2/FLU/RSV testing.  Fact Sheet for Patients: EntrepreneurPulse.com.au  Fact Sheet for Healthcare Providers: IncredibleEmployment.be  This test is not yet approved or cleared by the [place] FDA and has been authorized for detection and/or diagnosis of SARS-CoV-2 by FDA under an Emergency Use Authorization (EUA). This EUA will remain in effect (meaning this test can be used) for the duration of the COVID-19 declaration under Section 564(b)(1) of the Act, 21 U.S.C. section 360bbb-3(b)(1), unless the authorization is terminated  or revoked.  Performed at [facility], [place] [address], [place], [place] [address]   Urine culture     Status: Abnormal   Collection Time: 05/07/20  4:40 AM   Specimen: In/Out Cath Urine  Result Value Ref Range Status   Specimen Description   Final    IN/OUT CATH URINE Performed at [facility] [address], [place], [place] [address]    Special Requests   Final    NONE Performed at [facility], [place] [address], [place], [place] [address]    Culture MULTIPLE SPECIES PRESENT, SUGGEST RECOLLECTION (A)  Final   Report Status 05/08/2020 FINAL  Final     Time coordinating discharge: 45 minutes  SIGNED:   Tawni Millers, MD  [facility] 05/10/2020, 3:56 PM

## 2020-05-10 NOTE — Progress Notes (Signed)
Physical Therapy Treatment Patient Details Name: Benjamin George MRN: [id number] DOB: 1940-12-30 Today's Date: 05/10/2020    History of Present Illness 80 yo male admitted with COVID, weakness after falling at home. Hx of dementia, gout, back pain-spinal stimulator, ambulatory dysfunction    PT Comments    Pt participated fairly well. He appears more restless today. Fixated on his belongings (clothing, shoes, etc). Continue to recommend SNF.    Follow Up Recommendations  SNF     Equipment Recommendations  None recommended by PT    Recommendations for Other Services       Precautions / Restrictions Precautions Precautions: Fall Restrictions Weight Bearing Restrictions: No    Mobility  Bed Mobility Overal bed mobility: Needs Assistance Bed Mobility: Supine to Sit     Supine to sit: Min assist     General bed mobility comments: Assist for trunk. Increased time. Cues required.  Transfers Overall transfer level: Needs assistance Equipment used: Rolling walker (2 wheeled) Transfers: Sit to/from Stand Sit to Stand: Min guard;From elevated surface         General transfer comment: Close Min guard. Cues required. Increased time.  Ambulation/Gait Ambulation/Gait assistance: Min guard Gait Distance (Feet): 15 Feet (x2) Assistive device: Rolling walker (2 wheeled) Gait Pattern/deviations: Step-through pattern;Decreased stride length     General Gait Details: walked x 2 in room. Min guard. cues required   Stairs             Wheelchair Mobility    Modified Rankin (Stroke Patients Only)       Balance Overall balance assessment: Needs assistance;History of Falls         Standing balance support: Bilateral upper extremity supported Standing balance-Leahy Scale: Poor                              Cognition Arousal/Alertness: Awake/alert Behavior During Therapy: Restless Overall Cognitive Status: Impaired/Different from baseline Area of  Impairment: Orientation;Attention;Memory;Following commands;Safety/judgement;Awareness;Problem solving                 Orientation Level: Disoriented to;Place;Time;Situation   Memory: Decreased recall of precautions;Decreased short-term memory Following Commands: Follows one step commands with increased time Safety/Judgement: Decreased awareness of safety;Decreased awareness of deficits            Exercises      General Comments        Pertinent Vitals/Pain Pain Assessment: Faces Faces Pain Scale: No hurt    Home Living                      Prior Function            PT Goals (current goals can now be found in the care plan section) Progress towards PT goals: Progressing toward goals    Frequency    Min 3X/week      PT Plan Current plan remains appropriate    Co-evaluation              AM-PAC PT "6 Clicks" Mobility   Outcome Measure  Help needed turning from your back to your side while in a flat bed without using bedrails?: A Little Help needed moving from lying on your back to sitting on the side of a flat bed without using bedrails?: A Little Help needed moving to and from a bed to a chair (including a wheelchair)?: A Little Help needed standing up from a chair using your arms (e.g.,  wheelchair or bedside chair)?: A Little Help needed to walk in hospital room?: A Little Help needed climbing 3-5 steps with a railing? : A Lot 6 Click Score: 17    End of Session Equipment Utilized During Treatment: Gait belt Activity Tolerance: Patient tolerated treatment well Patient left: in bed;with call bell/phone within reach;with bed alarm set   PT Visit Diagnosis: Muscle weakness (generalized) (M62.81);Unsteadiness on feet (R26.81);History of falling (Z91.81)     Time: 5537-4827 PT Time Calculation (min) (ACUTE ONLY): 31 min  Charges:  $Gait Training: 23-37 mins              Doreatha Massed, PT Acute Rehabilitation  Office:  [phone] Pager: [phone]

## 2020-05-11 ENCOUNTER — Ambulatory Visit: Payer: Medicare Other | Admitting: Internal Medicine

## 2020-05-11 DIAGNOSIS — G4733 Obstructive sleep apnea (adult) (pediatric): Secondary | ICD-10-CM | POA: Diagnosis not present

## 2020-05-11 DIAGNOSIS — J1281 Pneumonia due to SARS-associated coronavirus: Secondary | ICD-10-CM | POA: Diagnosis not present

## 2020-05-11 DIAGNOSIS — R0902 Hypoxemia: Secondary | ICD-10-CM | POA: Diagnosis not present

## 2020-05-11 DIAGNOSIS — G9341 Metabolic encephalopathy: Secondary | ICD-10-CM | POA: Diagnosis not present

## 2020-05-11 DIAGNOSIS — R262 Difficulty in walking, not elsewhere classified: Secondary | ICD-10-CM | POA: Diagnosis not present

## 2020-05-11 DIAGNOSIS — E785 Hyperlipidemia, unspecified: Secondary | ICD-10-CM | POA: Diagnosis not present

## 2020-05-11 DIAGNOSIS — M4800 Spinal stenosis, site unspecified: Secondary | ICD-10-CM | POA: Diagnosis not present

## 2020-05-11 DIAGNOSIS — Z7409 Other reduced mobility: Secondary | ICD-10-CM | POA: Diagnosis not present

## 2020-05-11 DIAGNOSIS — Z743 Need for continuous supervision: Secondary | ICD-10-CM | POA: Diagnosis not present

## 2020-05-11 DIAGNOSIS — J1282 Pneumonia due to coronavirus disease 2019: Secondary | ICD-10-CM | POA: Diagnosis not present

## 2020-05-11 DIAGNOSIS — Z7401 Bed confinement status: Secondary | ICD-10-CM | POA: Diagnosis not present

## 2020-05-11 DIAGNOSIS — M109 Gout, unspecified: Secondary | ICD-10-CM | POA: Diagnosis not present

## 2020-05-11 DIAGNOSIS — R488 Other symbolic dysfunctions: Secondary | ICD-10-CM | POA: Diagnosis not present

## 2020-05-11 DIAGNOSIS — M2550 Pain in unspecified joint: Secondary | ICD-10-CM | POA: Diagnosis not present

## 2020-05-11 DIAGNOSIS — U071 COVID-19: Secondary | ICD-10-CM | POA: Diagnosis not present

## 2020-05-11 DIAGNOSIS — I10 Essential (primary) hypertension: Secondary | ICD-10-CM | POA: Diagnosis not present

## 2020-05-11 DIAGNOSIS — I2510 Atherosclerotic heart disease of native coronary artery without angina pectoris: Secondary | ICD-10-CM | POA: Diagnosis not present

## 2020-05-11 DIAGNOSIS — M6281 Muscle weakness (generalized): Secondary | ICD-10-CM | POA: Diagnosis not present

## 2020-05-11 LAB — COMPREHENSIVE METABOLIC PANEL
ALT: 34 U/L (ref 0–44)
AST: 27 U/L (ref 15–41)
Albumin: 3.4 g/dL — ABNORMAL LOW (ref 3.5–5.0)
Alkaline Phosphatase: 80 U/L (ref 38–126)
Anion gap: 12 (ref 5–15)
BUN: 25 mg/dL — ABNORMAL HIGH (ref 8–23)
CO2: 22 mmol/L (ref 22–32)
Calcium: 8.5 mg/dL — ABNORMAL LOW (ref 8.9–10.3)
Chloride: 101 mmol/L (ref 98–111)
Creatinine, Ser: 0.96 mg/dL (ref 0.61–1.24)
GFR, Estimated: >60 mL/min (ref >60)
Glucose, Bld: 150 mg/dL — ABNORMAL HIGH (ref 70–99)
Potassium: 4.3 mmol/L (ref 3.5–5.1)
Sodium: 135 mmol/L (ref 135–145)
Total Bilirubin: 0.3 mg/dL (ref 0.3–1.2)
Total Protein: 6.1 g/dL — ABNORMAL LOW (ref 6.5–8.1)

## 2020-05-11 LAB — FERRITIN: Ferritin: 321 ng/mL (ref 24–336)

## 2020-05-11 LAB — C-REACTIVE PROTEIN: CRP: 0.7 mg/dL (ref <1.0)

## 2020-05-11 LAB — D-DIMER, QUANTITATIVE: D-Dimer, Quant: 0.31 ug/mL-FEU (ref 0.00–0.50)

## 2020-05-11 NOTE — Progress Notes (Signed)
Occupational Therapy Treatment Patient Details Name: Benjamin George MRN: [id number] DOB: 1940/05/31 Today's Date: 05/11/2020    History of present illness 80 yo male admitted with COVID, weakness after falling at home. Hx of dementia, gout, back pain-spinal stimulator, ambulatory dysfunction   OT comments  Treatment focused on functional mobility and performing self care tasks out of bed. Patient mod assist to transfer to side of bed and min guard to stand from elevated bed height to transfer to recliner. Patient performed grooming task and feeding seated in recliner. Initially patient sleepy and drowsy and reluctant to participate but improved participation with waking up.   Follow Up Recommendations  SNF;Supervision/Assistance - 24 hour;Home health OT    Equipment Recommendations  3 in 1 bedside commode    Recommendations for Other Services      Precautions / Restrictions Precautions Precautions: Fall Restrictions Weight Bearing Restrictions: No       Mobility Bed Mobility Overal bed mobility: Needs Assistance Bed Mobility: Supine to Sit     Supine to sit: Mod assist;HOB elevated     General bed mobility comments: Mod assist for trunk negotiation.  Transfers Overall transfer level: Needs assistance Equipment used: Rolling walker (2 wheeled) Transfers: Sit to/from [facility] Sit to Stand: Min guard;From elevated surface Stand pivot transfers: Min guard       General transfer comment: Min guard for standing with RW from elevated surface - struggled from low bed height. Min guard with RW to take steps to recliner.    Balance Overall balance assessment: Mild deficits observed, not formally tested                                         ADL either performed or assessed with clinical judgement   ADL Overall ADL's : Needs assistance/impaired Eating/Feeding: Set up Eating/Feeding Details (indicate cue type and reason): set up for  self feeding in recliner Grooming: Set up;Sitting;Supervision/safety;Wash/dry face;Wash/dry hands Grooming Details (indicate cue type and reason): set up for grooming in recliner                                     Vision Patient Visual Report: No change from baseline Vision Assessment?: No apparent visual deficits   Perception     Praxis      Cognition Arousal/Alertness: Awake/alert Behavior During Therapy: WFL for tasks assessed/performed Overall Cognitive Status: No family/caregiver present to determine baseline cognitive functioning                                          Exercises     Shoulder Instructions       General Comments      Pertinent Vitals/ Pain       Pain Assessment: No/denies pain  Home Living                                          Prior Functioning/Environment              Frequency  Min 2X/week        Progress Toward Goals  OT Goals(current goals can  now be found in the care plan section)  Progress towards OT goals: Progressing toward goals  Acute Rehab OT Goals OT Goal Formulation: With patient/family Time For Goal Achievement: 05/23/20 Potential to Achieve Goals: Good  Plan Discharge plan remains appropriate    Co-evaluation                 AM-PAC OT "6 Clicks" Daily Activity     Outcome Measure   Help from another person eating meals?: A Little Help from another person taking care of personal grooming?: A Little Help from another person toileting, which includes using toliet, bedpan, or urinal?: A Lot Help from another person bathing (including washing, rinsing, drying)?: A Little Help from another person to put on and taking off regular upper body clothing?: A Little Help from another person to put on and taking off regular lower body clothing?: A Little 6 Click Score: 17    End of Session Equipment Utilized During Treatment: Rolling walker  OT Visit  Diagnosis: Unsteadiness on feet (R26.81);Other abnormalities of gait and mobility (R26.89);Muscle weakness (generalized) (M62.81);History of falling (Z91.81);Other symptoms and signs involving cognitive function   Activity Tolerance Patient tolerated treatment well   Patient Left in chair;with call bell/phone within reach;with chair alarm set   Nurse Communication Mobility status        Time: 0354-6568 OT Time Calculation (min): 16 min  Charges: OT General Charges $OT Visit: 1 Visit OT Treatments $Therapeutic Activity: 8-22 mins  Derl Barrow, OTR/L [place]  Office [phone] Pager: [place] 05/11/2020, 11:07 AM

## 2020-05-11 NOTE — Progress Notes (Signed)
[facility]

## 2020-05-11 NOTE — Progress Notes (Signed)
Pt ready for discharge to [facility], report called and all questions answered. Awaiting ems for transport of pt. Pt transported with all his belongings, cpap machine and other materials wife dropped off. Pt has his glasses on. Discharge packet sent with pt via ems driver.

## 2020-05-11 NOTE — TOC Transition Note (Signed)
Transition of Care [facility]) - CM/SW Discharge Note   Patient Details  Name: Benjamin George MRN: [id number] Date of Birth: [birth year]  Transition of Care [facility]) CM/SW Contact:  Lennart Pall, LCSW Phone Number: 05/11/2020, 9:55 AM   Clinical Narrative:    Pt medically cleared for dc today to SNF,.  Insurance auth received (ref # [id number]  2/2-2/4).  [facility] to provide transport.  RN to call report to [phone].  No further TOC needs.   Final next level of care: Skilled Nursing Facility Barriers to Discharge: Barriers Resolved   Patient Goals and CMS Choice Patient states their goals for this hospitalization and ongoing recovery are:: go home      Discharge Placement   Existing PASRR number confirmed : 05/09/20          Patient chooses bed at: [facility] Patient to be transferred to facility by: [place] Name of family member notified: wife, [name] Patient and family notified of of transfer: 05/11/20  Discharge Plan and Services In-house Referral: Clinical Social Work              DME Arranged: N/A DME Agency: NA                  Social Determinants of Health ([place]) Interventions     Readmission Risk Interventions Readmission Risk Prevention Plan 05/09/2020  Post Dischage Appt Complete  Medication Screening Complete  Transportation Screening Complete  Some recent data might be hidden

## 2020-05-12 DIAGNOSIS — U071 COVID-19: Secondary | ICD-10-CM | POA: Diagnosis not present

## 2020-05-12 DIAGNOSIS — M109 Gout, unspecified: Secondary | ICD-10-CM | POA: Diagnosis not present

## 2020-05-12 DIAGNOSIS — I10 Essential (primary) hypertension: Secondary | ICD-10-CM | POA: Diagnosis not present

## 2020-05-12 DIAGNOSIS — I2510 Atherosclerotic heart disease of native coronary artery without angina pectoris: Secondary | ICD-10-CM | POA: Diagnosis not present

## 2020-05-12 DIAGNOSIS — G4733 Obstructive sleep apnea (adult) (pediatric): Secondary | ICD-10-CM | POA: Diagnosis not present

## 2020-05-12 DIAGNOSIS — J1282 Pneumonia due to coronavirus disease 2019: Secondary | ICD-10-CM | POA: Diagnosis not present

## 2020-05-12 DIAGNOSIS — G9341 Metabolic encephalopathy: Secondary | ICD-10-CM | POA: Diagnosis not present

## 2020-05-12 DIAGNOSIS — E785 Hyperlipidemia, unspecified: Secondary | ICD-10-CM | POA: Diagnosis not present

## 2020-05-12 LAB — CULTURE, BLOOD (SINGLE): Culture: NO GROWTH

## 2020-05-23 DIAGNOSIS — I10 Essential (primary) hypertension: Secondary | ICD-10-CM | POA: Diagnosis not present

## 2020-05-23 DIAGNOSIS — G9341 Metabolic encephalopathy: Secondary | ICD-10-CM | POA: Diagnosis not present

## 2020-05-23 DIAGNOSIS — I2510 Atherosclerotic heart disease of native coronary artery without angina pectoris: Secondary | ICD-10-CM | POA: Diagnosis not present

## 2020-05-23 DIAGNOSIS — E785 Hyperlipidemia, unspecified: Secondary | ICD-10-CM | POA: Diagnosis not present

## 2020-05-23 DIAGNOSIS — M109 Gout, unspecified: Secondary | ICD-10-CM | POA: Diagnosis not present

## 2020-05-23 DIAGNOSIS — G4733 Obstructive sleep apnea (adult) (pediatric): Secondary | ICD-10-CM | POA: Diagnosis not present

## 2020-05-23 DIAGNOSIS — U071 COVID-19: Secondary | ICD-10-CM | POA: Diagnosis not present

## 2020-05-23 DIAGNOSIS — J1282 Pneumonia due to coronavirus disease 2019: Secondary | ICD-10-CM | POA: Diagnosis not present

## 2020-05-26 DIAGNOSIS — U071 COVID-19: Secondary | ICD-10-CM | POA: Diagnosis not present

## 2020-05-26 DIAGNOSIS — E785 Hyperlipidemia, unspecified: Secondary | ICD-10-CM | POA: Diagnosis not present

## 2020-05-26 DIAGNOSIS — M6281 Muscle weakness (generalized): Secondary | ICD-10-CM | POA: Diagnosis not present

## 2020-05-26 DIAGNOSIS — I2510 Atherosclerotic heart disease of native coronary artery without angina pectoris: Secondary | ICD-10-CM | POA: Diagnosis not present

## 2020-05-26 DIAGNOSIS — M109 Gout, unspecified: Secondary | ICD-10-CM | POA: Diagnosis not present

## 2020-05-26 DIAGNOSIS — J1281 Pneumonia due to SARS-associated coronavirus: Secondary | ICD-10-CM | POA: Diagnosis not present

## 2020-05-26 DIAGNOSIS — Z7409 Other reduced mobility: Secondary | ICD-10-CM | POA: Diagnosis not present

## 2020-05-26 DIAGNOSIS — G9349 Other encephalopathy: Secondary | ICD-10-CM | POA: Diagnosis not present

## 2020-05-26 DIAGNOSIS — M4800 Spinal stenosis, site unspecified: Secondary | ICD-10-CM | POA: Diagnosis not present

## 2020-06-02 ENCOUNTER — Ambulatory Visit: Payer: Medicare Other | Admitting: Internal Medicine

## 2020-06-07 DIAGNOSIS — M6281 Muscle weakness (generalized): Secondary | ICD-10-CM | POA: Diagnosis not present

## 2020-06-07 DIAGNOSIS — U071 COVID-19: Secondary | ICD-10-CM | POA: Diagnosis not present

## 2020-06-07 DIAGNOSIS — G9349 Other encephalopathy: Secondary | ICD-10-CM | POA: Diagnosis not present

## 2020-06-07 DIAGNOSIS — I2510 Atherosclerotic heart disease of native coronary artery without angina pectoris: Secondary | ICD-10-CM | POA: Diagnosis not present

## 2020-06-07 DIAGNOSIS — M109 Gout, unspecified: Secondary | ICD-10-CM | POA: Diagnosis not present

## 2020-06-07 DIAGNOSIS — M4800 Spinal stenosis, site unspecified: Secondary | ICD-10-CM | POA: Diagnosis not present

## 2020-06-07 DIAGNOSIS — J1281 Pneumonia due to SARS-associated coronavirus: Secondary | ICD-10-CM | POA: Diagnosis not present

## 2020-06-07 DIAGNOSIS — E785 Hyperlipidemia, unspecified: Secondary | ICD-10-CM | POA: Diagnosis not present

## 2020-06-07 DIAGNOSIS — Z7409 Other reduced mobility: Secondary | ICD-10-CM | POA: Diagnosis not present

## 2020-06-09 DIAGNOSIS — R7303 Prediabetes: Secondary | ICD-10-CM | POA: Diagnosis not present

## 2020-06-09 DIAGNOSIS — E7800 Pure hypercholesterolemia, unspecified: Secondary | ICD-10-CM | POA: Diagnosis not present

## 2020-06-09 DIAGNOSIS — R413 Other amnesia: Secondary | ICD-10-CM | POA: Diagnosis not present

## 2020-06-09 DIAGNOSIS — R410 Disorientation, unspecified: Secondary | ICD-10-CM | POA: Diagnosis not present

## 2020-06-11 DIAGNOSIS — U071 COVID-19: Secondary | ICD-10-CM | POA: Diagnosis not present

## 2020-06-11 DIAGNOSIS — I2510 Atherosclerotic heart disease of native coronary artery without angina pectoris: Secondary | ICD-10-CM | POA: Diagnosis not present

## 2020-06-11 DIAGNOSIS — M109 Gout, unspecified: Secondary | ICD-10-CM | POA: Diagnosis not present

## 2020-06-11 DIAGNOSIS — G9349 Other encephalopathy: Secondary | ICD-10-CM | POA: Diagnosis not present

## 2020-06-11 DIAGNOSIS — M4800 Spinal stenosis, site unspecified: Secondary | ICD-10-CM | POA: Diagnosis not present

## 2020-06-11 DIAGNOSIS — Z7409 Other reduced mobility: Secondary | ICD-10-CM | POA: Diagnosis not present

## 2020-06-11 DIAGNOSIS — E785 Hyperlipidemia, unspecified: Secondary | ICD-10-CM | POA: Diagnosis not present

## 2020-06-11 DIAGNOSIS — J1281 Pneumonia due to SARS-associated coronavirus: Secondary | ICD-10-CM | POA: Diagnosis not present

## 2020-06-11 DIAGNOSIS — M6281 Muscle weakness (generalized): Secondary | ICD-10-CM | POA: Diagnosis not present

## 2020-06-14 DIAGNOSIS — G9349 Other encephalopathy: Secondary | ICD-10-CM | POA: Diagnosis not present

## 2020-06-14 DIAGNOSIS — I2510 Atherosclerotic heart disease of native coronary artery without angina pectoris: Secondary | ICD-10-CM | POA: Diagnosis not present

## 2020-06-14 DIAGNOSIS — M4800 Spinal stenosis, site unspecified: Secondary | ICD-10-CM | POA: Diagnosis not present

## 2020-06-14 DIAGNOSIS — M6281 Muscle weakness (generalized): Secondary | ICD-10-CM | POA: Diagnosis not present

## 2020-06-14 DIAGNOSIS — E785 Hyperlipidemia, unspecified: Secondary | ICD-10-CM | POA: Diagnosis not present

## 2020-06-14 DIAGNOSIS — U071 COVID-19: Secondary | ICD-10-CM | POA: Diagnosis not present

## 2020-06-14 DIAGNOSIS — M109 Gout, unspecified: Secondary | ICD-10-CM | POA: Diagnosis not present

## 2020-06-14 DIAGNOSIS — J1281 Pneumonia due to SARS-associated coronavirus: Secondary | ICD-10-CM | POA: Diagnosis not present

## 2020-06-14 DIAGNOSIS — Z7409 Other reduced mobility: Secondary | ICD-10-CM | POA: Diagnosis not present

## 2020-06-17 ENCOUNTER — Emergency Department (HOSPITAL_COMMUNITY): Payer: Medicare Other

## 2020-06-17 ENCOUNTER — Observation Stay (HOSPITAL_COMMUNITY)
Admission: EM | Admit: 2020-06-17 | Discharge: 2020-06-18 | Disposition: A | Discharge status: Home / Self Care | Payer: Medicare Other | Attending: Internal Medicine | Admitting: Internal Medicine

## 2020-06-17 ENCOUNTER — Encounter (HOSPITAL_COMMUNITY): Payer: Self-pay

## 2020-06-17 DIAGNOSIS — R4182 Altered mental status, unspecified: Principal | ICD-10-CM | POA: Diagnosis present

## 2020-06-17 DIAGNOSIS — Z7982 Long term (current) use of aspirin: Secondary | ICD-10-CM | POA: Diagnosis not present

## 2020-06-17 DIAGNOSIS — Z20822 Contact with and (suspected) exposure to covid-19: Secondary | ICD-10-CM | POA: Insufficient documentation

## 2020-06-17 DIAGNOSIS — I4430 Unspecified atrioventricular block: Secondary | ICD-10-CM | POA: Diagnosis not present

## 2020-06-17 DIAGNOSIS — I2510 Atherosclerotic heart disease of native coronary artery without angina pectoris: Secondary | ICD-10-CM | POA: Insufficient documentation

## 2020-06-17 DIAGNOSIS — I4891 Unspecified atrial fibrillation: Secondary | ICD-10-CM | POA: Diagnosis not present

## 2020-06-17 DIAGNOSIS — Z8616 Personal history of COVID-19: Secondary | ICD-10-CM | POA: Insufficient documentation

## 2020-06-17 DIAGNOSIS — Z79899 Other long term (current) drug therapy: Secondary | ICD-10-CM | POA: Insufficient documentation

## 2020-06-17 DIAGNOSIS — I119 Hypertensive heart disease without heart failure: Secondary | ICD-10-CM | POA: Insufficient documentation

## 2020-06-17 DIAGNOSIS — F0390 Unspecified dementia without behavioral disturbance: Secondary | ICD-10-CM | POA: Insufficient documentation

## 2020-06-17 DIAGNOSIS — Z96612 Presence of left artificial shoulder joint: Secondary | ICD-10-CM | POA: Insufficient documentation

## 2020-06-17 DIAGNOSIS — I252 Old myocardial infarction: Secondary | ICD-10-CM | POA: Insufficient documentation

## 2020-06-17 DIAGNOSIS — Z87891 Personal history of nicotine dependence: Secondary | ICD-10-CM | POA: Diagnosis not present

## 2020-06-17 DIAGNOSIS — Z955 Presence of coronary angioplasty implant and graft: Secondary | ICD-10-CM | POA: Insufficient documentation

## 2020-06-17 DIAGNOSIS — R5383 Other fatigue: Secondary | ICD-10-CM | POA: Diagnosis not present

## 2020-06-17 DIAGNOSIS — Z743 Need for continuous supervision: Secondary | ICD-10-CM | POA: Diagnosis not present

## 2020-06-17 DIAGNOSIS — I10 Essential (primary) hypertension: Secondary | ICD-10-CM | POA: Diagnosis not present

## 2020-06-17 DIAGNOSIS — T402X5A Adverse effect of other opioids, initial encounter: Secondary | ICD-10-CM | POA: Diagnosis not present

## 2020-06-17 DIAGNOSIS — R6889 Other general symptoms and signs: Secondary | ICD-10-CM | POA: Diagnosis not present

## 2020-06-17 DIAGNOSIS — T50905A Adverse effect of unspecified drugs, medicaments and biological substances, initial encounter: Secondary | ICD-10-CM

## 2020-06-17 DIAGNOSIS — G9340 Encephalopathy, unspecified: Secondary | ICD-10-CM | POA: Diagnosis not present

## 2020-06-17 DIAGNOSIS — I499 Cardiac arrhythmia, unspecified: Secondary | ICD-10-CM | POA: Diagnosis not present

## 2020-06-17 LAB — URINALYSIS, ROUTINE W REFLEX MICROSCOPIC
Bilirubin Urine: NEGATIVE
Glucose, UA: NEGATIVE mg/dL
Hgb urine dipstick: NEGATIVE
Ketones, ur: NEGATIVE mg/dL
Leukocytes,Ua: NEGATIVE
Nitrite: NEGATIVE
Protein, ur: NEGATIVE mg/dL
Specific Gravity, Urine: 1.016 (ref 1.005–1.030)
pH: 6 (ref 5.0–8.0)

## 2020-06-17 LAB — ETHANOL: Alcohol, Ethyl (B): <10 mg/dL (ref <10)

## 2020-06-17 LAB — COMPREHENSIVE METABOLIC PANEL
ALT: 16 U/L (ref 0–44)
AST: 17 U/L (ref 15–41)
Albumin: 3.9 g/dL (ref 3.5–5.0)
Alkaline Phosphatase: 76 U/L (ref 38–126)
Anion gap: 7 (ref 5–15)
BUN: 22 mg/dL (ref 8–23)
CO2: 28 mmol/L (ref 22–32)
Calcium: 9 mg/dL (ref 8.9–10.3)
Chloride: 103 mmol/L (ref 98–111)
Creatinine, Ser: 0.91 mg/dL (ref 0.61–1.24)
GFR, Estimated: >60 mL/min (ref >60)
Glucose, Bld: 103 mg/dL — ABNORMAL HIGH (ref 70–99)
Potassium: 4.7 mmol/L (ref 3.5–5.1)
Sodium: 138 mmol/L (ref 135–145)
Total Bilirubin: 0.6 mg/dL (ref 0.3–1.2)
Total Protein: 6.5 g/dL (ref 6.5–8.1)

## 2020-06-17 LAB — CBC WITH DIFFERENTIAL/PLATELET
Abs Immature Granulocytes: 0.1 10*3/uL — ABNORMAL HIGH (ref 0.00–0.07)
Basophils Absolute: 0 10*3/uL (ref 0.0–0.1)
Basophils Relative: 0 %
Eosinophils Absolute: 0 10*3/uL (ref 0.0–0.5)
Eosinophils Relative: 0 %
HCT: 40.5 % (ref 39.0–52.0)
Hemoglobin: 13.3 g/dL (ref 13.0–17.0)
Immature Granulocytes: 1 %
Lymphocytes Relative: 21 %
Lymphs Abs: 1.7 10*3/uL (ref 0.7–4.0)
MCH: 30.1 pg (ref 26.0–34.0)
MCHC: 32.8 g/dL (ref 30.0–36.0)
MCV: 91.6 fL (ref 80.0–100.0)
Monocytes Absolute: 0.7 10*3/uL (ref 0.1–1.0)
Monocytes Relative: 8 %
Neutro Abs: 5.8 10*3/uL (ref 1.7–7.7)
Neutrophils Relative %: 70 %
Platelets: 278 10*3/uL (ref 150–400)
RBC: 4.42 MIL/uL (ref 4.22–5.81)
RDW: 12.9 % (ref 11.5–15.5)
WBC: 8.3 10*3/uL (ref 4.0–10.5)
nRBC: 0 % (ref 0.0–0.2)

## 2020-06-17 LAB — ACETAMINOPHEN LEVEL: Acetaminophen (Tylenol), Serum: <10 ug/mL — ABNORMAL LOW (ref 10–30)

## 2020-06-17 LAB — BLOOD GAS, VENOUS
Acid-Base Excess: 2.3 mmol/L — ABNORMAL HIGH (ref 0.0–2.0)
Bicarbonate: 29.2 mmol/L — ABNORMAL HIGH (ref 20.0–28.0)
O2 Saturation: 29.1 %
Patient temperature: 98.6
pCO2, Ven: 57.9 mmHg (ref 44.0–60.0)
pH, Ven: 7.324 (ref 7.250–7.430)
pO2, Ven: <31 mmHg — CL (ref 32.0–45.0)

## 2020-06-17 LAB — RAPID URINE DRUG SCREEN, HOSP PERFORMED
Amphetamines: NOT DETECTED
Barbiturates: NOT DETECTED
Benzodiazepines: NOT DETECTED
Cocaine: NOT DETECTED
Opiates: NOT DETECTED
Tetrahydrocannabinol: NOT DETECTED

## 2020-06-17 LAB — SALICYLATE LEVEL: Salicylate Lvl: <7 mg/dL — ABNORMAL LOW (ref 7.0–30.0)

## 2020-06-17 LAB — AMMONIA: Ammonia: 26 umol/L (ref 9–35)

## 2020-06-17 LAB — CBG MONITORING, ED: Glucose-Capillary: 104 mg/dL — ABNORMAL HIGH (ref 70–99)

## 2020-06-17 MED ORDER — NALOXONE HCL 0.4 MG/ML IJ SOLN
0.4000 mg | Freq: Once | INTRAMUSCULAR | Status: AC
  Administered 2020-06-17: 0.4 mg via INTRAVENOUS
  Filled 2020-06-17: qty 1

## 2020-06-17 MED ORDER — LORAZEPAM 2 MG/ML IJ SOLN
1.0000 mg | Freq: Once | INTRAMUSCULAR | Status: AC
  Administered 2020-06-17: 1 mg via INTRAVENOUS
  Filled 2020-06-17: qty 1

## 2020-06-17 MED ORDER — HALOPERIDOL LACTATE 5 MG/ML IJ SOLN
5.0000 mg | Freq: Once | INTRAMUSCULAR | Status: AC
  Administered 2020-06-17: 5 mg via INTRAVENOUS
  Filled 2020-06-17: qty 1

## 2020-06-17 MED ORDER — NALOXONE HCL 4 MG/0.1ML NA LIQD
1.0000 | Freq: Once | NASAL | Status: AC
  Administered 2020-06-17: 1 via NASAL
  Filled 2020-06-17: qty 4

## 2020-06-17 NOTE — ED Triage Notes (Signed)
Family reports pt being difficult to arouse. Wife denies any recent medication changes. Unsure if pt took medications while wife was out of the house today.

## 2020-06-17 NOTE — ED Provider Notes (Signed)
[facility] DEPT Provider Note   CSN: [id number] Arrival date & time: 06/17/20  1350     History Chief Complaint  Patient presents with  . Fatigue    Benjamin George is a 80 y.o. male with PMH/o Afib, HLD, HTN, brought in by EMS for evaluation of somnolence, fatigue.  Patient with a history of dementia and is at baseline alert and oriented x1.  Wife went on to run errands this morning.  Unclear when exactly last seen normal was.  She returned home about 1230 and noticed that patient was sitting on the couch and was difficult to arouse, prompting EMS call.  She did report that she left patient's medication, including Nucynta, out for him to take.  EMS reports that patient was alert and oriented x1 and would have to have constant verbal stimuli in order to be awake.   EM LEVEL 5 CAVEAT DUE TO AMS  The history is provided by the patient.       Past Medical History:  Diagnosis Date  . Arthritis   . Atrial fibrillation ([place])   . Cardiac arrest ([place])   . Complication of anesthesia   . Gout   . Gout   . Heart attack ([place])   . Heart disease   . HLD (hyperlipidemia)   . HTN (hypertension)   . Memory disorder 12/21/2015  . PONV (postoperative nausea and vomiting)   . Sleep apnea    NPSG 10/26/07 - AHI 16.4 used5 yrs, uses CPAP nightly    Patient Active Problem List   Diagnosis Date Noted  . Pneumonia due to COVID-19 virus 05/08/2020  . Encephalopathy due to COVID-19 virus 05/08/2020  . Dementia ([place]) 05/08/2020  . Decreased ambulation status 05/07/2020  . Impaired ambulation 05/07/2020  . Fever   . Weakness generalized   . Hypotension   . CAP (community acquired pneumonia) 05/13/2018  . Community acquired pneumonia of left lower lobe of lung 05/12/2018  . Ischemic cardiomyopathy 04/18/2017  . Memory disorder 12/21/2015  . Spinal stenosis 05/06/2013  . Palpitations 03/02/2013  . Morbid obesity ([place]) 12/12/2011  . Arthritis of shoulder  region, 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  . CAD (coronary artery disease) 10/24/2010  . Preoperative evaluation to rule out surgical contraindication 10/24/2010  . Seasonal and perennial allergic rhinitis 05/17/2010  . INSOMNIA 05/17/2010  . Hyperlipidemia LDL goal <70 04/13/2009  . GOUT 04/13/2009  . Obstructive sleep apnea 04/13/2009  . Essential hypertension 04/13/2009  . HEART ATTACK 04/13/2009  . CARDIAC ARREST 04/13/2009    Past Surgical History:  Procedure Laterality Date  . angioplasty    . APPENDECTOMY    . CAROTID STENT    . CARPECTOMY Left 07/22/2017   Procedure: LEFT WRIST HARDWARE REMOVAL AND PROXIMAL ROW CARPECTOMY;  Surgeon: Milly Jakob, MD;  Location: [place];  Service: Orthopedics;  Laterality: Left;  . CORONARY ANGIOPLASTY WITH STENT PLACEMENT  06/27/2007   L main OK, LAD mild irreg, CFX stent OK, irreg, RCA 60% calcified, EF 45%   . HAND SURGERY     left   . HARDWARE REMOVAL Left 07/22/2017   Procedure: HARDWARE REMOVAL;  Surgeon: Milly Jakob, MD;  Location: [place];  Service: Orthopedics;  Laterality: Left;  . LUMBAR LAMINECTOMY/DECOMPRESSION MICRODISCECTOMY N/A 05/06/2013   Procedure: Lumbar 4-5 decompression    1 LEVEL;  Surgeon: Sinclair Ship, MD;  Location: [place];  Service: Orthopedics;  Laterality: N/A;  Lumbar 4-5 decompression  . nasal septopalsty    .  ORIF SCAPHOID FRACTURE Left 01/21/2017   Procedure: OPEN TREATMENT OF LEFT SCAPHOID FRACTURE;  Surgeon: Milly Jakob, MD;  Location: [facility];  Service: Orthopedics;  Laterality: Left;  . TOTAL SHOULDER ARTHROPLASTY  04/17/2011   Procedure: TOTAL SHOULDER ARTHROPLASTY; left Surgeon: Nita Sells, MD;  Location: [place];  Service: Orthopedics;  Laterality: Left;       Family History  Problem Relation Age of Onset  . Heart attack Father   . Heart disease Father   . Hyperlipidemia Father   . Other Father        [name]  . Alzheimer's  disease Mother   . Colon cancer Neg Hx   . Esophageal cancer Neg Hx   . Rectal cancer Neg Hx   . Stomach cancer Neg Hx     Social History   Tobacco Use  . Smoking status: Former Smoker    Packs/day: 1.50    Years: 30.00    Pack years: 45.00    Types: Cigarettes    Quit date: 04/28/1987    Years since quitting: 33.1  . Smokeless tobacco: Never Used  . Tobacco comment: smoked 2 ppd for 32 years; quit in 1989  Vaping Use  . Vaping Use: Never used  Substance Use Topics  . Alcohol use: Yes    Alcohol/week: 16.0 standard drinks    Types: 14 Cans of beer, 2 Shots of liquor per week    Comment: [place] of beers per day  . Drug use: No    Home Medications Prior to Admission medications   Medication Sig Start Date End Date Taking? Authorizing Provider  aspirin 81 MG tablet Take 81 mg by mouth daily.    [provider]  atorvastatin (LIPITOR) 20 MG tablet TAKE 1 TABLET BY MOUTH EVERY DAY 02/20/17   Larey Dresser, MD  linaclotide [facility]) 145 MCG CAPS capsule Take 145 mcg by mouth in the morning and at bedtime.    [provider]  memantine (NAMENDA) 10 MG tablet TAKE 1 TABLET BY MOUTH TWICE A DAY 08/27/19   Ward Givens, NP  Multiple Vitamin (MULTIVITAMIN) tablet Take 1 tablet by mouth every morning.    [provider]  NUCYNTA 50 MG tablet Take 50 mg by mouth in the morning, at noon, and at bedtime. 02/08/20   [provider]  polyethylene glycol (MIRALAX / GLYCOLAX) 17 g packet Take 17 g by mouth daily as needed for mild constipation. Uses it every other day- depending on bowel movements.    [provider]  prednisoLONE acetate (PRED FORTE) 1 % ophthalmic suspension Place 1 drop into the left eye daily.    [provider]    Allergies    Codeine, Hydrocodone, Aricept [donepezil hcl], Morphine sulfate, Oxycodone hcl, Pregabalin, Methocarbamol, and Other  Review of Systems   Review of Systems  Unable to perform ROS:  Dementia    Physical Exam Updated Vital Signs BP (!) 113/56   Pulse 83   Temp 99.3 F (37.4 C) (Rectal)   Resp 19   Ht 5\' 10"  (1.778 m)   Wt 96.6 kg   SpO2 93%   BMI 30.56 kg/m   Physical Exam Vitals and nursing note reviewed.  Constitutional:      Appearance: Normal appearance. He is well-developed.     Comments: Somnolent. Arousable to verbal stimuli.   HENT:     Head: Normocephalic and atraumatic.  Eyes:     General: Lids are normal.  Conjunctiva/sclera: Conjunctivae normal.     Pupils: Pupils are equal, round, and reactive to light.     Comments: 2 mm pupils noted bilaterally  Cardiovascular:     Rate and Rhythm: Normal rate and regular rhythm.     Pulses: Normal pulses.     Heart sounds: Normal heart sounds. No murmur heard. No friction rub. No gallop.   Pulmonary:     Effort: Pulmonary effort is normal. Tachypnea present.     Breath sounds: Normal breath sounds.     Comments: Slight tachypnea. No wheezes  Abdominal:     Palpations: Abdomen is soft. Abdomen is not rigid.     Tenderness: There is no abdominal tenderness. There is no guarding.     Comments: Abdomen is soft, non-distended, non-tender. No rigidity, No guarding. No peritoneal signs.  Musculoskeletal:        General: Normal range of motion.     Cervical back: Full passive range of motion without pain.  Skin:    General: Skin is warm and dry.     Capillary Refill: Capillary refill takes less than 2 seconds.  Neurological:     Mental Status: He is lethargic.     Comments: Can tell me his name Aroused to verbal stimuli Follows commands, Moves all extremities  5/5 strength to BUE and BLE  Sensation intact throughout all major nerve distributions CN III-XII intact bilaterally.   Psychiatric:        Speech: Speech normal.     ED Results / Procedures / Treatments   Labs (all labs ordered are listed, but only abnormal results are displayed) Labs Reviewed  COMPREHENSIVE METABOLIC PANEL -  Abnormal; Notable for the following components:      Result Value   Glucose, Bld 103 (*)    All other components within normal limits  CBC WITH DIFFERENTIAL/PLATELET - Abnormal; Notable for the following components:   Abs Immature Granulocytes 0.10 (*)    All other components within normal limits  ACETAMINOPHEN LEVEL - Abnormal; Notable for the following components:   Acetaminophen (Tylenol), Serum <10 (*)    All other components within normal limits  SALICYLATE LEVEL - Abnormal; Notable for the following components:   Salicylate Lvl <7.3 (*)    All other components within normal limits  BLOOD GAS, VENOUS - Abnormal; Notable for the following components:   pO2, Ven <31.0 (*)    Bicarbonate 29.2 (*)    Acid-Base Excess 2.3 (*)    All other components within normal limits  CBG MONITORING, ED - Abnormal; Notable for the following components:   Glucose-Capillary 104 (*)    All other components within normal limits  ETHANOL  RAPID URINE DRUG SCREEN, HOSP PERFORMED  URINALYSIS, ROUTINE W REFLEX MICROSCOPIC    EKG EKG Interpretation  Date/Time:  Friday June 17 2020 14:33:10 EST Ventricular Rate:  80 PR Interval:    QRS Duration: 110 QT Interval:  384 QTC Calculation: 443 R Axis:   70 Text Interpretation: Sinus rhythm Prolonged PR interval No acute changes No significant change since last tracing Confirmed by Varney Biles [phone]) on 06/17/2020 2:48:00 PM   Radiology DG Chest 1 View  Result Date: 06/17/2020 CLINICAL DATA:  Altered mental status EXAM: CHEST  1 VIEW COMPARISON:  05/07/2020 FINDINGS: Stable cardiomediastinal contours. Atherosclerotic calcification of the aortic knob. No focal airspace consolidation, pleural effusion, or pneumothorax. Left shoulder arthroplasty. IMPRESSION: No active disease. Electronically Signed   By: Davina Poke D.O.   On: 06/17/2020 15:14  CT Head Wo Contrast  Result Date: 06/17/2020 CLINICAL DATA:  Altered mental status. EXAM: CT HEAD  WITHOUT CONTRAST TECHNIQUE: Contiguous axial images were obtained from the base of the skull through the vertex without intravenous contrast. COMPARISON:  September 08, 2018 FINDINGS: Brain: There is moderate severity cerebral atrophy with widening of the extra-axial spaces and ventricular dilatation. There are areas of decreased attenuation within the white matter tracts of the supratentorial brain, consistent with microvascular disease changes. Vascular: No hyperdense vessel or unexpected calcification. Skull: Normal. Negative for fracture or focal lesion. Sinuses/Orbits: Chronic, marked severity bilateral anterior nasal mucosal thickening is seen. Other: None. IMPRESSION: 1. Generalized cerebral atrophy. 2. No acute intracranial abnormality. Electronically Signed   By: Virgina Norfolk M.D.   On: 06/17/2020 15:41    Procedures Procedures   Medications Ordered in ED Medications  naloxone (NARCAN) nasal spray 4 mg/0.1 mL (1 spray Nasal Provided for home use 06/17/20 1440)  naloxone [facility]) injection 0.4 mg (0.4 mg Intravenous Given 06/17/20 1541)  haloperidol lactate (HALDOL) injection 5 mg (5 mg Intravenous Given 06/17/20 1929)  LORazepam (ATIVAN) injection 1 mg (1 mg Intravenous Given 06/17/20 2122)    ED Course  I have reviewed the triage vital signs and the nursing notes.  Pertinent labs & imaging results that were available during my care of the patient were reviewed by me and considered in my medical decision making (see chart for details).    MDM Rules/Calculators/A&P                          80 y.o. M with PMH/o Afib, HTN, HLD who presents for evaluation of lethargy. Wife left this AM for errands and when she returned, patient was difficult to arouse on the couch. He has a history of dementia and is at baseline alert and oriented x 1.  On initial arrival, he is lethargic, responsive to verbal stimuli.  Follows most commands.  He can tell me his name.  Denies any complaints at this time.  He  does have 2 mm pupils bilaterally.  He does take Nucynta.  Question if this is unintentional overdose.  Will give Narcan.  We will also check labs, VBG.  At this time, unclear last normal and exam is not concerning for CVA. Will not initiate code stroke at this time.   Re-evaluation after Narcan x1. Patient's pupils slightly larger (4 mm). He is keeping his eyes open more and following me around the room. Will give additional narcan to see if there is any improvement.   Wife does report some improvement after narcan x 2.   UDS is negative.  UA negative for infection.  Acetaminophen, salicylate, ethanol level unremarkable.  VBG shows pH of 7.3, PCO2 of 57.9, PO2 of 31.  CMP is unremarkable.  CBC is within normal limits.  CT head negative for any acute intracranial normality. CXR unremarkable.   Patient did have some improvement after Narcan x2 but then is back to being more lethargic.  Given that we do not a clear answer as to what is causing his symptoms, will obtain an MRI to rule out stroke as the cause of his symptoms. Wife is agreeable. While I was in the room, patient did say he took two [place] today. Wife thought he could not open the bottle.  We discussed that given his dementia as well as difficulty with memory, it is unclear if this is reliable information.  We will proceed with  MRI.  Patient signed out to Dr. Kathrynn Humble pending MRI.   Portions of this note were generated with [employer]. Dictation errors may occur despite best attempts at proofreading.   Final Clinical Impression(s) / ED Diagnoses Final diagnoses:  Lethargy    Rx / DC Orders ED Discharge Orders    None       Desma Mcgregor 06/17/20 2213    Varney Biles, MD 06/17/20 2310

## 2020-06-17 NOTE — ED Notes (Signed)
Patient transported to MRI 

## 2020-06-17 NOTE — ED Notes (Signed)
MRI brought patient back. Wife needs to go home to get the stimulator to turn off the device in his back. Will call MRI when wife returns in 20 minutes.

## 2020-06-17 NOTE — ED Notes (Addendum)
Wife returned with device. Called MRI. No answer. Evette Cristal, [place] notified.

## 2020-06-17 NOTE — ED Notes (Signed)
MRI called. They are coming to get the patient in 20 minutes.

## 2020-06-18 ENCOUNTER — Encounter (HOSPITAL_COMMUNITY): Payer: Self-pay | Admitting: Family Medicine

## 2020-06-18 ENCOUNTER — Other Ambulatory Visit: Payer: Self-pay

## 2020-06-18 DIAGNOSIS — R400 Somnolence: Secondary | ICD-10-CM

## 2020-06-18 DIAGNOSIS — R4182 Altered mental status, unspecified: Secondary | ICD-10-CM | POA: Diagnosis present

## 2020-06-18 DIAGNOSIS — T50905A Adverse effect of unspecified drugs, medicaments and biological substances, initial encounter: Secondary | ICD-10-CM | POA: Diagnosis not present

## 2020-06-18 LAB — COMPREHENSIVE METABOLIC PANEL
ALT: 15 U/L (ref 0–44)
AST: 13 U/L — ABNORMAL LOW (ref 15–41)
Albumin: 3.4 g/dL — ABNORMAL LOW (ref 3.5–5.0)
Alkaline Phosphatase: 67 U/L (ref 38–126)
Anion gap: 9 (ref 5–15)
BUN: 17 mg/dL (ref 8–23)
CO2: 27 mmol/L (ref 22–32)
Calcium: 8.4 mg/dL — ABNORMAL LOW (ref 8.9–10.3)
Chloride: 103 mmol/L (ref 98–111)
Creatinine, Ser: 0.9 mg/dL (ref 0.61–1.24)
GFR, Estimated: >60 mL/min (ref >60)
Glucose, Bld: 85 mg/dL (ref 70–99)
Potassium: 3.6 mmol/L (ref 3.5–5.1)
Sodium: 139 mmol/L (ref 135–145)
Total Bilirubin: 0.7 mg/dL (ref 0.3–1.2)
Total Protein: 5.8 g/dL — ABNORMAL LOW (ref 6.5–8.1)

## 2020-06-18 LAB — CBC
HCT: 38.1 % — ABNORMAL LOW (ref 39.0–52.0)
Hemoglobin: 12.8 g/dL — ABNORMAL LOW (ref 13.0–17.0)
MCH: 30.6 pg (ref 26.0–34.0)
MCHC: 33.6 g/dL (ref 30.0–36.0)
MCV: 91.1 fL (ref 80.0–100.0)
Platelets: 261 10*3/uL (ref 150–400)
RBC: 4.18 MIL/uL — ABNORMAL LOW (ref 4.22–5.81)
RDW: 12.9 % (ref 11.5–15.5)
WBC: 9.7 10*3/uL (ref 4.0–10.5)
nRBC: 0 % (ref 0.0–0.2)

## 2020-06-18 LAB — SARS CORONAVIRUS 2 (TAT 6-24 HRS): SARS Coronavirus 2: NEGATIVE

## 2020-06-18 LAB — TSH: TSH: 1.526 u[IU]/mL (ref 0.350–4.500)

## 2020-06-18 MED ORDER — ONDANSETRON HCL 4 MG/2ML IJ SOLN: 4.0000 mg | Freq: Four times a day (QID) | INTRAMUSCULAR | Status: DC | PRN

## 2020-06-18 MED ORDER — SODIUM CHLORIDE 0.9 % IV SOLN: INTRAVENOUS | Status: DC

## 2020-06-18 MED ORDER — ONDANSETRON HCL 4 MG PO TABS: 4.0000 mg | ORAL_TABLET | Freq: Four times a day (QID) | ORAL | Status: DC | PRN

## 2020-06-18 MED ORDER — MAGNESIUM CITRATE PO SOLN: 1.0000 | Freq: Once | ORAL | Status: DC | PRN

## 2020-06-18 MED ORDER — ACETAMINOPHEN 650 MG RE SUPP: 650.0000 mg | Freq: Four times a day (QID) | RECTAL | Status: DC | PRN

## 2020-06-18 MED ORDER — ACETAMINOPHEN 325 MG PO TABS: 650.0000 mg | ORAL_TABLET | Freq: Four times a day (QID) | ORAL | Status: DC | PRN

## 2020-06-18 MED ORDER — ALBUTEROL SULFATE (2.5 MG/3ML) 0.083% IN NEBU: 2.5000 mg | INHALATION_SOLUTION | Freq: Four times a day (QID) | RESPIRATORY_TRACT | Status: DC | PRN

## 2020-06-18 MED ORDER — BISACODYL 5 MG PO TBEC: 5.0000 mg | DELAYED_RELEASE_TABLET | Freq: Every day | ORAL | Status: DC | PRN

## 2020-06-18 MED ORDER — IPRATROPIUM BROMIDE 0.02 % IN SOLN: 0.5000 mg | Freq: Four times a day (QID) | RESPIRATORY_TRACT | Status: DC | PRN

## 2020-06-18 MED ORDER — SENNOSIDES-DOCUSATE SODIUM 8.6-50 MG PO TABS: 1.0000 | ORAL_TABLET | Freq: Every evening | ORAL | Status: DC | PRN

## 2020-06-18 MED ORDER — ENOXAPARIN SODIUM 40 MG/0.4ML ~~LOC~~ SOLN
40.0000 mg | SUBCUTANEOUS | Status: DC
  Administered 2020-06-18: 40 mg via SUBCUTANEOUS
  Filled 2020-06-18: qty 0.4

## 2020-06-18 NOTE — Care Management CC44 (Signed)
Condition Code 44 Documentation Completed  Patient Details  Name: Benjamin George MRN: [id number] Date of Birth: 1940-06-26   Condition Code 44 given:  Yes Patient signature on Condition Code 44 notice:  Yes Documentation of 2 MD's agreement:  Yes Code 44 added to claim:  Yes    Joaquin Courts, RN 06/18/2020, 3:09 PM

## 2020-06-18 NOTE — Progress Notes (Signed)
PROGRESS NOTE  Benjamin George  DOB: 01-Nov-1940  PCP: [name], MD JEH:[id number]  DOA: 06/17/2020  LOS: 1 day   Chief Complaint  Patient presents with  . Fatigue   Brief narrative: Benjamin George is a 80 y.o. male with PMH significant for dementia, HTN, HLD, history of cardiac arrest, A. fib not on anticoagulation, sleep apnea, gout, chronic pain syndrome, IBS. Patient was brought to the ED from home on 3/11 for altered mental status.  Patient was last known normal around 10 AM on 3/11.  Later, in next few hours, wife found him sleeping in could not wake him up.  She does not believe he would have taken extra pain pills as he is not able to open bottles by himself.  In the ED, he did have some response to Narcan but remained somnolent only to arouse with painful stimuli. Afebrile, hemodynamically stable. CBC, BMP unremarkable CT scan of head and MRI brain were obtained.  Reviewed by neurologist.  Also the radiologist had a suspicion of acute stroke, per neurologist, the findings does not correlate with an acute stroke.   Patient was admitted to hospital service for further evaluation management.  Subjective: Patient was seen and examined this morning.  Pleasant elderly Caucasian male.  Lying down in bed.  Opens eyes on verbal command.  Slow to respond.  Oriented to place and person. Chart reviewed Hemodynamically stable.  Labs remains unremarkable.  Assessment/Plan: Acute metabolic encephalopathy Underlying dementia -Patient history of dementia, brought in for increased somnolence.  Minimal response with Narcan.  No evidence of infection. -Blood gas did not show any hypercapnia.  Ammonia level normal.  No evidence of stroke per neurology. -The working diagnosis at this time is still medication induced mental status change.  He seems awake enough to eat this morning.  We will start him on dysphagia 1 diet. -Continue monitor mental status.  Seizure and aspiration  precautions.  History of atrial fibrillation -Not on anticoagulation.  History of cardiac arrest -Unclear coronary disease status.  Continue aspirin and statin.  Hyperlipidemia -On atorvastatin  History of IBS -On Linzess  History of chronic pain syndrome -On Nucynta  Mobility: Unclear baseline functional status.  Will discuss with family Code Status:   Code Status: Full Code  Nutritional status: Body mass index is 26.43 kg/m.     Diet Order            DIET - DYS 1 Room service appropriate? Yes; Fluid consistency: Thin  Diet effective now                 DVT prophylaxis: enoxaparin (LOVENOX) injection 40 mg Start: 06/18/20 1000 SCDs Start: 06/18/20 0113   Antimicrobials:  None Fluid: Normal saline at 75 mill per hour Consultants: None Family Communication:  None at bedside  Status is: Inpatient   Remains inpatient appropriate because: Altered mental status,    Dispo: The patient is from: Home              Anticipated d/c is to: Home, pending PT eval              Patient currently is not medically stable to d/c.   Difficult to place patient No       Infusions:  . sodium chloride 75 mL/hr at 06/18/20 0202    Scheduled Meds: . enoxaparin (LOVENOX) injection  40 mg Subcutaneous Q24H    Antimicrobials: Anti-infectives (From admission, onward)   None      PRN  meds: acetaminophen **OR** acetaminophen, albuterol, bisacodyl, ipratropium, magnesium citrate, ondansetron **OR** ondansetron (ZOFRAN) IV, senna-docusate   Objective: Vitals:   06/18/20 0542 06/18/20 0957  BP: (!) 133/58 (!) 148/68  Pulse: 76 79  Resp: 16 18  Temp: 98.2 F (36.8 C) 97.6 F (36.4 C)  SpO2: 96% 94%    Intake/Output Summary (Last 24 hours) at 06/18/2020 1014 Last data filed at 06/18/2020 0300 Gross per 24 hour  Intake 71.25 ml  Output --  Net 71.25 ml   Filed Weights   06/17/20 1422 06/18/20 0204  Weight: 96.6 kg 90.9 kg   Weight change:  Body mass index  is 26.43 kg/m.   Physical Exam: General exam: Pleasant, elderly Caucasian male.  With underlying dementia.  Not in physical distress Skin: No rashes, lesions or ulcers. HEENT: Atraumatic, normocephalic, no obvious bleeding Lungs: Clear to auscultation bilaterally CVS: Regular rate and rhythm, no murmur GI/Abd soft, nontender, nondistended, bowel sound present CNS: Alert, awake, oriented to place and person.  Slow to respond Psychiatry: Depressed look Extremities: No pedal edema, no calf tenderness  Data Review: I have personally reviewed the laboratory data and studies available.  Recent Labs  Lab 06/17/20 1501 06/18/20 0342  WBC 8.3 9.7  NEUTROABS 5.8  --   HGB 13.3 12.8*  HCT 40.5 38.1*  MCV 91.6 91.1  PLT 278 261   Recent Labs  Lab 06/17/20 1501 06/18/20 0342  NA 138 139  K 4.7 3.6  CL 103 103  CO2 28 27  GLUCOSE 103* 85  BUN 22 17  CREATININE 0.91 0.90  CALCIUM 9.0 8.4*    F/u labs  Unresulted Labs (From admission, onward)          Start     Ordered   06/24/20 0500  Creatinine, serum  (enoxaparin (LOVENOX)    CrCl >/= 30 ml/min)  Weekly,   R     Comments: while on enoxaparin therapy    06/18/20 0112   06/19/20 0500  CBC with Differential/Platelet  Daily,   R      06/18/20 1014   06/19/20 5681  Basic metabolic panel  Daily,   R      06/18/20 1014          Signed, Terrilee Croak, MD [facility] 06/18/2020

## 2020-06-18 NOTE — Care Management Obs Status (Signed)
[place] NOTIFICATION   Patient Details  Name: Benjamin George MRN: [id number] Date of Birth: 09-03-1940   Medicare Observation Status Notification Given:  Yes    Joaquin Courts, RN 06/18/2020, 3:09 PM

## 2020-06-18 NOTE — ED Notes (Signed)
ED TO INPATIENT HANDOFF REPORT  Name/Age/Gender Benjamin George 80 y.o. male  Code Status Code Status History    Date Active Date Inactive Code Status Order ID Comments User Context   05/07/2020 0819 05/11/2020 1928 DNR [id number]  Lequita Halt, MD ED   05/13/2018 0032 05/17/2018 1724 Full Code [id number]  Rise Patience, MD Inpatient   01/21/2017 1349 01/21/2017 1851 Full Code [id number]  Milly Jakob, MD Inpatient   05/06/2013 1651 05/07/2013 1438 Full Code [id number]  Justice Britain, PA-C Inpatient   Advance Care Planning Activity    Questions for Most Recent Historical Code Status (Order [id number])    Question Answer   In the event of cardiac or respiratory ARREST Do not call a "code blue"   In the event of cardiac or respiratory ARREST Do not perform Intubation, CPR, defibrillation or ACLS   In the event of cardiac or respiratory ARREST Use medication by any route, position, wound care, and other measures to relive pain and suffering. May use oxygen, suction and manual treatment of airway obstruction as needed for comfort.      Home/SNF/Other Home  Chief Complaint Altered mental status [R41.82]  Level of Care/Admitting Diagnosis ED Disposition    ED Disposition Condition Comment   Admit  Hospital Area: [place] [[id number]]  Level of Care: Telemetry [5]  Admit to tele based on following criteria: Other see comments  Comments: AMS  May admit patient to [facility] or [facility] if equivalent level of care is available:: No  Covid Evaluation: Asymptomatic Screening Protocol (No Symptoms)  Diagnosis: Altered mental status [780.97.ICD-9-CM]  Admitting Physician: Harvie Bridge [[id number]]  Attending Physician: Harvie Bridge [[id number]]  Estimated length of stay: past midnight tomorrow  Certification:: I certify this patient will need inpatient services for at least 2 midnights       Medical History Past Medical History:  Diagnosis Date  .  Arthritis   . Atrial fibrillation ([place])   . Cardiac arrest ([place])   . Complication of anesthesia   . Gout   . Gout   . Heart attack ([place])   . Heart disease   . HLD (hyperlipidemia)   . HTN (hypertension)   . Memory disorder 12/21/2015  . PONV (postoperative nausea and vomiting)   . Sleep apnea    NPSG 10/26/07 - AHI 16.4 used5 yrs, uses CPAP nightly    Allergies Allergies  Allergen Reactions  . Codeine Nausea And Vomiting and Other (See Comments)  . Hydrocodone Nausea And Vomiting  . Aricept [Donepezil Hcl]     Nausea  . Morphine Sulfate Other (See Comments)  . Oxycodone Hcl Other (See Comments)    Dizzy and uneasy  . Pregabalin Other (See Comments)  . Methocarbamol Nausea Only and Other (See Comments)  . Other Nausea Only and Other (See Comments)    vibramyacin    IV Location/Drains/Wounds Patient Lines/Drains/Airways Status    Active Line/Drains/Airways    Name Placement date Placement time Site Days   Peripheral IV 06/17/20 Anterior;Left Forearm 06/17/20  --  Forearm  1   Peripheral IV 06/17/20 Right Antecubital 06/17/20  1503  Antecubital  1   External Urinary Catheter 09/08/18  1600  --  649          Labs/Imaging Results for orders placed or performed during the hospital encounter of 06/17/20 (from the past 48 hour(s))  POC CBG, ED     Status: Abnormal   Collection Time: 06/17/20  2:42 PM  Result Value Ref Range   Glucose-Capillary 104 (H) 70 - 99 mg/dL    Comment: Glucose reference range applies only to samples taken after fasting for at least 8 hours.  Comprehensive metabolic panel     Status: Abnormal   Collection Time: 06/17/20  3:01 PM  Result Value Ref Range   Sodium 138 135 - 145 mmol/L   Potassium 4.7 3.5 - 5.1 mmol/L   Chloride 103 98 - 111 mmol/L   CO2 28 22 - 32 mmol/L   Glucose, Bld 103 (H) 70 - 99 mg/dL    Comment: Glucose reference range applies only to samples taken after fasting for at least 8 hours.   BUN 22 8 - 23 mg/dL   Creatinine,  Ser 0.91 0.61 - 1.24 mg/dL   Calcium 9.0 8.9 - 10.3 mg/dL   Total Protein 6.5 6.5 - 8.1 g/dL   Albumin 3.9 3.5 - 5.0 g/dL   AST 17 15 - 41 U/L   ALT 16 0 - 44 U/L   Alkaline Phosphatase 76 38 - 126 U/L   Total Bilirubin 0.6 0.3 - 1.2 mg/dL   GFR, Estimated >60 >60 mL/min    Comment: (NOTE) Calculated using the CKD-EPI Creatinine Equation (2021)    Anion gap 7 5 - 15    Comment: Performed at [facility], [place] [address], [place], [place] [address]  CBC with Differential     Status: Abnormal   Collection Time: 06/17/20  3:01 PM  Result Value Ref Range   WBC 8.3 4.0 - 10.5 K/uL   RBC 4.42 4.22 - 5.81 MIL/uL   Hemoglobin 13.3 13.0 - 17.0 g/dL   HCT 40.5 39.0 - 52.0 %   MCV 91.6 80.0 - 100.0 fL   MCH 30.1 26.0 - 34.0 pg   MCHC 32.8 30.0 - 36.0 g/dL   RDW 12.9 11.5 - 15.5 %   Platelets 278 150 - 400 K/uL   nRBC 0.0 0.0 - 0.2 %   Neutrophils Relative % 70 %   Neutro Abs 5.8 1.7 - 7.7 K/uL   Lymphocytes Relative 21 %   Lymphs Abs 1.7 0.7 - 4.0 K/uL   Monocytes Relative 8 %   Monocytes Absolute 0.7 0.1 - 1.0 K/uL   Eosinophils Relative 0 %   Eosinophils Absolute 0.0 0.0 - 0.5 K/uL   Basophils Relative 0 %   Basophils Absolute 0.0 0.0 - 0.1 K/uL   Immature Granulocytes 1 %   Abs Immature Granulocytes 0.10 (H) 0.00 - 0.07 K/uL    Comment: Performed at [facility], [place] [address], [place], [place] [address]  Ethanol     Status: None   Collection Time: 06/17/20  3:04 PM  Result Value Ref Range   Alcohol, Ethyl (B) <10 <10 mg/dL    Comment: (NOTE) Lowest detectable limit for serum alcohol is 10 mg/dL.  For medical purposes only. Performed at [facility], [place] [address] [place], [place] [address]   Acetaminophen level     Status: Abnormal   Collection Time: 06/17/20  3:04 PM  Result Value Ref Range   Acetaminophen (Tylenol), Serum <10 (L) 10 - 30 ug/mL    Comment: (NOTE) Therapeutic concentrations vary significantly.  A range of 10-30 ug/mL  may be an effective concentration for many patients. However, some  are best treated at concentrations outside of this range. Acetaminophen concentrations >150 ug/mL at 4 hours after ingestion  and >50 ug/mL at 12 hours after ingestion  are often associated with  toxic reactions.  Performed at [facility], [place] [address] [place], [place] [address]   Salicylate level     Status: Abnormal   Collection Time: 06/17/20  3:04 PM  Result Value Ref Range   Salicylate Lvl <9.3 (L) 7.0 - 30.0 mg/dL    Comment: Performed at [facility], [place] [address], [place], [place] [address]  Blood gas, venous (at [facility] and [facility], not at [facility])     Status: Abnormal   Collection Time: 06/17/20  3:04 PM  Result Value Ref Range   pH, Ven 7.324 7.250 - 7.430   pCO2, Ven 57.9 44.0 - 60.0 mmHg   pO2, Ven <31.0 (LL) 32.0 - 45.0 mmHg    Comment: CRITICAL RESULT CALLED TO, READ BACK BY AND VERIFIED WITH: C.FRANKLIN AT 1553 ON 03.11.22 BY N.THOMPSON    Bicarbonate 29.2 (H) 20.0 - 28.0 mmol/L   Acid-Base Excess 2.3 (H) 0.0 - 2.0 mmol/L   O2 Saturation 29.1 %   Patient temperature 98.6     Comment: Performed at [facility], [place] [address] [place], [place] [address]  Urine rapid drug screen (hosp performed)     Status: None   Collection Time: 06/17/20  3:40 PM  Result Value Ref Range   Opiates NONE DETECTED NONE DETECTED   Cocaine NONE DETECTED NONE DETECTED   Benzodiazepines NONE DETECTED NONE DETECTED   Amphetamines NONE DETECTED NONE DETECTED   Tetrahydrocannabinol NONE DETECTED NONE DETECTED   Barbiturates NONE DETECTED NONE DETECTED    Comment: (NOTE) DRUG SCREEN FOR MEDICAL PURPOSES ONLY.  IF CONFIRMATION IS NEEDED FOR ANY PURPOSE, NOTIFY LAB WITHIN 5 DAYS.  LOWEST DETECTABLE LIMITS FOR URINE DRUG SCREEN Drug Class                     Cutoff (ng/mL) Amphetamine and metabolites    1000 Barbiturate and metabolites     200 Benzodiazepine                 329 Tricyclics and metabolites     300 Opiates and metabolites        300 Cocaine and metabolites        300 THC                            50 Performed at [facility], [place] [address], [place], [place] [address]   Urinalysis, Routine w reflex microscopic Urine, Catheterized     Status: None   Collection Time: 06/17/20  3:40 PM  Result Value Ref Range   Color, Urine YELLOW YELLOW   APPearance CLEAR CLEAR   Specific Gravity, Urine 1.016 1.005 - 1.030   pH 6.0 5.0 - 8.0   Glucose, UA NEGATIVE NEGATIVE mg/dL   Hgb urine dipstick NEGATIVE NEGATIVE   Bilirubin Urine NEGATIVE NEGATIVE   Ketones, ur NEGATIVE NEGATIVE mg/dL   Protein, ur NEGATIVE NEGATIVE mg/dL   Nitrite NEGATIVE NEGATIVE   Leukocytes,Ua NEGATIVE NEGATIVE    Comment: Performed at [facility], [place] [address] [place], [place] [address]  Ammonia     Status: None   Collection Time: 06/17/20 11:09 PM  Result Value Ref Range   Ammonia 26 9 - 35 umol/L    Comment: Performed at [facility], [place] [address], [place],  [address]   DG Chest 1 View  Result Date: 06/17/2020 CLINICAL DATA:  Altered mental status EXAM: CHEST  1 VIEW COMPARISON:  05/07/2020 FINDINGS: Stable cardiomediastinal contours. Atherosclerotic calcification of the aortic knob. No focal airspace consolidation, pleural effusion, or pneumothorax. Left shoulder arthroplasty. IMPRESSION: No active disease. Electronically Signed   By: Davina Poke D.O.   On: 06/17/2020 15:14   CT Head Wo Contrast  Result Date: 06/17/2020 CLINICAL DATA:  Altered mental status. EXAM: CT HEAD WITHOUT CONTRAST TECHNIQUE: Contiguous axial images were obtained from the base of the skull through the vertex without intravenous contrast. COMPARISON:  September 08, 2018 FINDINGS: Brain: There is moderate severity cerebral atrophy with widening of the extra-axial spaces and ventricular dilatation.  There are areas of decreased attenuation within the white matter tracts of the supratentorial brain, consistent with microvascular disease changes. Vascular: No hyperdense vessel or unexpected calcification. Skull: Normal. Negative for fracture or focal lesion. Sinuses/Orbits: Chronic, marked severity bilateral anterior nasal mucosal thickening is seen. Other: None. IMPRESSION: 1. Generalized cerebral atrophy. 2. No acute intracranial abnormality. Electronically Signed   By: Virgina Norfolk M.D.   On: 06/17/2020 15:41   MR BRAIN WO CONTRAST  Result Date: 06/17/2020 CLINICAL DATA:  Encephalopathy EXAM: MRI HEAD WITHOUT CONTRAST TECHNIQUE: Multiplanar, multiecho pulse sequences of the brain and surrounding structures were obtained without intravenous contrast. COMPARISON:  None. FINDINGS: Brain: The examination is severely degraded by motion. There is a punctate focus of abnormal diffusion restriction in the left corona radiata (series 5, image 91). No other diffusion abnormality. No acute hemorrhage. Confluent hyperintense T2-weighted white matter signal. Generalized volume loss without a clear lobar predilection. The midline structures are normal. Vascular: Major flow voids are preserved. Skull and upper cervical spine: Normal calvarium and skull base. Visualized upper cervical spine and soft tissues are normal. Sinuses/Orbits:No paranasal sinus fluid levels or advanced mucosal thickening. No mastoid or middle ear effusion. Normal orbits. IMPRESSION: 1. Severely motion degraded examination. 2. Punctate focus of abnormal diffusion restriction in the left corona radiata, consistent with a small acute to subacute infarct. No hemorrhage or mass effect. 3. Confluent hyperintense T2-weighted white matter signal, consistent with chronic small vessel ischemic disease. Electronically Signed   By: Ulyses Jarred M.D.   On: 06/17/2020 22:25    Pending Labs Unresulted Labs (From admission, onward)          Start      Ordered   06/17/20 2307  SARS CORONAVIRUS 2 (TAT 6-24 HRS) Nasopharyngeal Nasopharyngeal Swab  (Tier 3 - Symptomatic/asymptomatic with Precautions)  Once,   STAT       Question Answer Comment  Is this test for diagnosis or screening Screening   Symptomatic for COVID-19 as defined by CDC No   Hospitalized for COVID-19 No   Admitted to ICU for COVID-19 No   Previously tested for COVID-19 No   Resident in a congregate (group) care setting No   Employed in healthcare setting No   Has patient completed COVID vaccination(s) (2 doses of Pfizer/Moderna 1 dose of [employer]) Yes   Has patient completed COVID Booster / 3rd dose Unknown      06/17/20 2307   Signed and Held  CBC  (enoxaparin (LOVENOX)    CrCl >/= 30 ml/min)  Once,   R       Comments: Baseline for enoxaparin therapy IF NOT ALREADY DRAWN.  Notify MD if PLT < 100 K.    Signed and Held   Signed and Held  Creatinine, serum  (enoxaparin (LOVENOX)    CrCl >/= 30 ml/min)  Once,   R       Comments: Baseline for enoxaparin therapy IF NOT ALREADY DRAWN.    Signed and Held   Signed and Held  Creatinine, serum  (enoxaparin (LOVENOX)    CrCl >/= 30 ml/min)  Weekly,   R     Comments: while on enoxaparin therapy    Signed and Held   Signed and Held  Comprehensive metabolic panel  Tomorrow morning,   R        Signed and Held   Signed and Held  CBC  Tomorrow morning,   R        Signed and Held   Signed and Held  TSH  Once,   R        Signed and Held          Vitals/Pain Today's Vitals   06/17/20 2030 06/17/20 2130 06/17/20 2345 06/18/20 0100  BP: (!) 118/102 (!) 113/56 (!) 144/72 139/69  Pulse: 91 83 78 69  Resp: 14 19 18 15   Temp:    97.8 F (36.6 C)  TempSrc:    Axillary  SpO2: 95% 93% 96% 97%  Weight:      Height:        Isolation Precautions Airborne and Contact precautions  Medications Medications  naloxone (NARCAN) nasal spray 4 mg/0.1 mL (1 spray Nasal Provided for home use 06/17/20 1440)  naloxone (NARCAN)  injection 0.4 mg (0.4 mg Intravenous Given 06/17/20 1541)  haloperidol lactate (HALDOL) injection 5 mg (5 mg Intravenous Given 06/17/20 1929)  LORazepam (ATIVAN) injection 1 mg (1 mg Intravenous Given 06/17/20 2122)  naloxone [facility]) injection 0.4 mg (0.4 mg Intravenous Given 06/17/20 2339)    Mobility walks with person assist

## 2020-06-18 NOTE — Discharge Summary (Signed)
Physician Discharge Summary  Benjamin George WUJ:[id number] DOB: Nov 25, 1940 DOA: 06/17/2020  PCP: [name], MD  Admit date: 06/17/2020 Discharge date: 06/18/2020  Admitted From: Home Discharge disposition: Home   Code Status: Full Code  Diet Recommendation: Regular diet  Discharge Diagnosis:   Active Problems:   Altered mental status  Chief Complaint  Patient presents with  . Fatigue   Brief narrative: Benjamin George is a 80 y.o. male with PMH significant for dementia, HTN, HLD, history of cardiac arrest, A. fib not on anticoagulation, sleep apnea, gout, chronic pain syndrome, IBS. Patient was brought to the ED from home on 3/11 for altered mental status.  Patient was last known normal around 10 AM on 3/11.  Later, in next few hours, wife found him sleeping in could not wake him up.  She does not believe he would have taken extra pain pills as he is not able to open bottles by himself.  In the ED, he did have some response to Narcan but remained somnolent only to arouse with painful stimuli. Afebrile, hemodynamically stable. CBC, BMP unremarkable CT scan of head and MRI brain were obtained.  Reviewed by neurologist.  Also the radiologist had a suspicion of acute stroke, per neurologist, the findings does not correlate with an acute stroke.   Patient was admitted to hospital service for further evaluation management.  Subjective: Patient was seen and examined this morning.  Pleasant elderly Caucasian male.  Lying down in bed.  Opens eyes on verbal command.  Slow to respond.  Oriented to place and person. Hemodynamically stable. He was seen later this afternoon again.  Wide-awake.  Oriented to place and person.  Wife at bedside.  She believes he is back to his normal and wants to take him home.  Assessment/Plan: Acute metabolic encephalopathy Underlying dementia -Patient history of dementia, brought in for increased somnolence.  Minimal response with Narcan.  No evidence  of infection. -Blood gas did not show any hypercapnia.  Ammonia level normal.  No evidence of stroke per neurology. -Neck several hours, his mental status improved back to baseline.  To discharge home today.  History of atrial fibrillation -Not on anticoagulation.  History of cardiac arrest -Unclear coronary disease status.  Continue aspirin and statin.  Hyperlipidemia -On atorvastatin  History of IBS -On Linzess  History of chronic pain syndrome -On Nucynta    Wound care: Wound / Incision (Open or Dehisced) 06/18/20 (MASD) Moisture Associated Skin Damage Buttocks Medial Reddened and blanchable (Active)  Date First Assessed/Time First Assessed: 06/18/20 0200   Wound Type: (MASD) Moisture Associated Skin Damage  Location: Buttocks  Location Orientation: Medial  Wound Description (Comments): Reddened and blanchable  Present on Admission: Yes    Assessments 06/18/2020  7:17 AM  Wound Length (cm) 0 cm  Wound Width (cm) 0 cm  Wound Depth (cm) 0 cm  Wound Volume (cm^3) 0 cm^3  Wound Surface Area (cm^2) 0 cm^2     No Linked orders to display    Discharge Exam:   Vitals:   06/18/20 0136 06/18/20 0204 06/18/20 0542 06/18/20 0957  BP: (!) 147/74  (!) 133/58 (!) 148/68  Pulse: 72  76 79  Resp: 14  16 18   Temp: 97.6 F (36.4 C)  98.2 F (36.8 C) 97.6 F (36.4 C)  TempSrc: Oral  Oral Oral  SpO2: 95%  96% 94%  Weight:  90.9 kg    Height:  6\' 1"  (1.854 m)      General exam:  Pleasant, elderly Caucasian male.  With underlying dementia.  Not in physical distress Skin: No rashes, lesions or ulcers. HEENT: Atraumatic, normocephalic, no obvious bleeding Lungs: Clear to auscultation bilaterally CVS: Regular rate and rhythm, no murmur GI/Abd soft, nontender, nondistended, bowel sound present CNS: Alert, awake, oriented to place and person.  Slow to respond Psychiatry: Depressed look Extremities: No pedal edema, no calf tenderness  Follow ups:   Discharge Instructions     Diet general   Complete by: As directed    Increase activity slowly   Complete by: As directed       Follow-up Information    [name], MD In 1 week.   Specialty: Internal Medicine Contact information: [address] [place] [address] [phone]        Jerline Pain, MD Follow up.   Specialty: Cardiology Contact information: [address] [place] [address] [phone]               Recommendations for Outpatient Follow-Up:   1. Follow-up with PCP as an outpatient  Discharge Instructions:  Follow with Primary MD [name], MD in 7 days   Get CBC/BMP checked in next visit within 1 week by PCP or SNF MD ( we routinely change or add medications that can affect your baseline labs and fluid status, therefore we recommend that you get the mentioned basic workup next visit with your PCP, your PCP may decide not to get them or add new tests based on their clinical decision)  On your next visit with your PCP, please Get Medicines reviewed and adjusted.  Please request your PCP  to go over all Hospital Tests and Procedure/Radiological results at the follow up, please get all Hospital records sent to your Prim MD by signing hospital release before you go home.  Activity: As tolerated with Full fall precautions use walker/cane & assistance as needed  For Heart failure patients - Check your Weight same time everyday, if you gain over 2 pounds, or you develop in leg swelling, experience more shortness of breath or chest pain, call your Primary MD immediately. Follow Cardiac Low Salt Diet and 1.5 lit/day fluid restriction.  If you have smoked or chewed Tobacco in the last 2 yrs please stop smoking, stop any regular Alcohol  and or any Recreational drug use.  If you experience worsening of your admission symptoms, develop shortness of breath, life threatening emergency, suicidal or homicidal thoughts you must seek medical attention  immediately by calling 911 or calling your MD immediately  if symptoms less severe.  You Must read complete instructions/literature along with all the possible adverse reactions/side effects for all the Medicines you take and that have been prescribed to you. Take any new Medicines after you have completely understood and accpet all the possible adverse reactions/side effects.   Do not drive, operate heavy machinery, perform activities at heights, swimming or participation in water activities or provide baby sitting services if your were admitted for syncope or siezures until you have seen by Primary MD or a Neurologist and advised to do so again.  Do not drive when taking Pain medications.  Do not take more than prescribed Pain, Sleep and Anxiety Medications  Wear Seat belts while driving.   Please note You were cared for by a hospitalist during your hospital stay. If you have any questions about your discharge medications or the care you received while you were in the hospital after you are discharged, you  can call the unit and asked to speak with the hospitalist on call if the hospitalist that took care of you is not available. Once you are discharged, your primary care physician will handle any further medical issues. Please note that NO REFILLS for any discharge medications will be authorized once you are discharged, as it is imperative that you return to your primary care physician (or establish a relationship with a primary care physician if you do not have one) for your aftercare needs so that they can reassess your need for medications and monitor your lab values.    Allergies as of 06/18/2020      Reactions   Codeine Nausea And Vomiting, Other (See Comments)   Hydrocodone Nausea And Vomiting   Aricept [donepezil Hcl]    Nausea   Morphine Sulfate Other (See Comments)   Oxycodone Hcl Other (See Comments)   Dizzy and uneasy   Pregabalin Other (See Comments)   Methocarbamol Nausea  Only, Other (See Comments)   Other Nausea Only, Other (See Comments)   vibramyacin      Medication List    TAKE these medications   aspirin 81 MG tablet Take 81 mg by mouth daily.   atorvastatin 20 MG tablet Commonly known as: LIPITOR TAKE 1 TABLET BY MOUTH EVERY DAY   linaclotide 145 MCG Caps capsule Commonly known as: LINZESS Take 145 mcg by mouth in the morning and at bedtime.   memantine 10 MG tablet Commonly known as: NAMENDA TAKE 1 TABLET BY MOUTH TWICE A DAY   multivitamin tablet Take 1 tablet by mouth every morning.   Nucynta 50 MG tablet Generic drug: tapentadol Take 50 mg by mouth in the morning, at noon, and at bedtime.   polyethylene glycol 17 g packet Commonly known as: MIRALAX / GLYCOLAX Take 17 g by mouth daily as needed for mild constipation. Uses it every other day- depending on bowel movements.       Time coordinating discharge: 35 minutes  The results of significant diagnostics from this hospitalization (including imaging, microbiology, ancillary and laboratory) are listed below for reference.    Procedures and Diagnostic Studies:   DG Chest 1 View  Result Date: 06/17/2020 CLINICAL DATA:  Altered mental status EXAM: CHEST  1 VIEW COMPARISON:  05/07/2020 FINDINGS: Stable cardiomediastinal contours. Atherosclerotic calcification of the aortic knob. No focal airspace consolidation, pleural effusion, or pneumothorax. Left shoulder arthroplasty. IMPRESSION: No active disease. Electronically Signed   By: Davina Poke D.O.   On: 06/17/2020 15:14   CT Head Wo Contrast  Result Date: 06/17/2020 CLINICAL DATA:  Altered mental status. EXAM: CT HEAD WITHOUT CONTRAST TECHNIQUE: Contiguous axial images were obtained from the base of the skull through the vertex without intravenous contrast. COMPARISON:  September 08, 2018 FINDINGS: Brain: There is moderate severity cerebral atrophy with widening of the extra-axial spaces and ventricular dilatation. There are areas  of decreased attenuation within the white matter tracts of the supratentorial brain, consistent with microvascular disease changes. Vascular: No hyperdense vessel or unexpected calcification. Skull: Normal. Negative for fracture or focal lesion. Sinuses/Orbits: Chronic, marked severity bilateral anterior nasal mucosal thickening is seen. Other: None. IMPRESSION: 1. Generalized cerebral atrophy. 2. No acute intracranial abnormality. Electronically Signed   By: Virgina Norfolk M.D.   On: 06/17/2020 15:41   MR BRAIN WO CONTRAST  Result Date: 06/17/2020 CLINICAL DATA:  Encephalopathy EXAM: MRI HEAD WITHOUT CONTRAST TECHNIQUE: Multiplanar, multiecho pulse sequences of the brain and surrounding structures were obtained without intravenous contrast. COMPARISON:  None. FINDINGS: Brain: The examination is severely degraded by motion. There is a punctate focus of abnormal diffusion restriction in the left corona radiata (series 5, image 91). No other diffusion abnormality. No acute hemorrhage. Confluent hyperintense T2-weighted white matter signal. Generalized volume loss without a clear lobar predilection. The midline structures are normal. Vascular: Major flow voids are preserved. Skull and upper cervical spine: Normal calvarium and skull base. Visualized upper cervical spine and soft tissues are normal. Sinuses/Orbits:No paranasal sinus fluid levels or advanced mucosal thickening. No mastoid or middle ear effusion. Normal orbits. IMPRESSION: 1. Severely motion degraded examination. 2. Punctate focus of abnormal diffusion restriction in the left corona radiata, consistent with a small acute to subacute infarct. No hemorrhage or mass effect. 3. Confluent hyperintense T2-weighted white matter signal, consistent with chronic small vessel ischemic disease. Electronically Signed   By: Ulyses Jarred M.D.   On: 06/17/2020 22:25     Labs:   Basic Metabolic Panel: Recent Labs  Lab 06/17/20 1501 06/18/20 0342  NA 138  139  K 4.7 3.6  CL 103 103  CO2 28 27  GLUCOSE 103* 85  BUN 22 17  CREATININE 0.91 0.90  CALCIUM 9.0 8.4*   GFR Estimated Creatinine Clearance: 74 mL/min (by C-G formula based on SCr of 0.9 mg/dL). Liver Function Tests: Recent Labs  Lab 06/17/20 1501 06/18/20 0342  AST 17 13*  ALT 16 15  ALKPHOS 76 67  BILITOT 0.6 0.7  PROT 6.5 5.8*  ALBUMIN 3.9 3.4*   No results for input(s): LIPASE, AMYLASE in the last 168 hours. Recent Labs  Lab 06/17/20 2309  AMMONIA 26   Coagulation profile No results for input(s): INR, PROTIME in the last 168 hours.  CBC: Recent Labs  Lab 06/17/20 1501 06/18/20 0342  WBC 8.3 9.7  NEUTROABS 5.8  --   HGB 13.3 12.8*  HCT 40.5 38.1*  MCV 91.6 91.1  PLT 278 261   Cardiac Enzymes: No results for input(s): CKTOTAL, CKMB, CKMBINDEX, TROPONINI in the last 168 hours. BNP: Invalid input(s): POCBNP CBG: Recent Labs  Lab 06/17/20 1442  GLUCAP 104*   D-Dimer No results for input(s): DDIMER in the last 72 hours. Hgb A1c No results for input(s): HGBA1C in the last 72 hours. Lipid Profile No results for input(s): CHOL, HDL, LDLCALC, TRIG, CHOLHDL, LDLDIRECT in the last 72 hours. Thyroid function studies Recent Labs    06/18/20 0342  TSH 1.526   Anemia work up No results for input(s): VITAMINB12, FOLATE, FERRITIN, TIBC, IRON, RETICCTPCT in the last 72 hours. Microbiology Recent Results (from the past 240 hour(s))  SARS CORONAVIRUS 2 (TAT 6-24 HRS) Nasopharyngeal Nasopharyngeal Swab     Status: None   Collection Time: 06/17/20 11:07 PM   Specimen: Nasopharyngeal Swab  Result Value Ref Range Status   SARS Coronavirus 2 NEGATIVE NEGATIVE Final    Comment: (NOTE) SARS-CoV-2 target nucleic acids are NOT DETECTED.  The SARS-CoV-2 RNA is generally detectable in upper and lower respiratory specimens during the acute phase of infection. Negative results do not preclude SARS-CoV-2 infection, do not rule out co-infections with other  pathogens, and should not be used as the sole basis for treatment or other patient management decisions. Negative results must be combined with clinical observations, patient history, and epidemiological information. The expected result is Negative.  Fact Sheet for Patients: SugarRoll.be  Fact Sheet for Healthcare Providers: https://www.woods-mathews.com/  This test is not yet approved or cleared by the [place] and  has been authorized  for detection and/or diagnosis of SARS-CoV-2 by FDA under an Emergency Use Authorization (EUA). This EUA will remain  in effect (meaning this test can be used) for the duration of the COVID-19 declaration under Se ction 564(b)(1) of the Act, 21 U.S.C. section 360bbb-3(b)(1), unless the authorization is terminated or revoked sooner.  Performed at [facility], [place] [address], [place], [place] [address]      Signed: Terrilee Croak  [facility] 06/18/2020, 2:39 PM

## 2020-06-18 NOTE — Progress Notes (Signed)
Physician Discharge Summary  Caydyn Sprung Sprong WUJ:[id number] DOB: 1941/02/20 DOA: 06/17/2020  PCP: [name], MD  Admit date: 06/17/2020 Discharge date: 06/18/2020  Admitted From: Home Discharge disposition: Home   Code Status: Full Code  Diet Recommendation: Regular diet  Discharge Diagnosis:   Active Problems:   Altered mental status  Chief Complaint  Patient presents with  . Fatigue   Brief narrative: Benjamin George is a 80 y.o. male with PMH significant for dementia, HTN, HLD, history of cardiac arrest, A. fib not on anticoagulation, sleep apnea, gout, chronic pain syndrome, IBS. Patient was brought to the ED from home on 3/11 for altered mental status.  Patient was last known normal around 10 AM on 3/11.  Later, in next few hours, wife found him sleeping in could not wake him up.  She does not believe he would have taken extra pain pills as he is not able to open bottles by himself.  In the ED, he did have some response to Narcan but remained somnolent only to arouse with painful stimuli. Afebrile, hemodynamically stable. CBC, BMP unremarkable CT scan of head and MRI brain were obtained.  Reviewed by neurologist.  Also the radiologist had a suspicion of acute stroke, per neurologist, the findings does not correlate with an acute stroke.   Patient was admitted to hospital service for further evaluation management.  Subjective: Patient was seen and examined this morning.  Pleasant elderly Caucasian male.  Lying down in bed.  Opens eyes on verbal command.  Slow to respond.  Oriented to place and person. Hemodynamically stable. He was seen later this afternoon again.  Wide-awake.  Oriented to place and person.  Wife at bedside.  She believes he is back to his normal and wants to take him home.  Assessment/Plan: Acute metabolic encephalopathy Underlying dementia -Patient history of dementia, brought in for increased somnolence.  Minimal response with Narcan.  No evidence  of infection. -Blood gas did not show any hypercapnia.  Ammonia level normal.  No evidence of stroke per neurology. -Neck several hours, his mental status improved back to baseline.  To discharge home today.  History of atrial fibrillation -Not on anticoagulation.  History of cardiac arrest -Unclear coronary disease status.  Continue aspirin and statin.  Hyperlipidemia -On atorvastatin  History of IBS -On Linzess  History of chronic pain syndrome -On Nucynta    Wound care: Wound / Incision (Open or Dehisced) 06/18/20 (MASD) Moisture Associated Skin Damage Buttocks Medial Reddened and blanchable (Active)  Date First Assessed/Time First Assessed: 06/18/20 0200   Wound Type: (MASD) Moisture Associated Skin Damage  Location: Buttocks  Location Orientation: Medial  Wound Description (Comments): Reddened and blanchable  Present on Admission: Yes    Assessments 06/18/2020  7:17 AM  Wound Length (cm) 0 cm  Wound Width (cm) 0 cm  Wound Depth (cm) 0 cm  Wound Volume (cm^3) 0 cm^3  Wound Surface Area (cm^2) 0 cm^2     No Linked orders to display    Discharge Exam:   Vitals:   06/18/20 0136 06/18/20 0204 06/18/20 0542 06/18/20 0957  BP: (!) 147/74  (!) 133/58 (!) 148/68  Pulse: 72  76 79  Resp: 14  16 18   Temp: 97.6 F (36.4 C)  98.2 F (36.8 C) 97.6 F (36.4 C)  TempSrc: Oral  Oral Oral  SpO2: 95%  96% 94%  Weight:  90.9 kg    Height:  6\' 1"  (1.854 m)      General exam:  Pleasant, elderly Caucasian male.  With underlying dementia.  Not in physical distress Skin: No rashes, lesions or ulcers. HEENT: Atraumatic, normocephalic, no obvious bleeding Lungs: Clear to auscultation bilaterally CVS: Regular rate and rhythm, no murmur GI/Abd soft, nontender, nondistended, bowel sound present CNS: Alert, awake, oriented to place and person.  Slow to respond Psychiatry: Depressed look Extremities: No pedal edema, no calf tenderness  Follow ups:   Discharge Instructions     Diet general   Complete by: As directed    Increase activity slowly   Complete by: As directed       Follow-up Information    [name], MD In 1 week.   Specialty: Internal Medicine Contact information: [address] [place] [place] [address] [phone]        Jerline Pain, MD Follow up.   Specialty: Cardiology Contact information: [address] [address] [phone]               Recommendations for Outpatient Follow-Up:   1. Follow-up with PCP as an outpatient  Discharge Instructions:  Follow with Primary MD [name], MD in 7 days   Get CBC/BMP checked in next visit within 1 week by PCP or SNF MD ( we routinely change or add medications that can affect your baseline labs and fluid status, therefore we recommend that you get the mentioned basic workup next visit with your PCP, your PCP may decide not to get them or add new tests based on their clinical decision)  On your next visit with your PCP, please Get Medicines reviewed and adjusted.  Please request your PCP  to go over all Hospital Tests and Procedure/Radiological results at the follow up, please get all Hospital records sent to your Prim MD by signing hospital release before you go home.  Activity: As tolerated with Full fall precautions use walker/cane & assistance as needed  For Heart failure patients - Check your Weight same time everyday, if you gain over 2 pounds, or you develop in leg swelling, experience more shortness of breath or chest pain, call your Primary MD immediately. Follow Cardiac Low Salt Diet and 1.5 lit/day fluid restriction.  If you have smoked or chewed Tobacco in the last 2 yrs please stop smoking, stop any regular Alcohol  and or any Recreational drug use.  If you experience worsening of your admission symptoms, develop shortness of breath, life threatening emergency, suicidal or homicidal thoughts you must seek medical attention  immediately by calling 911 or calling your MD immediately  if symptoms less severe.  You Must read complete instructions/literature along with all the possible adverse reactions/side effects for all the Medicines you take and that have been prescribed to you. Take any new Medicines after you have completely understood and accpet all the possible adverse reactions/side effects.   Do not drive, operate heavy machinery, perform activities at heights, swimming or participation in water activities or provide baby sitting services if your were admitted for syncope or siezures until you have seen by Primary MD or a Neurologist and advised to do so again.  Do not drive when taking Pain medications.  Do not take more than prescribed Pain, Sleep and Anxiety Medications  Wear Seat belts while driving.   Please note You were cared for by a hospitalist during your hospital stay. If you have any questions about your discharge medications or the care you received while you were in the hospital after you are discharged, you  can call the unit and asked to speak with the hospitalist on call if the hospitalist that took care of you is not available. Once you are discharged, your primary care physician will handle any further medical issues. Please note that NO REFILLS for any discharge medications will be authorized once you are discharged, as it is imperative that you return to your primary care physician (or establish a relationship with a primary care physician if you do not have one) for your aftercare needs so that they can reassess your need for medications and monitor your lab values.    Allergies as of 06/18/2020      Reactions   Codeine Nausea And Vomiting, Other (See Comments)   Hydrocodone Nausea And Vomiting   Aricept [donepezil Hcl]    Nausea   Morphine Sulfate Other (See Comments)   Oxycodone Hcl Other (See Comments)   Dizzy and uneasy   Pregabalin Other (See Comments)   Methocarbamol Nausea  Only, Other (See Comments)   Other Nausea Only, Other (See Comments)   vibramyacin      Medication List    TAKE these medications   aspirin 81 MG tablet Take 81 mg by mouth daily.   atorvastatin 20 MG tablet Commonly known as: LIPITOR TAKE 1 TABLET BY MOUTH EVERY DAY   linaclotide 145 MCG Caps capsule Commonly known as: LINZESS Take 145 mcg by mouth in the morning and at bedtime.   memantine 10 MG tablet Commonly known as: NAMENDA TAKE 1 TABLET BY MOUTH TWICE A DAY   multivitamin tablet Take 1 tablet by mouth every morning.   Nucynta 50 MG tablet Generic drug: tapentadol Take 50 mg by mouth in the morning, at noon, and at bedtime.   polyethylene glycol 17 g packet Commonly known as: MIRALAX / GLYCOLAX Take 17 g by mouth daily as needed for mild constipation. Uses it every other day- depending on bowel movements.       Time coordinating discharge: 35 minutes  The results of significant diagnostics from this hospitalization (including imaging, microbiology, ancillary and laboratory) are listed below for reference.    Procedures and Diagnostic Studies:   DG Chest 1 View  Result Date: 06/17/2020 CLINICAL DATA:  Altered mental status EXAM: CHEST  1 VIEW COMPARISON:  05/07/2020 FINDINGS: Stable cardiomediastinal contours. Atherosclerotic calcification of the aortic knob. No focal airspace consolidation, pleural effusion, or pneumothorax. Left shoulder arthroplasty. IMPRESSION: No active disease. Electronically Signed   By: Davina Poke D.O.   On: 06/17/2020 15:14   CT Head Wo Contrast  Result Date: 06/17/2020 CLINICAL DATA:  Altered mental status. EXAM: CT HEAD WITHOUT CONTRAST TECHNIQUE: Contiguous axial images were obtained from the base of the skull through the vertex without intravenous contrast. COMPARISON:  September 08, 2018 FINDINGS: Brain: There is moderate severity cerebral atrophy with widening of the extra-axial spaces and ventricular dilatation. There are areas  of decreased attenuation within the white matter tracts of the supratentorial brain, consistent with microvascular disease changes. Vascular: No hyperdense vessel or unexpected calcification. Skull: Normal. Negative for fracture or focal lesion. Sinuses/Orbits: Chronic, marked severity bilateral anterior nasal mucosal thickening is seen. Other: None. IMPRESSION: 1. Generalized cerebral atrophy. 2. No acute intracranial abnormality. Electronically Signed   By: Virgina Norfolk M.D.   On: 06/17/2020 15:41   MR BRAIN WO CONTRAST  Result Date: 06/17/2020 CLINICAL DATA:  Encephalopathy EXAM: MRI HEAD WITHOUT CONTRAST TECHNIQUE: Multiplanar, multiecho pulse sequences of the brain and surrounding structures were obtained without intravenous contrast. COMPARISON:  None. FINDINGS: Brain: The examination is severely degraded by motion. There is a punctate focus of abnormal diffusion restriction in the left corona radiata (series 5, image 91). No other diffusion abnormality. No acute hemorrhage. Confluent hyperintense T2-weighted white matter signal. Generalized volume loss without a clear lobar predilection. The midline structures are normal. Vascular: Major flow voids are preserved. Skull and upper cervical spine: Normal calvarium and skull base. Visualized upper cervical spine and soft tissues are normal. Sinuses/Orbits:No paranasal sinus fluid levels or advanced mucosal thickening. No mastoid or middle ear effusion. Normal orbits. IMPRESSION: 1. Severely motion degraded examination. 2. Punctate focus of abnormal diffusion restriction in the left corona radiata, consistent with a small acute to subacute infarct. No hemorrhage or mass effect. 3. Confluent hyperintense T2-weighted white matter signal, consistent with chronic small vessel ischemic disease. Electronically Signed   By: Ulyses Jarred M.D.   On: 06/17/2020 22:25     Labs:   Basic Metabolic Panel: Recent Labs  Lab 06/17/20 1501 06/18/20 0342  NA 138  139  K 4.7 3.6  CL 103 103  CO2 28 27  GLUCOSE 103* 85  BUN 22 17  CREATININE 0.91 0.90  CALCIUM 9.0 8.4*   GFR Estimated Creatinine Clearance: 74 mL/min (by C-G formula based on SCr of 0.9 mg/dL). Liver Function Tests: Recent Labs  Lab 06/17/20 1501 06/18/20 0342  AST 17 13*  ALT 16 15  ALKPHOS 76 67  BILITOT 0.6 0.7  PROT 6.5 5.8*  ALBUMIN 3.9 3.4*   No results for input(s): LIPASE, AMYLASE in the last 168 hours. Recent Labs  Lab 06/17/20 2309  AMMONIA 26   Coagulation profile No results for input(s): INR, PROTIME in the last 168 hours.  CBC: Recent Labs  Lab 06/17/20 1501 06/18/20 0342  WBC 8.3 9.7  NEUTROABS 5.8  --   HGB 13.3 12.8*  HCT 40.5 38.1*  MCV 91.6 91.1  PLT 278 261   Cardiac Enzymes: No results for input(s): CKTOTAL, CKMB, CKMBINDEX, TROPONINI in the last 168 hours. BNP: Invalid input(s): POCBNP CBG: Recent Labs  Lab 06/17/20 1442  GLUCAP 104*   D-Dimer No results for input(s): DDIMER in the last 72 hours. Hgb A1c No results for input(s): HGBA1C in the last 72 hours. Lipid Profile No results for input(s): CHOL, HDL, LDLCALC, TRIG, CHOLHDL, LDLDIRECT in the last 72 hours. Thyroid function studies Recent Labs    06/18/20 0342  TSH 1.526   Anemia work up No results for input(s): VITAMINB12, FOLATE, FERRITIN, TIBC, IRON, RETICCTPCT in the last 72 hours. Microbiology Recent Results (from the past 240 hour(s))  SARS CORONAVIRUS 2 (TAT 6-24 HRS) Nasopharyngeal Nasopharyngeal Swab     Status: None   Collection Time: 06/17/20 11:07 PM   Specimen: Nasopharyngeal Swab  Result Value Ref Range Status   SARS Coronavirus 2 NEGATIVE NEGATIVE Final    Comment: (NOTE) SARS-CoV-2 target nucleic acids are NOT DETECTED.  The SARS-CoV-2 RNA is generally detectable in upper and lower respiratory specimens during the acute phase of infection. Negative results do not preclude SARS-CoV-2 infection, do not rule out co-infections with other  pathogens, and should not be used as the sole basis for treatment or other patient management decisions. Negative results must be combined with clinical observations, patient history, and epidemiological information. The expected result is Negative.  Fact Sheet for Patients: SugarRoll.be  Fact Sheet for Healthcare Providers: https://www.woods-mathews.com/  This test is not yet approved or cleared by the [place] and  has been authorized  for detection and/or diagnosis of SARS-CoV-2 by FDA under an Emergency Use Authorization (EUA). This EUA will remain  in effect (meaning this test can be used) for the duration of the COVID-19 declaration under Se ction 564(b)(1) of the Act, 21 U.S.C. section 360bbb-3(b)(1), unless the authorization is terminated or revoked sooner.  Performed at [facility], [place] [address], [place], [place] [address]      Signed: Terrilee Croak  [facility] 06/18/2020, 2:38 PM

## 2020-06-18 NOTE — H&P (Signed)
History and Physical   [facility] [place] [facility] [place] Admission History and Physical [employer], D.O.    Patient Name: Benjamin George MR#: [id number] Date of Birth: 1941-04-01 Date of Admission: 06/17/2020   Primary Care Physician: [name], MD  Chief Complaint:  Chief Complaint  Patient presents with  . Fatigue  Level 5 caveat.  Patient is a poor historian secondary to dementia and altered mental status.  The entirety of the history is obtained from the emergency department provider and the patient's chart.  HPI: Benjamin George is a 80 y.o. male with a known history of atrial fibrillation not on anticoagulation, chronic pain syndrome on Nucynta, hyperlipidemia, IBS presents to the emergency department for evaluation of somnolence.  According to ER records the patient was last seen normal around 10 AM on Friday, March 11.  Wife subsequently found him sleeping and does not believe that he took his note dose of Nucynta as he is unable to open his bottle of pills.    MRI done in the emergency department was questionable for corona radiata stroke but the images were discussed with a neurologist who did not think clinically the MRI is consistent with an acute stroke.  In the emergency department he did have some response to Narcan and was answering questions however on my exam several hours later he is extremely somnolent, will arouse to painful stimuli but does not answer questions or interact.  Review of Systems:  Unable to obtain secondary to dementia and altered mental status  Past Medical History:  Diagnosis Date  . Arthritis   . Atrial fibrillation ([place])   . Cardiac arrest ([place])   . Complication of anesthesia   . Gout   . Gout   . Heart attack ([place])   . Heart disease   . HLD (hyperlipidemia)   . HTN (hypertension)   . Memory disorder 12/21/2015  . PONV (postoperative nausea and vomiting)   . Sleep apnea    NPSG 10/26/07 - AHI 16.4 used5 yrs, uses CPAP  nightly    Past Surgical History:  Procedure Laterality Date  . angioplasty    . APPENDECTOMY    . CAROTID STENT    . CARPECTOMY Left 07/22/2017   Procedure: LEFT WRIST HARDWARE REMOVAL AND PROXIMAL ROW CARPECTOMY;  Surgeon: Milly Jakob, MD;  Location: [facility];  Service: Orthopedics;  Laterality: Left;  . CORONARY ANGIOPLASTY WITH STENT PLACEMENT  06/27/2007   L main OK, LAD mild irreg, CFX stent OK, irreg, RCA 60% calcified, EF 45%   . HAND SURGERY     left   . HARDWARE REMOVAL Left 07/22/2017   Procedure: HARDWARE REMOVAL;  Surgeon: Milly Jakob, MD;  Location: [facility];  Service: Orthopedics;  Laterality: Left;  . LUMBAR LAMINECTOMY/DECOMPRESSION MICRODISCECTOMY N/A 05/06/2013   Procedure: Lumbar 4-5 decompression    1 LEVEL;  Surgeon: Sinclair Ship, MD;  Location: [facility];  Service: Orthopedics;  Laterality: N/A;  Lumbar 4-5 decompression  . nasal septopalsty    . ORIF SCAPHOID FRACTURE Left 01/21/2017   Procedure: OPEN TREATMENT OF LEFT SCAPHOID FRACTURE;  Surgeon: Milly Jakob, MD;  Location: [facility];  Service: Orthopedics;  Laterality: Left;  . TOTAL SHOULDER ARTHROPLASTY  04/17/2011   Procedure: TOTAL SHOULDER ARTHROPLASTY; left Surgeon: Nita Sells, MD;  Location: [facility];  Service: Orthopedics;  Laterality: Left;     reports that he quit smoking about 33 years ago. His smoking use included cigarettes.  He has a 45.00 pack-year smoking history. He has never used smokeless tobacco. He reports current alcohol use of about 16.0 standard drinks of alcohol per week. He reports that he does not use drugs.  Allergies  Allergen Reactions  . Codeine Nausea And Vomiting and Other (See Comments)  . Hydrocodone Nausea And Vomiting  . Aricept [Donepezil Hcl]     Nausea  . Morphine Sulfate Other (See Comments)  . Oxycodone Hcl Other (See Comments)    Dizzy and uneasy  . Pregabalin Other (See Comments)  .  Methocarbamol Nausea Only and Other (See Comments)  . Other Nausea Only and Other (See Comments)    vibramyacin    Family History  Problem Relation Age of Onset  . Heart attack Father   . Heart disease Father   . Hyperlipidemia Father   . Other Father        [name]  . Alzheimer's disease Mother   . Colon cancer Neg Hx   . Esophageal cancer Neg Hx   . Rectal cancer Neg Hx   . Stomach cancer Neg Hx     Prior to Admission medications   Medication Sig Start Date End Date Taking? Authorizing Provider  aspirin 81 MG tablet Take 81 mg by mouth daily.    [provider]  atorvastatin (LIPITOR) 20 MG tablet TAKE 1 TABLET BY MOUTH EVERY DAY 02/20/17   Larey Dresser, MD  linaclotide [facility]) 145 MCG CAPS capsule Take 145 mcg by mouth in the morning and at bedtime.    [provider]  memantine (NAMENDA) 10 MG tablet TAKE 1 TABLET BY MOUTH TWICE A DAY 08/27/19   Ward Givens, NP  Multiple Vitamin (MULTIVITAMIN) tablet Take 1 tablet by mouth every morning.    [provider]  NUCYNTA 50 MG tablet Take 50 mg by mouth in the morning, at noon, and at bedtime. 02/08/20   [provider]  polyethylene glycol (MIRALAX / GLYCOLAX) 17 g packet Take 17 g by mouth daily as needed for mild constipation. Uses it every other day- depending on bowel movements.    [provider]    Physical Exam: Vitals:   06/17/20 2345 06/18/20 0100 06/18/20 0136 06/18/20 0204  BP: (!) 144/72 139/69 (!) 147/74   Pulse: 78 69 72   Resp: 18 15 14    Temp:  97.8 F (36.6 C) 97.6 F (36.4 C)   TempSrc:  Axillary Oral   SpO2: 96% 97% 95%   Weight:    90.9 kg  Height:    6\' 1"  (1.854 m)    GENERAL: 80 y.o.-year-old white male patient, somnolent, snoring,  HEENT: Head atraumatic, normocephalic. Pupils equal. Mucus membranes moist. NECK: Supple. No JVD. CHEST: Normal breath sounds bilaterally. No wheezing, rales, rhonchi or crackles. No use of accessory muscles  of respiration.   CARDIOVASCULAR: S1, S2 normal. No murmurs, rubs, or gallops. Cap refill <2 seconds. Pulses intact distally.  ABDOMEN: Soft, nondistended, nontender.  EXTREMITIES: No pedal edema, cyanosis, or clubbing.  NEUROLOGIC: arouses to painful stimuli and makes eye contact but does not answer questions, follow commands or interact.  Cannot comply with neuro exam SKIN: Warm, dry, and intact without obvious rash, lesion, or ulcer.    Labs on Admission:  CBC: Recent Labs  Lab 06/17/20 1501 06/18/20 0342  WBC 8.3 9.7  NEUTROABS 5.8  --   HGB 13.3 12.8*  HCT 40.5 38.1*  MCV 91.6 91.1  PLT 278 419   Basic Metabolic Panel:  Recent Labs  Lab 06/17/20 1501  NA 138  K 4.7  CL 103  CO2 28  GLUCOSE 103*  BUN 22  CREATININE 0.91  CALCIUM 9.0   GFR: Estimated Creatinine Clearance: 73.2 mL/min (by C-G formula based on SCr of 0.91 mg/dL). Liver Function Tests: Recent Labs  Lab 06/17/20 1501  AST 17  ALT 16  ALKPHOS 76  BILITOT 0.6  PROT 6.5  ALBUMIN 3.9   No results for input(s): LIPASE, AMYLASE in the last 168 hours. Recent Labs  Lab 06/17/20 2309  AMMONIA 26   Coagulation Profile: No results for input(s): INR, PROTIME in the last 168 hours. Cardiac Enzymes: No results for input(s): CKTOTAL, CKMB, CKMBINDEX, TROPONINI in the last 168 hours. BNP (last 3 results) No results for input(s): PROBNP in the last 8760 hours. HbA1C: No results for input(s): HGBA1C in the last 72 hours. CBG: Recent Labs  Lab 06/17/20 1442  GLUCAP 104*   Lipid Profile: No results for input(s): CHOL, HDL, LDLCALC, TRIG, CHOLHDL, LDLDIRECT in the last 72 hours. Thyroid Function Tests: No results for input(s): TSH, T4TOTAL, FREET4, T3FREE, THYROIDAB in the last 72 hours. Anemia Panel: No results for input(s): VITAMINB12, FOLATE, FERRITIN, TIBC, IRON, RETICCTPCT in the last 72 hours. Urine analysis:    Component Value Date/Time   COLORURINE YELLOW 06/17/2020 1540   APPEARANCEUR  CLEAR 06/17/2020 1540   LABSPEC 1.016 06/17/2020 1540   PHURINE 6.0 06/17/2020 1540   GLUCOSEU NEGATIVE 06/17/2020 1540   HGBUR NEGATIVE 06/17/2020 1540   BILIRUBINUR NEGATIVE 06/17/2020 1540   KETONESUR NEGATIVE 06/17/2020 1540   PROTEINUR NEGATIVE 06/17/2020 1540   UROBILINOGEN 0.2 04/27/2013 0940   NITRITE NEGATIVE 06/17/2020 1540   LEUKOCYTESUR NEGATIVE 06/17/2020 1540   Sepsis Labs: @LABRCNTIP (procalcitonin:4,lacticidven:4) )No results found for this or any previous visit (from the past 240 hour(s)).   Radiological Exams on Admission: DG Chest 1 View  Result Date: 06/17/2020 CLINICAL DATA:  Altered mental status EXAM: CHEST  1 VIEW COMPARISON:  05/07/2020 FINDINGS: Stable cardiomediastinal contours. Atherosclerotic calcification of the aortic knob. No focal airspace consolidation, pleural effusion, or pneumothorax. Left shoulder arthroplasty. IMPRESSION: No active disease. Electronically Signed   By: Davina Poke D.O.   On: 06/17/2020 15:14   CT Head Wo Contrast  Result Date: 06/17/2020 CLINICAL DATA:  Altered mental status. EXAM: CT HEAD WITHOUT CONTRAST TECHNIQUE: Contiguous axial images were obtained from the base of the skull through the vertex without intravenous contrast. COMPARISON:  September 08, 2018 FINDINGS: Brain: There is moderate severity cerebral atrophy with widening of the extra-axial spaces and ventricular dilatation. There are areas of decreased attenuation within the white matter tracts of the supratentorial brain, consistent with microvascular disease changes. Vascular: No hyperdense vessel or unexpected calcification. Skull: Normal. Negative for fracture or focal lesion. Sinuses/Orbits: Chronic, marked severity bilateral anterior nasal mucosal thickening is seen. Other: None. IMPRESSION: 1. Generalized cerebral atrophy. 2. No acute intracranial abnormality. Electronically Signed   By: Virgina Norfolk M.D.   On: 06/17/2020 15:41   MR BRAIN WO CONTRAST  Result  Date: 06/17/2020 CLINICAL DATA:  Encephalopathy EXAM: MRI HEAD WITHOUT CONTRAST TECHNIQUE: Multiplanar, multiecho pulse sequences of the brain and surrounding structures were obtained without intravenous contrast. COMPARISON:  None. FINDINGS: Brain: The examination is severely degraded by motion. There is a punctate focus of abnormal diffusion restriction in the left corona radiata (series 5, image 91). No other diffusion abnormality. No acute hemorrhage. Confluent hyperintense T2-weighted white matter signal. Generalized volume loss without a clear lobar  predilection. The midline structures are normal. Vascular: Major flow voids are preserved. Skull and upper cervical spine: Normal calvarium and skull base. Visualized upper cervical spine and soft tissues are normal. Sinuses/Orbits:No paranasal sinus fluid levels or advanced mucosal thickening. No mastoid or middle ear effusion. Normal orbits. IMPRESSION: 1. Severely motion degraded examination. 2. Punctate focus of abnormal diffusion restriction in the left corona radiata, consistent with a small acute to subacute infarct. No hemorrhage or mass effect. 3. Confluent hyperintense T2-weighted white matter signal, consistent with chronic small vessel ischemic disease. Electronically Signed   By: Ulyses Jarred M.D.   On: 06/17/2020 22:25    EKG: Normal sinus rhythm at 80 bpm with normal axis and nonspecific ST-T wave changes.   Assessment/Plan  This is a 80 y.o. male with a history of atrial fibrillation, dementia, hyperlipidemia, IBS, chronic pain syndrome now being admitted with:  #.  Altered mental status unclear etiology Patient does not appear to have any signs or symptoms of infection.  He also does not appear to be hyperammonemic or hypercarbic .  His alteration may be related to medication -Admit inpatient telemetry -Neurochecks -Seizure and aspiration precautions -N.p.o. until able to pass swallow exam  #.  History of atrial  fibrillation -Hold aspirin  #.  History of hyperlipidemia -Hold atorvastatin  #. History of IBS -Hold Linzess  #. History of dementia -Hold Namenda  #.  History of chronic pain syndrome -Hold Nucynta  Admission status: Inpatient IV Fluids: Normal saline Diet/Nutrition: N.p.o. Consults called: Neuro consulted by EDP DVT Px: Lovenox, SCDs and early ambulation. Code Status: Full Code -we will need to clarify with wife in a.m. Disposition Plan: To home in to be determined  All the records are reviewed and case discussed with ED provider. Management plans discussed with the patient and/or family who express understanding and agree with plan of care.  [employer] D.O. on 06/18/2020 at 4:39 AM CC: Primary care physician; [name], MD   06/18/2020, 4:39 AM

## 2020-06-20 DIAGNOSIS — G9349 Other encephalopathy: Secondary | ICD-10-CM | POA: Diagnosis not present

## 2020-06-20 DIAGNOSIS — M109 Gout, unspecified: Secondary | ICD-10-CM | POA: Diagnosis not present

## 2020-06-20 DIAGNOSIS — M4800 Spinal stenosis, site unspecified: Secondary | ICD-10-CM | POA: Diagnosis not present

## 2020-06-20 DIAGNOSIS — U071 COVID-19: Secondary | ICD-10-CM | POA: Diagnosis not present

## 2020-06-20 DIAGNOSIS — J1281 Pneumonia due to SARS-associated coronavirus: Secondary | ICD-10-CM | POA: Diagnosis not present

## 2020-06-20 DIAGNOSIS — M6281 Muscle weakness (generalized): Secondary | ICD-10-CM | POA: Diagnosis not present

## 2020-06-20 DIAGNOSIS — Z7409 Other reduced mobility: Secondary | ICD-10-CM | POA: Diagnosis not present

## 2020-06-20 DIAGNOSIS — E785 Hyperlipidemia, unspecified: Secondary | ICD-10-CM | POA: Diagnosis not present

## 2020-06-20 DIAGNOSIS — I2510 Atherosclerotic heart disease of native coronary artery without angina pectoris: Secondary | ICD-10-CM | POA: Diagnosis not present

## 2020-06-21 DIAGNOSIS — G9349 Other encephalopathy: Secondary | ICD-10-CM | POA: Diagnosis not present

## 2020-06-21 DIAGNOSIS — M4800 Spinal stenosis, site unspecified: Secondary | ICD-10-CM | POA: Diagnosis not present

## 2020-06-21 DIAGNOSIS — Z7409 Other reduced mobility: Secondary | ICD-10-CM | POA: Diagnosis not present

## 2020-06-21 DIAGNOSIS — E785 Hyperlipidemia, unspecified: Secondary | ICD-10-CM | POA: Diagnosis not present

## 2020-06-21 DIAGNOSIS — M6281 Muscle weakness (generalized): Secondary | ICD-10-CM | POA: Diagnosis not present

## 2020-06-21 DIAGNOSIS — J1281 Pneumonia due to SARS-associated coronavirus: Secondary | ICD-10-CM | POA: Diagnosis not present

## 2020-06-21 DIAGNOSIS — U071 COVID-19: Secondary | ICD-10-CM | POA: Diagnosis not present

## 2020-06-21 DIAGNOSIS — M109 Gout, unspecified: Secondary | ICD-10-CM | POA: Diagnosis not present

## 2020-06-21 DIAGNOSIS — I2510 Atherosclerotic heart disease of native coronary artery without angina pectoris: Secondary | ICD-10-CM | POA: Diagnosis not present

## 2020-06-28 DIAGNOSIS — M6281 Muscle weakness (generalized): Secondary | ICD-10-CM | POA: Diagnosis not present

## 2020-06-28 DIAGNOSIS — M109 Gout, unspecified: Secondary | ICD-10-CM | POA: Diagnosis not present

## 2020-06-28 DIAGNOSIS — E785 Hyperlipidemia, unspecified: Secondary | ICD-10-CM | POA: Diagnosis not present

## 2020-06-28 DIAGNOSIS — Z7409 Other reduced mobility: Secondary | ICD-10-CM | POA: Diagnosis not present

## 2020-06-28 DIAGNOSIS — I2510 Atherosclerotic heart disease of native coronary artery without angina pectoris: Secondary | ICD-10-CM | POA: Diagnosis not present

## 2020-06-28 DIAGNOSIS — U071 COVID-19: Secondary | ICD-10-CM | POA: Diagnosis not present

## 2020-06-28 DIAGNOSIS — M4800 Spinal stenosis, site unspecified: Secondary | ICD-10-CM | POA: Diagnosis not present

## 2020-06-28 DIAGNOSIS — J1281 Pneumonia due to SARS-associated coronavirus: Secondary | ICD-10-CM | POA: Diagnosis not present

## 2020-06-28 DIAGNOSIS — G9349 Other encephalopathy: Secondary | ICD-10-CM | POA: Diagnosis not present

## 2020-07-05 DIAGNOSIS — N201 Calculus of ureter: Secondary | ICD-10-CM | POA: Diagnosis not present

## 2020-07-06 DIAGNOSIS — I2510 Atherosclerotic heart disease of native coronary artery without angina pectoris: Secondary | ICD-10-CM | POA: Diagnosis not present

## 2020-07-06 DIAGNOSIS — M47816 Spondylosis without myelopathy or radiculopathy, lumbar region: Secondary | ICD-10-CM | POA: Diagnosis not present

## 2020-07-06 DIAGNOSIS — M5136 Other intervertebral disc degeneration, lumbar region: Secondary | ICD-10-CM | POA: Diagnosis not present

## 2020-07-06 DIAGNOSIS — E782 Mixed hyperlipidemia: Secondary | ICD-10-CM | POA: Diagnosis not present

## 2020-07-06 DIAGNOSIS — M5137 Other intervertebral disc degeneration, lumbosacral region: Secondary | ICD-10-CM | POA: Diagnosis not present

## 2020-07-06 DIAGNOSIS — I10 Essential (primary) hypertension: Secondary | ICD-10-CM | POA: Diagnosis not present

## 2020-07-06 DIAGNOSIS — G894 Chronic pain syndrome: Secondary | ICD-10-CM | POA: Diagnosis not present

## 2020-07-08 NOTE — Progress Notes (Signed)
HPI male former smoker followed for management of OSA, complicated by allergic rhinitis, CAD/ MI, morbid obesity, insomnia NPSG-10/16/2007-AHI 16.4/hour, desaturation to 83%. Periodic limb movement with arousal 8.2/hour  ---------------------------------------------------------------------------------   05/07/19- 80 year old male former smoker followed for management of OSA, complicated by allergic rhinitis, CAD/ MI, morbid obesity, insomnia CPAP auto 5-15/[facility] in [facility] for CAP, then ED for confusion with hypotension Wife with him today. CT head 09/08/2018- probable nasal polyposis, also atrophy and vascular changes Download compliance 97%, AHI 2.5/ hr Body weight today 232 lbs Wife says he is doing fine with CPAP. Machine may be old- will replace if eligible Wife notes dx dementia now CXR 09/08/2018-  Atelectasis in the left base.  No other acute abnormalities.  07/11/20- 80 year old male former smoker( 66 pk yrs) followed for management of OSA, complicated by allergic rhinitis, CAD/ MI, morbid obesity, insomnia, Dementia, Gout, Dyslipidemia, Covid Pneumonia Jan 2022,  CPAP auto 5-15/[employer]   AirSense 10 AutoSet Download- compliance 23%, AHI 2.9/ hr  Mostly short nights. Body weight today-208 lbs Covid vax- 3 Phizer Flu vax-had Hosp January- fever, encephalopathy, Covid pneumonia Hosp 3/11-3/12/22- Altered Mental Status -----Wears CPAP-takes off sometimes during the night. Wife here. She is open about his dementia and he understands- hides face in hands. They are in separate rooms. He won't keep mask on. Quite drowsy in day. His pain doctor added modafinil, trying to keep him up in day so he will sleep at night. Not clear if this is helping yet. CXR 06/17/20 1V-  IMPRESSION: No active disease.   ROS-see HPI   + = positive Constitutional:    weight loss, night sweats, fevers, chills, fatigue, lassitude. HEENT:    headaches, difficulty swallowing, tooth/dental problems, sore throat,        sneezing, itching, +ear ache, nasal congestion, post nasal drip, snoring CV:    chest pain, orthopnea, PND, swelling in lower extremities, anasarca,                                   dizziness, +palpitations Resp:   +shortness of breath with exertion or at rest.                productive cough,   +non-productive cough, coughing up of blood.              change in color of mucus.  wheezing.   Skin:    rash or lesions. GI:  No-   heartburn, indigestion, abdominal pain, nausea, vomiting, diarrhea,                 change in bowel habits, loss of appetite GU: dysuria, change in color of urine, no urgency or frequency.   flank pain. MS:   joint pain, stiffness, decreased range of motion, back pain. Neuro-     nothing unusual Psych:  change in mood or affect.  depression or anxiety.   +memory loss.  OBJ- Physical Exam General- Alert, Oriented, Affect-appropriate, Distress- none acute, + obese,  + wheelchair Skin- rash-none, lesions- none, excoriation- none Lymphadenopathy- none Head- atraumatic            Eyes- Gross vision intact, PERRLA, conjunctivae and secretions clear            Ears- Hearing, canals-normal            Nose- Clear, no-Septal dev, mucus, polyps, erosion, perforation  Throat- Mallampati III , mucosa clear , drainage- none, tonsils- atrophic Neck- flexible , trachea midline, no stridor , thyroid nl, carotid no bruit Chest - symmetrical excursion , unlabored           Heart/CV- RRR , no murmur , no gallop  , no rub, nl s1 s2                           - JVD- none , edema- none, stasis changes- none, varices- none           Lung- clear to P&A, wheeze- none, cough- none , dullness-none, rub- none           Chest wall-  Abd-  Br/ Gen/ Rectal- Not done, not indicated Extrem- cyanosis- none, clubbing, none, atrophy- none, strength- nl Neuro- + halting speech and word searching

## 2020-07-11 ENCOUNTER — Other Ambulatory Visit: Payer: Self-pay

## 2020-07-11 ENCOUNTER — Ambulatory Visit: Payer: Medicare Other | Admitting: Internal Medicine

## 2020-07-11 ENCOUNTER — Encounter: Payer: Self-pay | Admitting: Internal Medicine

## 2020-07-11 DIAGNOSIS — G4733 Obstructive sleep apnea (adult) (pediatric): Secondary | ICD-10-CM | POA: Diagnosis not present

## 2020-07-11 DIAGNOSIS — I10 Essential (primary) hypertension: Secondary | ICD-10-CM | POA: Diagnosis not present

## 2020-07-11 DIAGNOSIS — F0390 Unspecified dementia without behavioral disturbance: Secondary | ICD-10-CM | POA: Diagnosis not present

## 2020-07-11 NOTE — Patient Instructions (Signed)
We can continue CPAP auto 5-15  If you don't see any improvement in daytime sleepiness with modafinil, try increasing to 2 tabs (200 mg) each morning.   Please call if we can help

## 2020-08-05 ENCOUNTER — Encounter: Payer: Self-pay | Admitting: Internal Medicine

## 2020-08-05 NOTE — Assessment & Plan Note (Signed)
Becoming more disruptive, but wife is dutiful, trying to support and care for him.

## 2020-08-05 NOTE — Assessment & Plan Note (Signed)
With his dementia, he is having trouble keeping mask on during the night- takes it off.  Plan- wife and I agreed to keep on, just doing the best they can. We think he benefits from CPAP when used.

## 2020-08-15 ENCOUNTER — Other Ambulatory Visit: Payer: Self-pay | Admitting: Adult Health

## 2020-08-15 DIAGNOSIS — M5137 Other intervertebral disc degeneration, lumbosacral region: Secondary | ICD-10-CM | POA: Diagnosis not present

## 2020-08-15 DIAGNOSIS — M5136 Other intervertebral disc degeneration, lumbar region: Secondary | ICD-10-CM | POA: Diagnosis not present

## 2020-08-15 DIAGNOSIS — G894 Chronic pain syndrome: Secondary | ICD-10-CM | POA: Diagnosis not present

## 2020-08-15 DIAGNOSIS — M47816 Spondylosis without myelopathy or radiculopathy, lumbar region: Secondary | ICD-10-CM | POA: Diagnosis not present

## 2020-08-17 ENCOUNTER — Ambulatory Visit: Payer: Medicare Other | Admitting: Adult Health

## 2020-08-18 ENCOUNTER — Ambulatory Visit: Payer: Medicare Other | Admitting: Adult Health

## 2020-08-28 ENCOUNTER — Emergency Department (HOSPITAL_COMMUNITY): Payer: Medicare Other

## 2020-08-28 ENCOUNTER — Emergency Department (HOSPITAL_COMMUNITY)
Admission: EM | Admit: 2020-08-28 | Discharge: 2020-08-28 | Disposition: A | Discharge status: Home / Self Care | Payer: Medicare Other | Attending: Emergency Medicine | Admitting: Emergency Medicine

## 2020-08-28 ENCOUNTER — Encounter (HOSPITAL_COMMUNITY): Payer: Self-pay

## 2020-08-28 ENCOUNTER — Other Ambulatory Visit: Payer: Self-pay

## 2020-08-28 DIAGNOSIS — R531 Weakness: Secondary | ICD-10-CM | POA: Diagnosis not present

## 2020-08-28 DIAGNOSIS — Z20822 Contact with and (suspected) exposure to covid-19: Secondary | ICD-10-CM | POA: Insufficient documentation

## 2020-08-28 DIAGNOSIS — Z96612 Presence of left artificial shoulder joint: Secondary | ICD-10-CM | POA: Insufficient documentation

## 2020-08-28 DIAGNOSIS — I2510 Atherosclerotic heart disease of native coronary artery without angina pectoris: Secondary | ICD-10-CM | POA: Insufficient documentation

## 2020-08-28 DIAGNOSIS — R0902 Hypoxemia: Secondary | ICD-10-CM | POA: Diagnosis not present

## 2020-08-28 DIAGNOSIS — F0390 Unspecified dementia without behavioral disturbance: Secondary | ICD-10-CM | POA: Insufficient documentation

## 2020-08-28 DIAGNOSIS — R4182 Altered mental status, unspecified: Secondary | ICD-10-CM | POA: Diagnosis not present

## 2020-08-28 DIAGNOSIS — I10 Essential (primary) hypertension: Secondary | ICD-10-CM | POA: Insufficient documentation

## 2020-08-28 DIAGNOSIS — Z8616 Personal history of COVID-19: Secondary | ICD-10-CM | POA: Insufficient documentation

## 2020-08-28 DIAGNOSIS — Z87891 Personal history of nicotine dependence: Secondary | ICD-10-CM | POA: Diagnosis not present

## 2020-08-28 DIAGNOSIS — Z79899 Other long term (current) drug therapy: Secondary | ICD-10-CM | POA: Insufficient documentation

## 2020-08-28 DIAGNOSIS — Z7982 Long term (current) use of aspirin: Secondary | ICD-10-CM | POA: Insufficient documentation

## 2020-08-28 DIAGNOSIS — M549 Dorsalgia, unspecified: Secondary | ICD-10-CM | POA: Diagnosis not present

## 2020-08-28 DIAGNOSIS — I499 Cardiac arrhythmia, unspecified: Secondary | ICD-10-CM | POA: Diagnosis not present

## 2020-08-28 DIAGNOSIS — Z743 Need for continuous supervision: Secondary | ICD-10-CM | POA: Diagnosis not present

## 2020-08-28 DIAGNOSIS — R6889 Other general symptoms and signs: Secondary | ICD-10-CM | POA: Diagnosis not present

## 2020-08-28 LAB — URINALYSIS, ROUTINE W REFLEX MICROSCOPIC
Bilirubin Urine: NEGATIVE
Glucose, UA: NEGATIVE mg/dL
Hgb urine dipstick: NEGATIVE
Ketones, ur: NEGATIVE mg/dL
Leukocytes,Ua: NEGATIVE
Nitrite: NEGATIVE
Protein, ur: NEGATIVE mg/dL
Specific Gravity, Urine: 1.01 (ref 1.005–1.030)
pH: 6 (ref 5.0–8.0)

## 2020-08-28 LAB — COMPREHENSIVE METABOLIC PANEL
ALT: 15 U/L (ref 0–44)
AST: 14 U/L — ABNORMAL LOW (ref 15–41)
Albumin: 3.6 g/dL (ref 3.5–5.0)
Alkaline Phosphatase: 74 U/L (ref 38–126)
Anion gap: 5 (ref 5–15)
BUN: 19 mg/dL (ref 8–23)
CO2: 24 mmol/L (ref 22–32)
Calcium: 8.4 mg/dL — ABNORMAL LOW (ref 8.9–10.3)
Chloride: 102 mmol/L (ref 98–111)
Creatinine, Ser: 1.21 mg/dL (ref 0.61–1.24)
GFR, Estimated: >60 mL/min (ref >60)
Glucose, Bld: 129 mg/dL — ABNORMAL HIGH (ref 70–99)
Potassium: 3.9 mmol/L (ref 3.5–5.1)
Sodium: 131 mmol/L — ABNORMAL LOW (ref 135–145)
Total Bilirubin: 0.4 mg/dL (ref 0.3–1.2)
Total Protein: 6.2 g/dL — ABNORMAL LOW (ref 6.5–8.1)

## 2020-08-28 LAB — RESP PANEL BY RT-PCR (FLU A&B, COVID) ARPGX2
Influenza A by PCR: NEGATIVE
Influenza B by PCR: NEGATIVE
SARS Coronavirus 2 by RT PCR: NEGATIVE

## 2020-08-28 LAB — PROTIME-INR
INR: 1 (ref 0.8–1.2)
Prothrombin Time: 13.5 seconds (ref 11.4–15.2)

## 2020-08-28 LAB — CBC WITH DIFFERENTIAL/PLATELET
Abs Immature Granulocytes: 0.03 10*3/uL (ref 0.00–0.07)
Basophils Absolute: 0 10*3/uL (ref 0.0–0.1)
Basophils Relative: 0 %
Eosinophils Absolute: 0 10*3/uL (ref 0.0–0.5)
Eosinophils Relative: 0 %
HCT: 39.5 % (ref 39.0–52.0)
Hemoglobin: 13.4 g/dL (ref 13.0–17.0)
Immature Granulocytes: 0 %
Lymphocytes Relative: 4 %
Lymphs Abs: 0.5 10*3/uL — ABNORMAL LOW (ref 0.7–4.0)
MCH: 30.2 pg (ref 26.0–34.0)
MCHC: 33.9 g/dL (ref 30.0–36.0)
MCV: 89.2 fL (ref 80.0–100.0)
Monocytes Absolute: 0.7 10*3/uL (ref 0.1–1.0)
Monocytes Relative: 6 %
Neutro Abs: 11.3 10*3/uL — ABNORMAL HIGH (ref 1.7–7.7)
Neutrophils Relative %: 90 %
Platelets: 249 10*3/uL (ref 150–400)
RBC: 4.43 MIL/uL (ref 4.22–5.81)
RDW: 12.8 % (ref 11.5–15.5)
WBC: 12.6 10*3/uL — ABNORMAL HIGH (ref 4.0–10.5)
nRBC: 0 % (ref 0.0–0.2)

## 2020-08-28 LAB — BLOOD GAS, VENOUS
Acid-Base Excess: 0.5 mmol/L (ref 0.0–2.0)
Bicarbonate: 24.6 mmol/L (ref 20.0–28.0)
FIO2: 21
O2 Saturation: 90.4 %
Patient temperature: 98.6
pCO2, Ven: 40.1 mmHg — ABNORMAL LOW (ref 44.0–60.0)
pH, Ven: 7.405 (ref 7.250–7.430)
pO2, Ven: 60.2 mmHg — ABNORMAL HIGH (ref 32.0–45.0)

## 2020-08-28 LAB — LIPASE, BLOOD: Lipase: 26 U/L (ref 11–51)

## 2020-08-28 LAB — ETHANOL: Alcohol, Ethyl (B): <10 mg/dL (ref <10)

## 2020-08-28 LAB — TSH: TSH: 0.449 u[IU]/mL (ref 0.350–4.500)

## 2020-08-28 LAB — TROPONIN I (HIGH SENSITIVITY): Troponin I (High Sensitivity): 4 ng/L (ref <18)

## 2020-08-28 LAB — LACTIC ACID, PLASMA: Lactic Acid, Venous: 1.3 mmol/L (ref 0.5–1.9)

## 2020-08-28 LAB — BRAIN NATRIURETIC PEPTIDE: B Natriuretic Peptide: 69.1 pg/mL (ref 0.0–100.0)

## 2020-08-28 MED ORDER — LACTATED RINGERS IV SOLN: INTRAVENOUS | Status: DC

## 2020-08-28 MED ORDER — SODIUM CHLORIDE 0.9 % IV BOLUS (SEPSIS): 1000.0000 mL | Freq: Once | INTRAVENOUS | Status: DC

## 2020-08-28 MED ORDER — SODIUM CHLORIDE 0.9 % IV SOLN: 1000.0000 mL | INTRAVENOUS | Status: DC

## 2020-08-28 MED ORDER — SODIUM CHLORIDE 0.9 % IV SOLN
Freq: Once | INTRAVENOUS | Status: AC
  Administered 2020-08-28: 1000 mL via INTRAVENOUS

## 2020-08-28 NOTE — ED Notes (Signed)
To Ct 

## 2020-08-28 NOTE — ED Triage Notes (Signed)
EMS reports from home, Per wife increased AMS and weakness. Wife states she gave pain medication (Nucynta) at 1400 and shortly after symptoms began. Hx of dementia Baseline is A&O X 2.  BP 120/61 HR106 RR 16 Sp02 97 RA CBG 121  18 RAC  500 ml NS

## 2020-08-28 NOTE — Discharge Instructions (Signed)
1.  At this time there is no obvious reason for the confusion that you experienced earlier today. 2.  Continue to observe for any changes or signs of illness.  Measure your temperature for the next 48 hours a couple times a day.  If you have a fever, schedule an appointment to see your doctor as soon as possible.  If you have a fever and you are confused or having other symptoms, return to the emergency department. 3.  At this time you may return to usual activities if you are feeling well.

## 2020-08-28 NOTE — ED Provider Notes (Signed)
[facility] DEPT Provider Note   CSN: [id number] Arrival date & time: 08/28/20  1750     History Chief Complaint  Patient presents with  . Altered Mental Status  . Weakness    Benjamin George is a 80 y.o. male.  HPI Patient's wife reports that they had gone to visit their daughter.  Patient has dementia so he always rides as passenger.  The drive had been approximately 4 hours.  Patient's wife reports just a couple miles from home, and he started giving her a lot of instructions on where to turn and go, he has dementia so these were incorrect she advised him they were just a couple miles from home and they would be getting out soon.  At 2pm, they pulled in drive.  Patient seemed unusually weak and confused getting out of car but nonfocal.  He did not have any focal weakness numbness or tingling.  He needed additional guidance and assistance getting back into the house, which, with his dementia is not completely unusual but it was just more than baseline.  His wife checked Bp was123/69.  She gave some food.and then shortly later she gave a pain pill, nucynta.  She does feel that the symptoms of the confusion were developing a little bit more before the Nucynta.  Typically, she gets the Nucynta in the morning and 1 in the evening.  Occasionally if he complains of more extensive pain she gives 1 additional dose.  He has been taking this medication for about 2 months.  He then walked down hall to bathroom, but didn't come out. He was having a BM and then started to slouch over, confused and weak. Wife called EMS.  Patient did not collapse to the ground or vomit.    Past Medical History:  Diagnosis Date  . Arthritis   . Atrial fibrillation ([place])   . Cardiac arrest ([place])   . Complication of anesthesia   . Gout   . Gout   . Heart attack ([place])   . Heart disease   . HLD (hyperlipidemia)   . HTN (hypertension)   . Memory disorder 12/21/2015  . PONV (postoperative  nausea and vomiting)   . Sleep apnea    NPSG 10/26/07 - AHI 16.4 used5 yrs, uses CPAP nightly    Patient Active Problem List   Diagnosis Date Noted  . AMS (altered mental status) 06/18/2020  . Altered mental status 06/17/2020  . Pneumonia due to COVID-19 virus 05/08/2020  . Encephalopathy due to COVID-19 virus 05/08/2020  . Dementia ([place]) 05/08/2020  . Decreased ambulation status 05/07/2020  . Impaired ambulation 05/07/2020  . Fever   . Weakness generalized   . Hypotension   . CAP (community acquired pneumonia) 05/13/2018  . Community acquired pneumonia of left lower lobe of lung 05/12/2018  . Ischemic cardiomyopathy 04/18/2017  . Memory disorder 12/21/2015  . Spinal stenosis 05/06/2013  . Palpitations 03/02/2013  . Morbid obesity ([place]) 12/12/2011  . Arthritis of shoulder region, 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  . CAD (coronary artery disease) 10/24/2010  . Preoperative evaluation to rule out surgical contraindication 10/24/2010  . Seasonal and perennial allergic rhinitis 05/17/2010  . INSOMNIA 05/17/2010  . Hyperlipidemia LDL goal <70 04/13/2009  . GOUT 04/13/2009  . Obstructive sleep apnea 04/13/2009  . Essential hypertension 04/13/2009  . HEART ATTACK 04/13/2009  . CARDIAC ARREST 04/13/2009    Past Surgical History:  Procedure Laterality Date  . angioplasty    . APPENDECTOMY    .  CAROTID STENT    . CARPECTOMY Left 07/22/2017   Procedure: LEFT WRIST HARDWARE REMOVAL AND PROXIMAL ROW CARPECTOMY;  Surgeon: Milly Jakob, MD;  Location: [facility];  Service: Orthopedics;  Laterality: Left;  . CORONARY ANGIOPLASTY WITH STENT PLACEMENT  06/27/2007   L main OK, LAD mild irreg, CFX stent OK, irreg, RCA 60% calcified, EF 45%   . HAND SURGERY     left   . HARDWARE REMOVAL Left 07/22/2017   Procedure: HARDWARE REMOVAL;  Surgeon: Milly Jakob, MD;  Location: [facility];  Service: Orthopedics;  Laterality: Left;  . LUMBAR LAMINECTOMY/DECOMPRESSION  MICRODISCECTOMY N/A 05/06/2013   Procedure: Lumbar 4-5 decompression    1 LEVEL;  Surgeon: Sinclair Ship, MD;  Location: [facility];  Service: Orthopedics;  Laterality: N/A;  Lumbar 4-5 decompression  . nasal septopalsty    . ORIF SCAPHOID FRACTURE Left 01/21/2017   Procedure: OPEN TREATMENT OF LEFT SCAPHOID FRACTURE;  Surgeon: Milly Jakob, MD;  Location: [facility];  Service: Orthopedics;  Laterality: Left;  . TOTAL SHOULDER ARTHROPLASTY  04/17/2011   Procedure: TOTAL SHOULDER ARTHROPLASTY; left Surgeon: Nita Sells, MD;  Location: [facility];  Service: Orthopedics;  Laterality: Left;       Family History  Problem Relation Age of Onset  . Heart attack Father   . Heart disease Father   . Hyperlipidemia Father   . Other Father        [name]  . Alzheimer's disease Mother   . Colon cancer Neg Hx   . Esophageal cancer Neg Hx   . Rectal cancer Neg Hx   . Stomach cancer Neg Hx     Social History   Tobacco Use  . Smoking status: Former Smoker    Packs/day: 1.50    Years: 30.00    Pack years: 45.00    Types: Cigarettes    Quit date: 04/28/1987    Years since quitting: 33.3  . Smokeless tobacco: Never Used  . Tobacco comment: smoked 2 ppd for 32 years; quit in 1989  Vaping Use  . Vaping Use: Never used  Substance Use Topics  . Alcohol use: Yes    Alcohol/week: 16.0 standard drinks    Types: 14 Cans of beer, 2 Shots of liquor per week    Comment: [place] of beers per day  . Drug use: No    Home Medications Prior to Admission medications   Medication Sig Start Date End Date Taking? Authorizing Provider  aspirin 81 MG tablet Take 81 mg by mouth daily.    [provider]  atorvastatin (LIPITOR) 20 MG tablet TAKE 1 TABLET BY MOUTH EVERY DAY 02/20/17   Larey Dresser, MD  linaclotide [facility]) 145 MCG CAPS capsule Take 145 mcg by mouth in the morning and at bedtime.    [provider]  memantine (NAMENDA) 10 MG tablet TAKE 1  TABLET BY MOUTH TWICE A DAY 08/15/20   Ward Givens, NP  modafinil (PROVIGIL) 100 MG tablet Take 100 mg by mouth daily.    [provider]  Multiple Vitamin (MULTIVITAMIN) tablet Take 1 tablet by mouth every morning.    [provider]  NUCYNTA 50 MG tablet Take 50 mg by mouth in the morning, at noon, and at bedtime. 02/08/20   [provider]  polyethylene glycol (MIRALAX / GLYCOLAX) 17 g packet Take 17 g by mouth daily as needed for mild constipation. Uses it every other day- depending on bowel movements.  [provider]    Allergies    Codeine, Hydrocodone, Aricept [donepezil hcl], Morphine sulfate, Oxycodone hcl, Pregabalin, Methocarbamol, and Other  Review of Systems   Review of Systems 10 systems reviewed and negative except as per HPI Physical Exam Updated Vital Signs BP 127/73   Pulse 91   Temp 98 F (36.7 C) (Oral)   Resp 14   SpO2 96%   Physical Exam Constitutional:      Comments: Patient is alert.  He does not seem to be in distress.  He does help some and moving about in the stretcher.  No focal motor deficits.  No respiratory distress  HENT:     Head: Normocephalic and atraumatic.     Mouth/Throat:     Mouth: Mucous membranes are moist.     Pharynx: Oropharynx is clear.  Eyes:     Extraocular Movements: Extraocular movements intact.     Pupils: Pupils are equal, round, and reactive to light.  Cardiovascular:     Rate and Rhythm: Normal rate and regular rhythm.  Pulmonary:     Effort: Pulmonary effort is normal.     Breath sounds: Normal breath sounds.  Abdominal:     General: There is no distension.     Palpations: Abdomen is soft.     Tenderness: There is no abdominal tenderness. There is no guarding.  Musculoskeletal:        General: No swelling or tenderness.  Skin:    General: Skin is warm and dry.  Neurological:     Comments: Patient does not have good recall.  He has however verbally interactive.  Speech is  clear.  He can assist in following commands.  No focal motor deficits.  He has strength to elevate himself and push up into the stretcher.  Psychiatric:        Mood and Affect: Mood normal.     ED Results / Procedures / Treatments   Labs (all labs ordered are listed, but only abnormal results are displayed) Labs Reviewed  COMPREHENSIVE METABOLIC PANEL - Abnormal; Notable for the following components:      Result Value   Sodium 131 (*)    Glucose, Bld 129 (*)    Calcium 8.4 (*)    Total Protein 6.2 (*)    AST 14 (*)    All other components within normal limits  CBC WITH DIFFERENTIAL/PLATELET - Abnormal; Notable for the following components:   WBC 12.6 (*)    Neutro Abs 11.3 (*)    Lymphs Abs 0.5 (*)    All other components within normal limits  URINALYSIS, ROUTINE W REFLEX MICROSCOPIC - Abnormal; Notable for the following components:   Color, Urine STRAW (*)    All other components within normal limits  BLOOD GAS, VENOUS - Abnormal; Notable for the following components:   pCO2, Ven 40.1 (*)    pO2, Ven 60.2 (*)    All other components within normal limits  RESP PANEL BY RT-PCR (FLU A&B, COVID) ARPGX2  ETHANOL  LIPASE, BLOOD  BRAIN NATRIURETIC PEPTIDE  LACTIC ACID, PLASMA  PROTIME-INR  TSH  LACTIC ACID, PLASMA  TROPONIN I (HIGH SENSITIVITY)  TROPONIN I (HIGH SENSITIVITY)    EKG None  Radiology CT Head Wo Contrast  Result Date: 08/28/2020 CLINICAL DATA:  Altered mental status, progressive dementia EXAM: CT HEAD WITHOUT CONTRAST TECHNIQUE: Contiguous axial images were obtained from the base of the skull through the vertex without intravenous contrast. COMPARISON:  CT and MRI 06/17/2020 FINDINGS: Brain:  Normal anatomic configuration. Parenchymal volume loss is commensurate with the patient's age. Extensive subcortical and periventricular white matter changes are present likely reflecting the sequela of small vessel ischemia, stable since prior exam. No abnormal intra or  extra-axial mass lesion or fluid collection. No abnormal mass effect or midline shift. No evidence of acute intracranial hemorrhage or infarct. There is borderline enlargement of the ventricles, commensurate with the degree of cerebral parenchymal atrophy and likely reflecting the sequela of central atrophy. Cerebellum unremarkable. Vascular: No asymmetric hyperdense vasculature at the skull base. Skull: Intact Sinuses/Orbits: There is marked mucosal thickening involving the mucosa of the middle turbinates bilaterally, left greater than right, similar to that noted on prior examination. Paranasal sinuses are clear. Orbits are unremarkable. Other: Mastoid air cells and middle ear cavities are clear. IMPRESSION: Stable examination. No acute intracranial abnormality identified. Advanced senescent change. Electronically Signed   By: Fidela Salisbury MD   On: 08/28/2020 21:13   DG Chest Port 1 View  Result Date: 08/28/2020 CLINICAL DATA:  Altered mental status, weakness EXAM: PORTABLE CHEST 1 VIEW COMPARISON:  06/17/2020 FINDINGS: Stable cardiomediastinal contours. Atherosclerotic calcification of the aortic knob. No focal airspace consolidation, pleural effusion, or pneumothorax. Left shoulder arthroplasty. IMPRESSION: No active disease. Electronically Signed   By: Davina Poke D.O.   On: 08/28/2020 19:57    Procedures Procedures   Medications Ordered in ED Medications  lactated ringers infusion (has no administration in time range)    ED Course  I have reviewed the triage vital signs and the nursing notes.  Pertinent labs & imaging results that were available during my care of the patient were reviewed by me and considered in my medical decision making (see chart for details).    MDM Rules/Calculators/A&P                          Patient presents as outlined.  Diagnostic evaluation does not identify specific cause for episode of confusion and general weakness.  Patient is back to baseline.   Patient's wife is at bedside.  At this point he has been stable with stable vital signs.  Normotensive no fever.  Patient has mild leukocytosis but no sign of infectious focus.  Unclear what precipitated the event earlier today.  Patient did have a Nucynta tablet however he has had this medication for couple months.  He however does not typically take it 3 times a day.  At this time with clear mental status and no sign of acute illness, plan will be to return to normal activities and care with the patient's wife.  She is a good caregiver and will continue to observe for vital signs and mental status change.  Return precautions reviewed. Final Clinical Impression(s) / ED Diagnoses Final diagnoses:  Altered mental status, unspecified altered mental status type    Rx / DC Orders ED Discharge Orders    None       Charlesetta Shanks, MD 08/28/20 2210

## 2020-09-08 DIAGNOSIS — R7303 Prediabetes: Secondary | ICD-10-CM | POA: Diagnosis not present

## 2020-09-08 DIAGNOSIS — M109 Gout, unspecified: Secondary | ICD-10-CM | POA: Diagnosis not present

## 2020-09-08 DIAGNOSIS — I10 Essential (primary) hypertension: Secondary | ICD-10-CM | POA: Diagnosis not present

## 2020-09-08 DIAGNOSIS — E7800 Pure hypercholesterolemia, unspecified: Secondary | ICD-10-CM | POA: Diagnosis not present

## 2020-09-15 DIAGNOSIS — M109 Gout, unspecified: Secondary | ICD-10-CM | POA: Diagnosis not present

## 2020-09-15 DIAGNOSIS — E782 Mixed hyperlipidemia: Secondary | ICD-10-CM | POA: Diagnosis not present

## 2020-09-15 DIAGNOSIS — R413 Other amnesia: Secondary | ICD-10-CM | POA: Diagnosis not present

## 2020-09-15 DIAGNOSIS — I2510 Atherosclerotic heart disease of native coronary artery without angina pectoris: Secondary | ICD-10-CM | POA: Diagnosis not present

## 2020-09-15 DIAGNOSIS — I700 Atherosclerosis of aorta: Secondary | ICD-10-CM | POA: Diagnosis not present

## 2020-09-15 DIAGNOSIS — R7303 Prediabetes: Secondary | ICD-10-CM | POA: Diagnosis not present

## 2020-09-15 DIAGNOSIS — G4700 Insomnia, unspecified: Secondary | ICD-10-CM | POA: Diagnosis not present

## 2020-09-15 DIAGNOSIS — R269 Unspecified abnormalities of gait and mobility: Secondary | ICD-10-CM | POA: Diagnosis not present

## 2020-09-15 DIAGNOSIS — I10 Essential (primary) hypertension: Secondary | ICD-10-CM | POA: Diagnosis not present

## 2020-09-22 DIAGNOSIS — Z20822 Contact with and (suspected) exposure to covid-19: Secondary | ICD-10-CM | POA: Diagnosis not present

## 2020-10-11 DIAGNOSIS — G4733 Obstructive sleep apnea (adult) (pediatric): Secondary | ICD-10-CM | POA: Diagnosis not present

## 2020-10-11 DIAGNOSIS — I10 Essential (primary) hypertension: Secondary | ICD-10-CM | POA: Diagnosis not present

## 2020-10-18 ENCOUNTER — Encounter (HOSPITAL_COMMUNITY): Payer: Self-pay

## 2020-10-18 ENCOUNTER — Emergency Department (HOSPITAL_COMMUNITY): Payer: Medicare Other

## 2020-10-18 ENCOUNTER — Emergency Department (HOSPITAL_COMMUNITY)
Admission: EM | Admit: 2020-10-18 | Discharge: 2020-10-18 | Disposition: A | Discharge status: Home / Self Care | Payer: Medicare Other | Attending: Emergency Medicine | Admitting: Emergency Medicine

## 2020-10-18 ENCOUNTER — Other Ambulatory Visit: Payer: Self-pay

## 2020-10-18 ENCOUNTER — Emergency Department (HOSPITAL_BASED_OUTPATIENT_CLINIC_OR_DEPARTMENT_OTHER): Payer: Medicare Other

## 2020-10-18 DIAGNOSIS — R296 Repeated falls: Secondary | ICD-10-CM | POA: Insufficient documentation

## 2020-10-18 DIAGNOSIS — F0390 Unspecified dementia without behavioral disturbance: Secondary | ICD-10-CM | POA: Diagnosis not present

## 2020-10-18 DIAGNOSIS — Z96612 Presence of left artificial shoulder joint: Secondary | ICD-10-CM | POA: Insufficient documentation

## 2020-10-18 DIAGNOSIS — R531 Weakness: Secondary | ICD-10-CM | POA: Insufficient documentation

## 2020-10-18 DIAGNOSIS — Z8616 Personal history of COVID-19: Secondary | ICD-10-CM | POA: Diagnosis not present

## 2020-10-18 DIAGNOSIS — Z7982 Long term (current) use of aspirin: Secondary | ICD-10-CM | POA: Diagnosis not present

## 2020-10-18 DIAGNOSIS — I2510 Atherosclerotic heart disease of native coronary artery without angina pectoris: Secondary | ICD-10-CM | POA: Insufficient documentation

## 2020-10-18 DIAGNOSIS — Y92009 Unspecified place in unspecified non-institutional (private) residence as the place of occurrence of the external cause: Secondary | ICD-10-CM

## 2020-10-18 DIAGNOSIS — Z955 Presence of coronary angioplasty implant and graft: Secondary | ICD-10-CM | POA: Diagnosis not present

## 2020-10-18 DIAGNOSIS — R0902 Hypoxemia: Secondary | ICD-10-CM | POA: Diagnosis not present

## 2020-10-18 DIAGNOSIS — R600 Localized edema: Secondary | ICD-10-CM | POA: Diagnosis not present

## 2020-10-18 DIAGNOSIS — R6889 Other general symptoms and signs: Secondary | ICD-10-CM | POA: Diagnosis not present

## 2020-10-18 DIAGNOSIS — R410 Disorientation, unspecified: Secondary | ICD-10-CM | POA: Diagnosis not present

## 2020-10-18 DIAGNOSIS — I10 Essential (primary) hypertension: Secondary | ICD-10-CM | POA: Insufficient documentation

## 2020-10-18 DIAGNOSIS — Y92015 Private garage of single-family (private) house as the place of occurrence of the external cause: Secondary | ICD-10-CM | POA: Diagnosis not present

## 2020-10-18 DIAGNOSIS — M7989 Other specified soft tissue disorders: Secondary | ICD-10-CM | POA: Insufficient documentation

## 2020-10-18 DIAGNOSIS — Z743 Need for continuous supervision: Secondary | ICD-10-CM | POA: Diagnosis not present

## 2020-10-18 DIAGNOSIS — W19XXXA Unspecified fall, initial encounter: Secondary | ICD-10-CM | POA: Insufficient documentation

## 2020-10-18 DIAGNOSIS — R609 Edema, unspecified: Secondary | ICD-10-CM

## 2020-10-18 DIAGNOSIS — Z87891 Personal history of nicotine dependence: Secondary | ICD-10-CM | POA: Diagnosis not present

## 2020-10-18 LAB — CBC WITH DIFFERENTIAL/PLATELET
Abs Immature Granulocytes: 0.03 10*3/uL (ref 0.00–0.07)
Basophils Absolute: 0 10*3/uL (ref 0.0–0.1)
Basophils Relative: 0 %
Eosinophils Absolute: 0.2 10*3/uL (ref 0.0–0.5)
Eosinophils Relative: 3 %
HCT: 40 % (ref 39.0–52.0)
Hemoglobin: 13.6 g/dL (ref 13.0–17.0)
Immature Granulocytes: 0 %
Lymphocytes Relative: 20 %
Lymphs Abs: 1.5 10*3/uL (ref 0.7–4.0)
MCH: 30.2 pg (ref 26.0–34.0)
MCHC: 34 g/dL (ref 30.0–36.0)
MCV: 88.9 fL (ref 80.0–100.0)
Monocytes Absolute: 0.7 10*3/uL (ref 0.1–1.0)
Monocytes Relative: 9 %
Neutro Abs: 5.1 10*3/uL (ref 1.7–7.7)
Neutrophils Relative %: 68 %
Platelets: 235 10*3/uL (ref 150–400)
RBC: 4.5 MIL/uL (ref 4.22–5.81)
RDW: 12.9 % (ref 11.5–15.5)
WBC: 7.6 10*3/uL (ref 4.0–10.5)
nRBC: 0 % (ref 0.0–0.2)

## 2020-10-18 LAB — TROPONIN I (HIGH SENSITIVITY): Troponin I (High Sensitivity): 5 ng/L (ref <18)

## 2020-10-18 LAB — BRAIN NATRIURETIC PEPTIDE: B Natriuretic Peptide: 59.9 pg/mL (ref 0.0–100.0)

## 2020-10-18 LAB — COMPREHENSIVE METABOLIC PANEL
ALT: 11 U/L (ref 0–44)
AST: 19 U/L (ref 15–41)
Albumin: 3.6 g/dL (ref 3.5–5.0)
Alkaline Phosphatase: 76 U/L (ref 38–126)
Anion gap: 6 (ref 5–15)
BUN: 20 mg/dL (ref 8–23)
CO2: 28 mmol/L (ref 22–32)
Calcium: 8.6 mg/dL — ABNORMAL LOW (ref 8.9–10.3)
Chloride: 101 mmol/L (ref 98–111)
Creatinine, Ser: 1.08 mg/dL (ref 0.61–1.24)
GFR, Estimated: >60 mL/min (ref >60)
Glucose, Bld: 98 mg/dL (ref 70–99)
Potassium: 4.6 mmol/L (ref 3.5–5.1)
Sodium: 135 mmol/L (ref 135–145)
Total Bilirubin: 0.6 mg/dL (ref 0.3–1.2)
Total Protein: 6.3 g/dL — ABNORMAL LOW (ref 6.5–8.1)

## 2020-10-18 LAB — URINALYSIS, ROUTINE W REFLEX MICROSCOPIC
Bilirubin Urine: NEGATIVE
Glucose, UA: NEGATIVE mg/dL
Hgb urine dipstick: NEGATIVE
Ketones, ur: NEGATIVE mg/dL
Leukocytes,Ua: NEGATIVE
Nitrite: NEGATIVE
Protein, ur: NEGATIVE mg/dL
Specific Gravity, Urine: 1.015 (ref 1.005–1.030)
pH: 7.5 (ref 5.0–8.0)

## 2020-10-18 LAB — CBG MONITORING, ED: Glucose-Capillary: 92 mg/dL (ref 70–99)

## 2020-10-18 NOTE — Discharge Instructions (Addendum)
Follow up with your primary care provider. Your symptoms could be due to your medications, specifically if you are still taking the Nucynta.  Please speak to your primary care provider about this. He did not have any evidence of a DVT in your left leg today.  Please keep your legs elevated to help with swelling. Return to the ER if you start to experience worsening swelling, chest pain, shortness of breath, loss of consciousness, blurry vision, numbness in arms or legs

## 2020-10-18 NOTE — ED Triage Notes (Signed)
Pt BIB [facility]. Pt had third fall today at home in the garage. Wife has noticed increased weakness in pt over the past couple of days. Pt has hx of dementia. EMS placed 20g in left AC. Pt received 157mL of normal saline in route.   Vitals were  90/52 27-RR 76-HR 94% on RA  118- CBG

## 2020-10-18 NOTE — Progress Notes (Signed)
LLE Venous duplex has been completed.  Preliminary results given to Vladimir Creeks, RN.  Results can be found under chart review under CV PROC. 10/18/2020 8:17 PM Ival Pacer RVT, RDMS

## 2020-10-18 NOTE — ED Provider Notes (Signed)
[facility] DEPT Provider Note   CSN: [id number] Arrival date & time: 10/18/20  1500     History Chief Complaint  Patient presents with   Benjamin George is a 80 y.o. male with a past medical history of A. fib, hypertension, hyperlipidemia presenting to the ED with a chief complaint of generalized weakness and frequent falls.  History is provided by wife at the bedside.  States that for the past 3 days he has had falls on 3 different occasions.  The last one was this morning.  Wife noticed when he woke up this morning that he was "more confused than usual, even though he has dementia."  Noticed that he mention that he was too weak to ambulate despite using his walker.  He did not have a head injury today but did have 2 head injuries in the past 2 days with his 2 falls.  No anticoagulant use.  No complaints of chest pain.  Wife denies any loss of consciousness that she is aware of.  She has noticed over the past few weeks that both of his legs have been swollen and it is unsure if it is because he lets them dangle off the bed when he sleeps.  He was given a diuretic to take but unsure if he is taking it.  Denies any fever, vomiting, diarrhea, cough or shortness of breath.  HPI     Past Medical History:  Diagnosis Date   Arthritis    Atrial fibrillation ([place])    Cardiac arrest ([place])    Complication of anesthesia    Gout    Gout    Heart attack ([place])    Heart disease    HLD (hyperlipidemia)    HTN (hypertension)    Memory disorder 12/21/2015   PONV (postoperative nausea and vomiting)    Sleep apnea    NPSG 10/26/07 - AHI 16.4 used5 yrs, uses CPAP nightly    Patient Active Problem List   Diagnosis Date Noted   AMS (altered mental status) 06/18/2020   Altered mental status 06/17/2020   Pneumonia due to COVID-19 virus 05/08/2020   Encephalopathy due to COVID-19 virus 05/08/2020   Dementia ([place]) 05/08/2020   Decreased ambulation status  05/07/2020   Impaired ambulation 05/07/2020   Fever    Weakness generalized    Hypotension    CAP (community acquired pneumonia) 05/13/2018   Community acquired pneumonia of left lower lobe of lung 05/12/2018   Ischemic cardiomyopathy 04/18/2017   Memory disorder 12/21/2015   Spinal stenosis 05/06/2013   Palpitations 03/02/2013   Morbid obesity ([place]) 12/12/2011   Arthritis of shoulder region, 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   CAD (coronary artery disease) 10/24/2010   Preoperative evaluation to rule out surgical contraindication 10/24/2010   Seasonal and perennial allergic rhinitis 05/17/2010   INSOMNIA 05/17/2010   Hyperlipidemia LDL goal <70 04/13/2009   GOUT 04/13/2009   Obstructive sleep apnea 04/13/2009   Essential hypertension 04/13/2009   HEART ATTACK 04/13/2009   CARDIAC ARREST 04/13/2009    Past Surgical History:  Procedure Laterality Date   angioplasty     APPENDECTOMY     CAROTID STENT     CARPECTOMY Left 07/22/2017   Procedure: LEFT WRIST HARDWARE REMOVAL AND PROXIMAL ROW CARPECTOMY;  Surgeon: Milly Jakob, MD;  Location: [facility];  Service: Orthopedics;  Laterality: Left;   CORONARY ANGIOPLASTY WITH STENT PLACEMENT  06/27/2007   L main OK, LAD mild irreg, CFX stent OK, irreg,  RCA 60% calcified, EF 45%    HAND SURGERY     left    HARDWARE REMOVAL Left 07/22/2017   Procedure: HARDWARE REMOVAL;  Surgeon: Milly Jakob, MD;  Location: [facility];  Service: Orthopedics;  Laterality: Left;   LUMBAR LAMINECTOMY/DECOMPRESSION MICRODISCECTOMY N/A 05/06/2013   Procedure: Lumbar 4-5 decompression    1 LEVEL;  Surgeon: Sinclair Ship, MD;  Location: [facility];  Service: Orthopedics;  Laterality: N/A;  Lumbar 4-5 decompression   nasal septopalsty     ORIF SCAPHOID FRACTURE Left 01/21/2017   Procedure: OPEN TREATMENT OF LEFT SCAPHOID FRACTURE;  Surgeon: Milly Jakob, MD;  Location: [facility];  Service: Orthopedics;  Laterality: Left;    TOTAL SHOULDER ARTHROPLASTY  04/17/2011   Procedure: TOTAL SHOULDER ARTHROPLASTY; left Surgeon: Nita Sells, MD;  Location: [facility];  Service: Orthopedics;  Laterality: Left;       Family History  Problem Relation Age of Onset   Heart attack Father    Heart disease Father    Hyperlipidemia Father    Other Father        Guillain Barre   Alzheimer's disease Mother    Colon cancer Neg Hx    Esophageal cancer Neg Hx    Rectal cancer Neg Hx    Stomach cancer Neg Hx     Social History   Tobacco Use   Smoking status: Former    Packs/day: 1.50    Years: 30.00    Pack years: 45.00    Types: Cigarettes    Quit date: 04/28/1987    Years since quitting: 33.4   Smokeless tobacco: Never   Tobacco comments:    smoked 2 ppd for 32 years; quit in 1989  Vaping Use   Vaping Use: Never used  Substance Use Topics   Alcohol use: Yes    Alcohol/week: 16.0 standard drinks    Types: 14 Cans of beer, 2 Shots of liquor per week    Comment: [place] of beers per day   Drug use: No    Home Medications Prior to Admission medications   Medication Sig Start Date End Date Taking? Authorizing Provider  aspirin 81 MG tablet Take 81 mg by mouth daily.    [provider]  atorvastatin (LIPITOR) 20 MG tablet TAKE 1 TABLET BY MOUTH EVERY DAY 02/20/17   Larey Dresser, MD  linaclotide [facility]) 145 MCG CAPS capsule Take 145 mcg by mouth in the morning and at bedtime.    [provider]  memantine (NAMENDA) 10 MG tablet TAKE 1 TABLET BY MOUTH TWICE A DAY 08/15/20   Ward Givens, NP  modafinil (PROVIGIL) 100 MG tablet Take 100 mg by mouth daily.    [provider]  Multiple Vitamin (MULTIVITAMIN) tablet Take 1 tablet by mouth every morning.    [provider]  NUCYNTA 50 MG tablet Take 50 mg by mouth in the morning, at noon, and at bedtime. 02/08/20   [provider]  polyethylene glycol (MIRALAX / GLYCOLAX) 17 g packet Take 17 g by mouth daily as needed  for mild constipation. Uses it every other day- depending on bowel movements.    [provider]    Allergies    Codeine, Hydrocodone, Aricept [donepezil hcl], Morphine sulfate, Oxycodone hcl, Pregabalin, Methocarbamol, and Other  Review of Systems   Review of Systems  Unable to perform ROS: Dementia  Cardiovascular:  Positive for leg swelling. Negative for chest pain.  Gastrointestinal:  Negative for abdominal  pain.  Neurological:  Negative for headaches.   Physical Exam Updated Vital Signs BP (!) 155/132   Pulse 80   Temp 97.8 F (36.6 C) (Oral)   Resp 16   Ht 6' (1.829 m)   Wt 94.4 kg   SpO2 92%   BMI 28.23 kg/m   Physical Exam Vitals and nursing note reviewed.  Constitutional:      General: He is not in acute distress.    Appearance: He is well-developed.  HENT:     Head: Normocephalic and atraumatic.     Nose: Nose normal.  Eyes:     General: No scleral icterus.       Right eye: No discharge.        Left eye: No discharge.     Conjunctiva/sclera: Conjunctivae normal.  Cardiovascular:     Rate and Rhythm: Normal rate and regular rhythm.     Heart sounds: Normal heart sounds. No murmur heard.   No friction rub. No gallop.  Pulmonary:     Effort: Pulmonary effort is normal. No respiratory distress.     Breath sounds: Normal breath sounds.  Abdominal:     General: Bowel sounds are normal. There is no distension.     Palpations: Abdomen is soft.     Tenderness: There is no abdominal tenderness. There is no guarding.     Comments: Abdomen is soft.  Musculoskeletal:        General: Normal range of motion.     Cervical back: Normal range of motion and neck supple.     Right lower leg: Edema present.     Left lower leg: Edema present.     Comments: Bilateral lower extremity edema, left greater than right.  2+ DP pulse palpated bilaterally.  Skin:    General: Skin is warm and dry.     Findings: No rash.  Neurological:     Mental Status: He is alert.      Motor: No abnormal muscle tone.     Coordination: Coordination normal.     Comments: Oriented to self, place and wife.  Knows he is in [place] and it is the year 2022.  No facial asymmetry.  Strength 5/5 in bilateral upper and lower extremities.    ED Results / Procedures / Treatments   Labs (all labs ordered are listed, but only abnormal results are displayed) Labs Reviewed  COMPREHENSIVE METABOLIC PANEL - Abnormal; Notable for the following components:      Result Value   Calcium 8.6 (*)    Total Protein 6.3 (*)    All other components within normal limits  URINE CULTURE  CBC WITH DIFFERENTIAL/PLATELET  URINALYSIS, ROUTINE W REFLEX MICROSCOPIC  BRAIN NATRIURETIC PEPTIDE  CBG MONITORING, ED  TROPONIN I (HIGH SENSITIVITY)  TROPONIN I (HIGH SENSITIVITY)    EKG EKG Interpretation  Date/Time:  Tuesday October 18 2020 16:46:09 EDT Ventricular Rate:  76 PR Interval:  318 QRS Duration: 109 QT Interval:  393 QTC Calculation: 442 R Axis:   53 Text Interpretation: Sinus rhythm Prolonged PR interval Nonspecific T abnormalities, inferior leads: worsened T wave inversions in III, aVF compared to prior 06/17/20 Confirmed by Lorre Munroe ([id number]) on 10/18/2020 7:33:32 PM  Radiology DG Chest 2 View  Result Date: 10/18/2020 CLINICAL DATA:  Weakness and leg swelling following fall, initial encounter EXAM: CHEST - 2 VIEW COMPARISON:  08/28/2020 FINDINGS: Cardiac shadow is within normal limits. Aortic calcifications are noted. Lungs are well aerated bilaterally. No focal infiltrate or sizable  effusion is noted. Spinal stimulator is seen as well as left shoulder replacement. IMPRESSION: No acute abnormality noted. Electronically Signed   By: Inez Catalina M.D.   On: 10/18/2020 19:20   VAS [place] LOWER EXTREMITY VENOUS (DVT) (ONLY [facility] & [facility] 7a-7p)  Result Date: 10/18/2020  Lower Venous DVT Study Patient Name:  Benjamin George  Date of Exam:   10/18/2020 Medical Rec #: [id number]         Accession #:     [id number] Date of Birth: Feb 29, 1941         Patient Gender: M Patient Age:   80Y Exam Location:  [facility] Procedure:      VAS [place] LOWER EXTREMITY VENOUS (DVT) Referring Phys: [id number] Benjamin George --------------------------------------------------------------------------------  Indications: Edema.  Comparison Study: No previous exams Performing Technologist: Jody Hill RVT, RDMS  Examination Guidelines: A complete evaluation includes B-mode imaging, spectral Doppler, color Doppler, and power Doppler as needed of all accessible portions of each vessel. Bilateral testing is considered an integral part of a complete examination. Limited examinations for reoccurring indications may be performed as noted. The reflux portion of the exam is performed with the patient in reverse Trendelenburg.  +-----+---------------+---------+-----------+----------+--------------+ RIGHTCompressibilityPhasicitySpontaneityPropertiesThrombus Aging +-----+---------------+---------+-----------+----------+--------------+ CFV  Full           Yes      Yes                                 +-----+---------------+---------+-----------+----------+--------------+   +---------+---------------+---------+-----------+----------+--------------+ LEFT     CompressibilityPhasicitySpontaneityPropertiesThrombus Aging +---------+---------------+---------+-----------+----------+--------------+ CFV      Full           Yes      Yes                                 +---------+---------------+---------+-----------+----------+--------------+ SFJ      Full                                                        +---------+---------------+---------+-----------+----------+--------------+ FV Prox  Full           Yes      Yes                                 +---------+---------------+---------+-----------+----------+--------------+ FV Mid   Full           Yes      Yes                                  +---------+---------------+---------+-----------+----------+--------------+ FV DistalFull           Yes      Yes                                 +---------+---------------+---------+-----------+----------+--------------+ PFV      Full                                                        +---------+---------------+---------+-----------+----------+--------------+  POP      Full           Yes      Yes                                 +---------+---------------+---------+-----------+----------+--------------+ PTV      Full                                                        +---------+---------------+---------+-----------+----------+--------------+ PERO     Full                                                        +---------+---------------+---------+-----------+----------+--------------+    Summary: RIGHT: - No evidence of common femoral vein obstruction.  LEFT: - There is no evidence of deep vein thrombosis in the lower extremity. - There is no evidence of superficial venous thrombosis.  - No cystic structure found in the popliteal fossa.  *See table(s) above for measurements and observations.    Preliminary     Procedures Procedures   Medications Ordered in ED Medications - No data to display  ED Course  I have reviewed the triage vital signs and the nursing notes.  Pertinent labs & imaging results that were available during my care of the patient were reviewed by me and considered in my medical decision making (see chart for details).  Clinical Course as of 10/18/20 2231  Tue Oct 18, 2020  1639 Glucose-Capillary: 92 [HK]  1650 WBC: 7.6 [HK]  1650 Hemoglobin: 13.6 [HK]  1857 Creatinine: 1.08 [HK]  1857 Sodium: 135 [HK]  1857 Potassium: 4.6 [HK]  1857 Leukocytes,Ua: NEGATIVE [HK]  1858 Nitrite: NEGATIVE [HK]  1858 B Natriuretic Peptide: 59.9 [HK]  2136 Troponin I (High Sensitivity): 5 [HK]    Clinical Course User Index [HK] Delia Heady, PA-C   MDM  Rules/Calculators/A&P                          80 year old male presenting to the ED for 3 falls in the past 3 days.  Wife noticed increased generalized weakness since this morning as well as increased confusion.  Had a fall today but denies any head injury or loss of consciousness.  Patient denies any complaints of chest pain, abdominal pain, vomiting, headache.  Complaining of back pain which wife states is chronic for him.  She is concerned about his bilateral lower extremity edema that she has noticed over the past few days. He is alert, able to follow commands, oriented to his baseline.  He is afebrile.  He has bilateral lower extremity edema, left greater than right.  Equal intact distal pulses bilaterally.  No signs of numbness or weakness on exam.  Will obtain lab work, imaging, EKG and reassess.  DVT study is negative in the left lower extremity.  Chest x-ray without any acute findings.  EKG shows sinus rhythm with slightly worsening T wave inversions in lead III and aVF compared to prior.  Because of this troponin was obtained which is negative x1.  CMP, CBC unremarkable.  Urinalysis  is negative.  BMP is normal here. Symptoms could be due to worsening dementia versus polypharmacy.  No evidence of infectious cause here.  No neurological deficits that would concern me for stroke.  His vital signs have been within normal limits here.  I feel that because his symptoms have been going on for 3 days and we have had a negative troponin here I feel that we can rule out ACS.  Patient and wife are agreeable to the plan.  He has a follow-up appointment with his PCP tomorrow and encouraged them to keep this for follow-up.  Return precautions given.    Patient is hemodynamically stable, in NAD, and able to ambulate in the ED. Evaluation does not show pathology that would require ongoing emergent intervention or inpatient treatment. I explained the diagnosis to the patient. Pain has been managed and has no  complaints prior to discharge. Patient is comfortable with above plan and is stable for discharge at this time. All questions were answered prior to disposition. Strict return precautions for returning to the ED were discussed. Encouraged follow up with PCP.   An After Visit Summary was printed and given to the patient.   Portions of this note were generated with [employer]. Dictation errors may occur despite best attempts at proofreading.  Final Clinical Impression(s) / ED Diagnoses Final diagnoses:  None    Rx / DC Orders ED Discharge Orders     None        Delia Heady, PA-C 10/18/20 2257    Arnaldo Natal, MD 10/18/20 [phone]

## 2020-10-19 DIAGNOSIS — R5381 Other malaise: Secondary | ICD-10-CM | POA: Diagnosis not present

## 2020-10-19 DIAGNOSIS — R600 Localized edema: Secondary | ICD-10-CM | POA: Diagnosis not present

## 2020-10-19 DIAGNOSIS — I959 Hypotension, unspecified: Secondary | ICD-10-CM | POA: Diagnosis not present

## 2020-10-19 DIAGNOSIS — R296 Repeated falls: Secondary | ICD-10-CM | POA: Diagnosis not present

## 2020-10-20 LAB — URINE CULTURE

## 2020-10-21 ENCOUNTER — Telehealth: Payer: Self-pay

## 2020-10-21 NOTE — Telephone Encounter (Signed)
10:30 am- SW attempted to call patient/his wife, to schedule initial palliative care visit. SW tried the home and mobile number. SW left a message on the mobile number, requesting a call back to schedule visit.

## 2020-10-27 ENCOUNTER — Ambulatory Visit: Payer: Medicare Other | Admitting: Adult Health

## 2020-11-03 ENCOUNTER — Other Ambulatory Visit: Payer: Self-pay

## 2020-11-03 ENCOUNTER — Inpatient Hospital Stay (HOSPITAL_COMMUNITY)
Admission: EM | Admit: 2020-11-03 | Discharge: 2020-11-07 | DRG: 064 | Disposition: E | Payer: Medicare Other | Attending: Neurology | Admitting: Neurology

## 2020-11-03 ENCOUNTER — Emergency Department (HOSPITAL_COMMUNITY): Payer: Medicare Other

## 2020-11-03 ENCOUNTER — Inpatient Hospital Stay (HOSPITAL_COMMUNITY): Payer: Medicare Other

## 2020-11-03 ENCOUNTER — Encounter (HOSPITAL_COMMUNITY): Payer: Self-pay | Admitting: Neurology

## 2020-11-03 DIAGNOSIS — Z66 Do not resuscitate: Secondary | ICD-10-CM | POA: Diagnosis not present

## 2020-11-03 DIAGNOSIS — R29727 NIHSS score 27: Secondary | ICD-10-CM | POA: Diagnosis present

## 2020-11-03 DIAGNOSIS — Z7982 Long term (current) use of aspirin: Secondary | ICD-10-CM

## 2020-11-03 DIAGNOSIS — I10 Essential (primary) hypertension: Secondary | ICD-10-CM | POA: Diagnosis not present

## 2020-11-03 DIAGNOSIS — I255 Ischemic cardiomyopathy: Secondary | ICD-10-CM | POA: Diagnosis present

## 2020-11-03 DIAGNOSIS — I639 Cerebral infarction, unspecified: Secondary | ICD-10-CM | POA: Diagnosis not present

## 2020-11-03 DIAGNOSIS — I252 Old myocardial infarction: Secondary | ICD-10-CM

## 2020-11-03 DIAGNOSIS — G510 Bell's palsy: Secondary | ICD-10-CM | POA: Diagnosis present

## 2020-11-03 DIAGNOSIS — G936 Cerebral edema: Secondary | ICD-10-CM | POA: Diagnosis present

## 2020-11-03 DIAGNOSIS — R6889 Other general symptoms and signs: Secondary | ICD-10-CM | POA: Diagnosis not present

## 2020-11-03 DIAGNOSIS — G8194 Hemiplegia, unspecified affecting left nondominant side: Secondary | ICD-10-CM | POA: Diagnosis present

## 2020-11-03 DIAGNOSIS — G4730 Sleep apnea, unspecified: Secondary | ICD-10-CM | POA: Diagnosis present

## 2020-11-03 DIAGNOSIS — Z820 Family history of epilepsy and other diseases of the nervous system: Secondary | ICD-10-CM

## 2020-11-03 DIAGNOSIS — R29818 Other symptoms and signs involving the nervous system: Secondary | ICD-10-CM | POA: Diagnosis not present

## 2020-11-03 DIAGNOSIS — I609 Nontraumatic subarachnoid hemorrhage, unspecified: Secondary | ICD-10-CM | POA: Diagnosis present

## 2020-11-03 DIAGNOSIS — Z8249 Family history of ischemic heart disease and other diseases of the circulatory system: Secondary | ICD-10-CM | POA: Diagnosis not present

## 2020-11-03 DIAGNOSIS — F0390 Unspecified dementia without behavioral disturbance: Secondary | ICD-10-CM | POA: Diagnosis present

## 2020-11-03 DIAGNOSIS — S066X0A Traumatic subarachnoid hemorrhage without loss of consciousness, initial encounter: Secondary | ICD-10-CM | POA: Diagnosis not present

## 2020-11-03 DIAGNOSIS — M109 Gout, unspecified: Secondary | ICD-10-CM | POA: Diagnosis not present

## 2020-11-03 DIAGNOSIS — I4891 Unspecified atrial fibrillation: Secondary | ICD-10-CM | POA: Diagnosis present

## 2020-11-03 DIAGNOSIS — R29810 Facial weakness: Secondary | ICD-10-CM | POA: Diagnosis not present

## 2020-11-03 DIAGNOSIS — Z888 Allergy status to other drugs, medicaments and biological substances status: Secondary | ICD-10-CM

## 2020-11-03 DIAGNOSIS — E785 Hyperlipidemia, unspecified: Secondary | ICD-10-CM | POA: Diagnosis present

## 2020-11-03 DIAGNOSIS — Z87891 Personal history of nicotine dependence: Secondary | ICD-10-CM

## 2020-11-03 DIAGNOSIS — R296 Repeated falls: Secondary | ICD-10-CM | POA: Diagnosis present

## 2020-11-03 DIAGNOSIS — I619 Nontraumatic intracerebral hemorrhage, unspecified: Principal | ICD-10-CM

## 2020-11-03 DIAGNOSIS — Z79899 Other long term (current) drug therapy: Secondary | ICD-10-CM

## 2020-11-03 DIAGNOSIS — Z8616 Personal history of COVID-19: Secondary | ICD-10-CM

## 2020-11-03 DIAGNOSIS — Z885 Allergy status to narcotic agent status: Secondary | ICD-10-CM

## 2020-11-03 DIAGNOSIS — G935 Compression of brain: Secondary | ICD-10-CM | POA: Diagnosis present

## 2020-11-03 DIAGNOSIS — J9601 Acute respiratory failure with hypoxia: Secondary | ICD-10-CM | POA: Diagnosis not present

## 2020-11-03 DIAGNOSIS — I610 Nontraumatic intracerebral hemorrhage in hemisphere, subcortical: Secondary | ICD-10-CM | POA: Diagnosis not present

## 2020-11-03 DIAGNOSIS — Z881 Allergy status to other antibiotic agents status: Secondary | ICD-10-CM

## 2020-11-03 DIAGNOSIS — K219 Gastro-esophageal reflux disease without esophagitis: Secondary | ICD-10-CM | POA: Diagnosis present

## 2020-11-03 DIAGNOSIS — Z6828 Body mass index (BMI) 28.0-28.9, adult: Secondary | ICD-10-CM

## 2020-11-03 DIAGNOSIS — M1990 Unspecified osteoarthritis, unspecified site: Secondary | ICD-10-CM | POA: Diagnosis present

## 2020-11-03 DIAGNOSIS — Z7189 Other specified counseling: Secondary | ICD-10-CM | POA: Diagnosis not present

## 2020-11-03 DIAGNOSIS — Z743 Need for continuous supervision: Secondary | ICD-10-CM | POA: Diagnosis not present

## 2020-11-03 DIAGNOSIS — I615 Nontraumatic intracerebral hemorrhage, intraventricular: Secondary | ICD-10-CM | POA: Diagnosis not present

## 2020-11-03 DIAGNOSIS — R54 Age-related physical debility: Secondary | ICD-10-CM | POA: Diagnosis present

## 2020-11-03 DIAGNOSIS — Z515 Encounter for palliative care: Secondary | ICD-10-CM | POA: Diagnosis not present

## 2020-11-03 DIAGNOSIS — R4781 Slurred speech: Secondary | ICD-10-CM | POA: Diagnosis not present

## 2020-11-03 DIAGNOSIS — Z20822 Contact with and (suspected) exposure to covid-19: Secondary | ICD-10-CM | POA: Diagnosis not present

## 2020-11-03 DIAGNOSIS — Z83438 Family history of other disorder of lipoprotein metabolism and other lipidemia: Secondary | ICD-10-CM

## 2020-11-03 DIAGNOSIS — I499 Cardiac arrhythmia, unspecified: Secondary | ICD-10-CM | POA: Diagnosis not present

## 2020-11-03 DIAGNOSIS — Z8674 Personal history of sudden cardiac arrest: Secondary | ICD-10-CM

## 2020-11-03 DIAGNOSIS — I611 Nontraumatic intracerebral hemorrhage in hemisphere, cortical: Secondary | ICD-10-CM | POA: Diagnosis present

## 2020-11-03 DIAGNOSIS — J9600 Acute respiratory failure, unspecified whether with hypoxia or hypercapnia: Secondary | ICD-10-CM | POA: Diagnosis not present

## 2020-11-03 DIAGNOSIS — Z96612 Presence of left artificial shoulder joint: Secondary | ICD-10-CM | POA: Diagnosis present

## 2020-11-03 DIAGNOSIS — R471 Dysarthria and anarthria: Secondary | ICD-10-CM | POA: Diagnosis present

## 2020-11-03 DIAGNOSIS — G319 Degenerative disease of nervous system, unspecified: Secondary | ICD-10-CM | POA: Diagnosis not present

## 2020-11-03 DIAGNOSIS — R414 Neurologic neglect syndrome: Secondary | ICD-10-CM | POA: Diagnosis not present

## 2020-11-03 DIAGNOSIS — I4430 Unspecified atrioventricular block: Secondary | ICD-10-CM | POA: Diagnosis not present

## 2020-11-03 DIAGNOSIS — I2510 Atherosclerotic heart disease of native coronary artery without angina pectoris: Secondary | ICD-10-CM | POA: Diagnosis present

## 2020-11-03 LAB — PROTIME-INR
INR: 1 (ref 0.8–1.2)
INR: 1.1 (ref 0.8–1.2)
Prothrombin Time: 13.3 seconds (ref 11.4–15.2)
Prothrombin Time: 13.7 seconds (ref 11.4–15.2)

## 2020-11-03 LAB — COMPREHENSIVE METABOLIC PANEL
ALT: 18 U/L (ref 0–44)
AST: 19 U/L (ref 15–41)
Albumin: 3.4 g/dL — ABNORMAL LOW (ref 3.5–5.0)
Alkaline Phosphatase: 79 U/L (ref 38–126)
Anion gap: 6 (ref 5–15)
BUN: 16 mg/dL (ref 8–23)
CO2: 26 mmol/L (ref 22–32)
Calcium: 8.8 mg/dL — ABNORMAL LOW (ref 8.9–10.3)
Chloride: 100 mmol/L (ref 98–111)
Creatinine, Ser: 0.98 mg/dL (ref 0.61–1.24)
GFR, Estimated: >60 mL/min (ref >60)
Glucose, Bld: 114 mg/dL — ABNORMAL HIGH (ref 70–99)
Potassium: 4.1 mmol/L (ref 3.5–5.1)
Sodium: 132 mmol/L — ABNORMAL LOW (ref 135–145)
Total Bilirubin: 0.7 mg/dL (ref 0.3–1.2)
Total Protein: 5.9 g/dL — ABNORMAL LOW (ref 6.5–8.1)

## 2020-11-03 LAB — CBC
HCT: 41.6 % (ref 39.0–52.0)
Hemoglobin: 14.3 g/dL (ref 13.0–17.0)
MCH: 30.4 pg (ref 26.0–34.0)
MCHC: 34.4 g/dL (ref 30.0–36.0)
MCV: 88.5 fL (ref 80.0–100.0)
Platelets: 272 10*3/uL (ref 150–400)
RBC: 4.7 MIL/uL (ref 4.22–5.81)
RDW: 12.9 % (ref 11.5–15.5)
WBC: 9.8 10*3/uL (ref 4.0–10.5)
nRBC: 0 % (ref 0.0–0.2)

## 2020-11-03 LAB — LIPID PANEL
Cholesterol: 154 mg/dL (ref 0–200)
HDL: 59 mg/dL (ref >40)
LDL Cholesterol: 88 mg/dL (ref 0–99)
Total CHOL/HDL Ratio: 2.6 RATIO
Triglycerides: 37 mg/dL (ref <150)
VLDL: 7 mg/dL (ref 0–40)

## 2020-11-03 LAB — APTT
aPTT: 28 seconds (ref 24–36)
aPTT: 25 seconds (ref 24–36)

## 2020-11-03 LAB — DIFFERENTIAL
Abs Immature Granulocytes: 0.04 10*3/uL (ref 0.00–0.07)
Basophils Absolute: 0.1 10*3/uL (ref 0.0–0.1)
Basophils Relative: 1 %
Eosinophils Absolute: 0.3 10*3/uL (ref 0.0–0.5)
Eosinophils Relative: 4 %
Immature Granulocytes: 0 %
Lymphocytes Relative: 25 %
Lymphs Abs: 2.5 10*3/uL (ref 0.7–4.0)
Monocytes Absolute: 0.8 10*3/uL (ref 0.1–1.0)
Monocytes Relative: 8 %
Neutro Abs: 6.1 10*3/uL (ref 1.7–7.7)
Neutrophils Relative %: 62 %

## 2020-11-03 LAB — I-STAT CHEM 8, ED
BUN: 17 mg/dL (ref 8–23)
Calcium, Ion: 1.07 mmol/L — ABNORMAL LOW (ref 1.15–1.40)
Chloride: 100 mmol/L (ref 98–111)
Creatinine, Ser: 1 mg/dL (ref 0.61–1.24)
Glucose, Bld: 113 mg/dL — ABNORMAL HIGH (ref 70–99)
HCT: 40 % (ref 39.0–52.0)
Hemoglobin: 13.6 g/dL (ref 13.0–17.0)
Potassium: 4.1 mmol/L (ref 3.5–5.1)
Sodium: 134 mmol/L — ABNORMAL LOW (ref 135–145)
TCO2: 26 mmol/L (ref 22–32)

## 2020-11-03 LAB — RESP PANEL BY RT-PCR (FLU A&B, COVID) ARPGX2
Influenza A by PCR: NEGATIVE
Influenza B by PCR: NEGATIVE
SARS Coronavirus 2 by RT PCR: NEGATIVE

## 2020-11-03 LAB — SODIUM
Sodium: 137 mmol/L (ref 135–145)
Sodium: 138 mmol/L (ref 135–145)

## 2020-11-03 LAB — CBG MONITORING, ED: Glucose-Capillary: 123 mg/dL — ABNORMAL HIGH (ref 70–99)

## 2020-11-03 MED ORDER — ONDANSETRON HCL 4 MG/2ML IJ SOLN
4.0000 mg | Freq: Four times a day (QID) | INTRAMUSCULAR | Status: DC | PRN
  Administered 2020-11-03: 4 mg via INTRAVENOUS
  Filled 2020-11-03: qty 2

## 2020-11-03 MED ORDER — PANTOPRAZOLE SODIUM 40 MG IV SOLR
40.0000 mg | Freq: Every day | INTRAVENOUS | Status: DC
  Administered 2020-11-03: 40 mg via INTRAVENOUS
  Filled 2020-11-03: qty 40

## 2020-11-03 MED ORDER — SODIUM CHLORIDE 3 % IV SOLN
INTRAVENOUS | Status: DC
  Filled 2020-11-03 (×3): qty 500

## 2020-11-03 MED ORDER — CLEVIDIPINE BUTYRATE 0.5 MG/ML IV EMUL: 0.0000 mg/h | INTRAVENOUS | Status: DC

## 2020-11-03 MED ORDER — SENNOSIDES-DOCUSATE SODIUM 8.6-50 MG PO TABS: 1.0000 | ORAL_TABLET | Freq: Two times a day (BID) | ORAL | Status: DC

## 2020-11-03 MED ORDER — ACETAMINOPHEN 160 MG/5ML PO SOLN
650.0000 mg | ORAL | Status: DC | PRN
Start: 2020-11-03 — End: 2020-11-04

## 2020-11-03 MED ORDER — STROKE: EARLY STAGES OF RECOVERY BOOK: Freq: Once | Status: DC

## 2020-11-03 MED ORDER — ACETAMINOPHEN 325 MG PO TABS: 650.0000 mg | ORAL_TABLET | ORAL | Status: DC | PRN

## 2020-11-03 MED ORDER — CLEVIDIPINE BUTYRATE 0.5 MG/ML IV EMUL
0.0000 mg/h | INTRAVENOUS | Status: DC
  Administered 2020-11-03: 5 mg/h via INTRAVENOUS
  Administered 2020-11-03: 2 mg/h via INTRAVENOUS
  Administered 2020-11-03 (×2): 5 mg/h via INTRAVENOUS
  Administered 2020-11-04: 10 mg/h via INTRAVENOUS
  Administered 2020-11-04: 12 mg/h via INTRAVENOUS
  Filled 2020-11-03 (×3): qty 50
  Filled 2020-11-03: qty 100
  Filled 2020-11-03: qty 50

## 2020-11-03 MED ORDER — SODIUM CHLORIDE 0.9% FLUSH: 3.0000 mL | Freq: Once | INTRAVENOUS | Status: DC

## 2020-11-03 MED ORDER — ACETAMINOPHEN 650 MG RE SUPP: 650.0000 mg | RECTAL | Status: DC | PRN

## 2020-11-03 NOTE — ED Triage Notes (Signed)
Pt arrived via EMS from home w/ c/o CODE STROKE LKW 0300. S/S R gaze, L-side weakness, L facial droop. Pt had dementia at baseline w/ delayed responses, but per report more delayed and increased AMS today. EMS IV 18g R AC, Pt 91% RA w/ EMS, placed on 3L w/ O2 then 94%. Otherwise VSS. CBG 112.

## 2020-11-03 NOTE — ED Notes (Signed)
Spoke with provider reference vomiting episode advised he would order Zofran

## 2020-11-03 NOTE — ED Provider Notes (Signed)
ATTENDING SUPERVISORY NOTE I have personally viewed the imaging studies performed. I have personally seen and examined the patient, and discussed the plan of care with the resident.  I have reviewed the documentation of the resident and agree.  No diagnosis found.  .Critical Care  Date/Time: 11/02/2020 8:17 AM Performed by: Elnora Morrison, MD Authorized by: Elnora Morrison, MD   Critical care provider statement:    Critical care time (minutes):  35   Critical care start time:  10/14/2020 7:40 AM   Critical care end time:  10/11/2020 8:15 AM   Critical care time was exclusive of:  Separately billable procedures and treating other patients and teaching time   Critical care was necessary to treat or prevent imminent or life-threatening deterioration of the following conditions:  CNS failure or compromise   Critical care was time spent personally by me on the following activities:  Discussions with consultants, evaluation of patient's response to treatment, examination of patient, ordering and performing treatments and interventions, ordering and review of laboratory studies, ordering and review of radiographic studies, pulse oximetry, re-evaluation of patient's condition, obtaining history from patient or surrogate and review of old charts    Elnora Morrison, MD 10/14/2020 1721

## 2020-11-03 NOTE — ED Notes (Signed)
Patient transported to CT 

## 2020-11-03 NOTE — ED Notes (Signed)
RN called RT per Dr Charna Archer request, RT to see pt and evaluate O2 needs

## 2020-11-03 NOTE — Code Documentation (Signed)
Stroke Response Nurse Documentation Code Documentation  Benjamin George is a 80 y.o. male arriving to [facility] ED via [facility] on 11/06/2020 with past medical hx of Dementia, and CVA. Code stroke was activated by EMS. Patient from home where he was LKW at 0300 and now complaining of Right Gaze, Left weakness and L facial droop. On No antithrombotic. Stroke team at the bedside on patient arrival. Labs drawn and patient cleared for CT by EDP. Patient to CT with team. NIHSS 27, see documentation for details and code stroke times.  Patient with disoriented, not following commands, right gaze preference , left hemianopia, left facial droop, left arm weakness, left leg weakness, left decreased sensation, dysarthria , and left neglect on exam. The following imaging was completed:  CT, CTA head and neck, CTP. Patient is not a candidate for tPA due to [place] Care/Plan. Bedside handoff with ED RN Benjamin George.  Cleviprex gtt to maintain SBP <140.  Neuro checks and VS q 1 hr    Benjamin George  Stroke Response RN

## 2020-11-03 NOTE — Plan of Care (Signed)
ON CALL NOTE  CTH completed and reviewed.  Redemonstrates large right frontoparietal parenchymal hematoma with moderate edema and 6 mm of left midline shift-not significantly changed from the prior CT head from 733 this morning.  Increased small volume intraventricular hemorrhage seen.  Similar small volume subarachnoid hemorrhage seen.  Received a call from the RN-reports bowel movement and while trying to change the patient, had an episode of bloody vomiting.  Recommended continuing monitoring and Zofran ordered 4 mg every 6 hours for nausea and vomiting.    Amie Portland, MD Neurologist [facility] Pager: [phone]

## 2020-11-03 NOTE — ED Notes (Signed)
Received verbal report from Gloria G RN at this time 

## 2020-11-03 NOTE — ED Notes (Signed)
Pt returned from ct scan at this time.

## 2020-11-03 NOTE — Evaluation (Signed)
Clinical/Bedside Swallow Evaluation Patient Details  Name: Benjamin George MRN: [id number] Date of Birth: 12/31/1940  Today's Date: 10/09/2020 Time: SLP Start Time (ACUTE ONLY): 50 SLP Stop Time (ACUTE ONLY): 1550 SLP Time Calculation (min) (ACUTE ONLY): 35 min  Past Medical History:  Past Medical History:  Diagnosis Date   Arthritis    Atrial fibrillation ([place])    Cardiac arrest ([place])    Complication of anesthesia    Gout    Gout    Heart attack ([place])    Heart disease    HLD (hyperlipidemia)    HTN (hypertension)    Memory disorder 12/21/2015   PONV (postoperative nausea and vomiting)    Sleep apnea    NPSG 10/26/07 - AHI 16.4 used5 yrs, uses CPAP nightly   Past Surgical History:  Past Surgical History:  Procedure Laterality Date   angioplasty     APPENDECTOMY     CAROTID STENT     CARPECTOMY Left 07/22/2017   Procedure: LEFT WRIST HARDWARE REMOVAL AND PROXIMAL ROW CARPECTOMY;  Surgeon: Milly Jakob, MD;  Location: [facility];  Service: Orthopedics;  Laterality: Left;   CORONARY ANGIOPLASTY WITH STENT PLACEMENT  06/27/2007   L main OK, LAD mild irreg, CFX stent OK, irreg, RCA 60% calcified, EF 45%    HAND SURGERY     left    HARDWARE REMOVAL Left 07/22/2017   Procedure: HARDWARE REMOVAL;  Surgeon: Milly Jakob, MD;  Location: [facility];  Service: Orthopedics;  Laterality: Left;   LUMBAR LAMINECTOMY/DECOMPRESSION MICRODISCECTOMY N/A 05/06/2013   Procedure: Lumbar 4-5 decompression    1 LEVEL;  Surgeon: Sinclair Ship, MD;  Location: [facility];  Service: Orthopedics;  Laterality: N/A;  Lumbar 4-5 decompression   nasal septopalsty     ORIF SCAPHOID FRACTURE Left 01/21/2017   Procedure: OPEN TREATMENT OF LEFT SCAPHOID FRACTURE;  Surgeon: Milly Jakob, MD;  Location: [facility];  Service: Orthopedics;  Laterality: Left;   TOTAL SHOULDER ARTHROPLASTY  04/17/2011   Procedure: TOTAL SHOULDER ARTHROPLASTY; left Surgeon: Nita Sells, MD;  Location: [facility];  Service: Orthopedics;  Laterality: Left;   HPI:  80 y.o. male  who presents from home via EMS as a Code Stroke for acute onset of left hemiplegia, left facial droop and rightward gaze deviation. Dx large right hemispheric ICH originating in BG. PMHx atrial fibrillation (not on anticoagulation), dementia, recent falls, prior cardiac arrest, gout, MI, HLD, HTN, carotid stenosis s/p stenting, sleep apnea. At baseline he is confused, but is able to communicate. He needs assistance with all ADLs. Unable to do finances. He is unable to bathe, toilet, or dress himself. His wife performs all household duties.   Assessment / Plan / Recommendation Clinical Impression  Pt presents with significant dysphagia.  He was alert and able to answer basic biographical questions and follow simple commands for participation.  Presents with CN VII asymmetry on left, decreased sensation left face, left neglect and severe right gaze preference. Tongue/palate are midline.  Pt accepted ice chips from right side (no attempt to manipulate when touching left lips); delayed oral preparation; achieved eventual palpable swallow.  Teaspoons water led to similar significant delay before onset of palpable swallow response.  No further POs given due to level of impairment. 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  SLP will follow-up next date to for ongoing assessment. D/W  [name] and pt's daughters, who were at bedside.  They agree with plan. SLP Visit Diagnosis: Dysphagia, oropharyngeal phase (R13.12)    Aspiration Risk  Severe aspiration risk    Diet Recommendation    NPO except 1-3 ice chips per hour after oral care.      Other  Recommendations Oral Care Recommendations: Oral care prior to ice chip/H20;Oral care QID   Follow up Recommendations Other  (comment) (tba)      Frequency and Duration min 3x week  2 weeks       Prognosis Prognosis for Safe Diet Advancement: Fair Barriers to Reach Goals: Severity of deficits      Swallow Study   General Date of Onset: 11/02/2020 HPI: 80 y.o. male  who presents from home via EMS as a Code Stroke for acute onset of left hemiplegia, left facial droop and rightward gaze deviation. Dx large right hemispheric ICH originating in BG. PMHx atrial fibrillation (not on anticoagulation), dementia, recent falls, prior cardiac arrest, gout, MI, HLD, HTN, carotid stenosis s/p stenting, sleep apnea. At baseline he is confused, but is able to communicate. He needs assistance with all ADLs. Unable to do finances. He is unable to bathe, toilet, or dress himself. His wife performs all household dut Type of Study: Bedside Swallow Evaluation Previous Swallow Assessment: no Diet Prior to this Study: NPO Temperature Spikes Noted: No Respiratory Status: Nasal cannula History of Recent Intubation: No Behavior/Cognition: Alert Oral Cavity Assessment: Within Functional Limits Oral Care Completed by SLP: No Oral Cavity - Dentition: Adequate natural dentition Vision: Impaired for self-feeding Self-Feeding Abilities: Total assist Patient Positioning: Upright in bed Baseline Vocal Quality: Low vocal intensity Volitional Cough: Strong Volitional Swallow: Able to elicit    Oral/Motor/Sensory Function Overall Oral Motor/Sensory Function: Severe impairment Facial ROM: Reduced left;Suspected CN VII (facial) dysfunction Facial Symmetry: Abnormal symmetry left;Suspected CN VII (facial) dysfunction Facial Sensation: Reduced left;Suspected CN V (Trigeminal) dysfunction Lingual Symmetry: Within Functional Limits Velum: Within Functional Limits   Ice Chips Ice chips: Impaired Presentation: Spoon Oral Phase Functional Implications: Prolonged oral transit   Thin Liquid Thin Liquid: Impaired Presentation: Spoon Oral Phase  Impairments: Poor awareness of bolus Oral Phase Functional Implications: Prolonged oral transit Pharyngeal  Phase Impairments: Suspected delayed Swallow    Nectar Thick Nectar Thick Liquid: Not tested   Honey Thick Honey Thick Liquid: Not tested   Puree Puree: Not tested   Solid     Solid: Not tested      [name] [name] 10/14/2020,4:09 PM Estill Bamberg L. Tivis Ringer, [place] Office number [phone] Pager [phone]

## 2020-11-03 NOTE — ED Provider Notes (Addendum)
[facility] EMERGENCY DEPARTMENT Provider Note   CSN: [id number] Arrival date & time: 10/11/2020  [id number]     History Chief Complaint  Patient presents with   Code Stroke    Benjamin George is a 80 y.o. male with PMH of dementia, a fib, HTN,MI, presents with a stroke. Arrived via EMS as a code stroke due to new onset of left sided neglect. Last known well at 3am. Patient able to tell me his name, and that he is in the hospital. States the year is 2020 and was unable to answer why he is here. Neurology bedside who talked with family and confirmed his DNR status.    He was recently in the ED on 7/12 for multiple falls in a 3 day period and increased generalized weakness. Workup did not show any acute concerns and he was discharged. He was in the ED 5/22 for AMS aligned with his dementia in which he returned to baseline and was discharged. On 3/22 hew as seen for AMS and fatigue with negative work up      Past Medical History:  Diagnosis Date   Arthritis    Atrial fibrillation ([place])    Cardiac arrest ([place])    Complication of anesthesia    Gout    Gout    Heart attack ([place])    Heart disease    HLD (hyperlipidemia)    HTN (hypertension)    Memory disorder 12/21/2015   PONV (postoperative nausea and vomiting)    Sleep apnea    NPSG 10/26/07 - AHI 16.4 used5 yrs, uses CPAP nightly    Patient Active Problem List   Diagnosis Date Noted   ICH (intracerebral hemorrhage) ([place]) 10/31/2020   AMS (altered mental status) 06/18/2020   Altered mental status 06/17/2020   Pneumonia due to COVID-19 virus 05/08/2020   Encephalopathy due to COVID-19 virus 05/08/2020   Dementia ([place]) 05/08/2020   Decreased ambulation status 05/07/2020   Impaired ambulation 05/07/2020   Fever    Weakness generalized    Hypotension    CAP (community acquired pneumonia) 05/13/2018   Community acquired pneumonia of left lower lobe of lung 05/12/2018   Ischemic cardiomyopathy 04/18/2017   Memory  disorder 12/21/2015   Spinal stenosis 05/06/2013   Palpitations 03/02/2013   Morbid obesity ([place]) 12/12/2011   Arthritis of shoulder region, 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   CAD (coronary artery disease) 10/24/2010   Preoperative evaluation to rule out surgical contraindication 10/24/2010   Seasonal and perennial allergic rhinitis 05/17/2010   INSOMNIA 05/17/2010   Hyperlipidemia LDL goal <70 04/13/2009   GOUT 04/13/2009   Obstructive sleep apnea 04/13/2009   Essential hypertension 04/13/2009   HEART ATTACK 04/13/2009   CARDIAC ARREST 04/13/2009    Past Surgical History:  Procedure Laterality Date   angioplasty     APPENDECTOMY     CAROTID STENT     CARPECTOMY Left 07/22/2017   Procedure: LEFT WRIST HARDWARE REMOVAL AND PROXIMAL ROW CARPECTOMY;  Surgeon: Milly Jakob, MD;  Location: [facility];  Service: Orthopedics;  Laterality: Left;   CORONARY ANGIOPLASTY WITH STENT PLACEMENT  06/27/2007   L main OK, LAD mild irreg, CFX stent OK, irreg, RCA 60% calcified, EF 45%    HAND SURGERY     left    HARDWARE REMOVAL Left 07/22/2017   Procedure: HARDWARE REMOVAL;  Surgeon: Milly Jakob, MD;  Location: [facility];  Service: Orthopedics;  Laterality: Left;   LUMBAR LAMINECTOMY/DECOMPRESSION MICRODISCECTOMY N/A 05/06/2013   Procedure:  Lumbar 4-5 decompression    1 LEVEL;  Surgeon: Sinclair Ship, MD;  Location: [facility];  Service: Orthopedics;  Laterality: N/A;  Lumbar 4-5 decompression   nasal septopalsty     ORIF SCAPHOID FRACTURE Left 01/21/2017   Procedure: OPEN TREATMENT OF LEFT SCAPHOID FRACTURE;  Surgeon: Milly Jakob, MD;  Location: [facility];  Service: Orthopedics;  Laterality: Left;   TOTAL SHOULDER ARTHROPLASTY  04/17/2011   Procedure: TOTAL SHOULDER ARTHROPLASTY; left Surgeon: Nita Sells, MD;  Location: [facility];  Service: Orthopedics;  Laterality: Left;       Family History  Problem Relation Age of Onset   Heart attack  Father    Heart disease Father    Hyperlipidemia Father    Other Father        Guillain Barre   Alzheimer's disease Mother    Colon cancer Neg Hx    Esophageal cancer Neg Hx    Rectal cancer Neg Hx    Stomach cancer Neg Hx     Social History   Tobacco Use   Smoking status: Former    Packs/day: 1.50    Years: 30.00    Pack years: 45.00    Types: Cigarettes    Quit date: 04/28/1987    Years since quitting: 33.5   Smokeless tobacco: Never   Tobacco comments:    smoked 2 ppd for 32 years; quit in 1989  Vaping Use   Vaping Use: Never used  Substance Use Topics   Alcohol use: Yes    Alcohol/week: 16.0 standard drinks    Types: 14 Cans of beer, 2 Shots of liquor per week    Comment: [place] of beers per day   Drug use: No    Home Medications Prior to Admission medications   Medication Sig Start Date End Date Taking? Authorizing Provider  aspirin 81 MG tablet Take 81 mg by mouth daily.   Yes [provider]  atorvastatin (LIPITOR) 20 MG tablet TAKE 1 TABLET BY MOUTH EVERY DAY 02/20/17  Yes Larey Dresser, MD  cholecalciferol (VITAMIN D3) 25 MCG (1000 UNIT) tablet Take 1,000 Units by mouth daily.   Yes [provider]  linaclotide (LINZESS) 145 MCG CAPS capsule Take 145 mcg by mouth in the morning and at bedtime.   Yes [provider]  memantine (NAMENDA) 10 MG tablet TAKE 1 TABLET BY MOUTH TWICE A DAY 08/15/20  Yes Millikan, Megan, NP  modafinil (PROVIGIL) 100 MG tablet Take 100 mg by mouth daily.   Yes [provider]  Multiple Vitamin (MULTIVITAMIN) tablet Take 1 tablet by mouth every morning.   Yes [provider]  NUCYNTA 50 MG tablet Take 50 mg by mouth in the morning, at noon, and at bedtime. 02/08/20  Yes [provider]  vitamin B-12 (CYANOCOBALAMIN) 500 MCG tablet Take 500 mcg by mouth daily.   Yes [provider]  vitamin C (ASCORBIC ACID) 500 MG tablet Take 500 mg by mouth daily.   Yes [provider]  Vitamin E 100 units TABS Take 1,000 Units by mouth daily.   Yes [provider]  LORazepam (ATIVAN) 0.5 MG tablet Take 0.5 mg by mouth at bedtime as needed. Patient not taking: Reported on 10/09/2020 09/15/20   [provider]  polyethylene glycol (MIRALAX / GLYCOLAX) 17 g packet Take 17 g by mouth daily as needed for mild constipation. Uses it every other day- depending on bowel movements.    [provider]  Allergies    Codeine, Hydrocodone, Aricept [donepezil hcl], Morphine sulfate, Oxycodone hcl, Pregabalin, Methocarbamol, and Other  Review of Systems   Review of Systems  Reason unable to perform ROS: Unable to perform due to patient's mental status.   Physical Exam Updated Vital Signs BP 126/65   Pulse 93   Temp 98.1 F (36.7 C) (Oral)   Resp (!) 22   Ht 6' (1.829 m)   Wt 99.8 kg   SpO2 94%   BMI 29.84 kg/m   Physical Exam Constitutional:      Appearance: He is ill-appearing.  HENT:     Head: Normocephalic and atraumatic.  Eyes:     Comments: Rightward gaze deviation  Conjunctiva erythematous   Cardiovascular:     Rate and Rhythm: Regular rhythm. Tachycardia present.     Pulses: Normal pulses.     Heart sounds: Normal heart sounds.  Pulmonary:     Effort: Pulmonary effort is normal.     Breath sounds: Rhonchi present.  Abdominal:     General: There is no distension.     Palpations: Abdomen is soft.  Musculoskeletal:     Cervical back: Neck supple.     Right lower leg: Edema present.     Left lower leg: Edema present.  Skin:    General: Skin is warm and dry.  Neurological:     Cranial Nerves: Cranial nerve deficit present.     Sensory: Sensory deficit present.     Motor: Weakness present.     Coordination: Coordination abnormal.     Deep Tendon Reflexes: Reflexes abnormal.     Comments: Hemineglect of L side. No movement or sensation  Mild tremors of RUE     ED Results / Procedures / Treatments   Labs (all labs ordered  are listed, but only abnormal results are displayed) Labs Reviewed  COMPREHENSIVE METABOLIC PANEL - Abnormal; Notable for the following components:      Result Value   Sodium 132 (*)    Glucose, Bld 114 (*)    Calcium 8.8 (*)    Total Protein 5.9 (*)    Albumin 3.4 (*)    All other components within normal limits  I-STAT CHEM 8, ED - Abnormal; Notable for the following components:   Sodium 134 (*)    Glucose, Bld 113 (*)    Calcium, Ion 1.07 (*)    All other components within normal limits  CBG MONITORING, ED - Abnormal; Notable for the following components:   Glucose-Capillary 123 (*)    All other components within normal limits  RESP PANEL BY RT-PCR (FLU A&B, COVID) ARPGX2  PROTIME-INR  APTT  CBC  DIFFERENTIAL  PROTIME-INR  APTT  LIPID PANEL  SODIUM  SODIUM  SODIUM    EKG None  Radiology CT HEAD CODE STROKE WO CONTRAST  Result Date: 11/04/2020 CLINICAL DATA:  Code stroke.  Acute neuro deficit, slurred speech EXAM: CT HEAD WITHOUT CONTRAST TECHNIQUE: Contiguous axial images were obtained from the base of the skull through the vertex without intravenous contrast. COMPARISON:  CT head 08/28/2020 FINDINGS: Brain: Large hyperdense hematoma in the right frontal parietal lobe. Hematoma measures 6.9 x 5.7 by 4.4 cm. Blood volume approximately 86 mL. Mild amount of adjacent subarachnoid hemorrhage. There is mass-effect and 5 mm midline shift to the right. There is hypodensity most surrounding the hematoma. There is isodense soft tissue in the right lateral ventricle which is significantly lower density than the acute blood. No mass lesion is seen in  this area on the recent CT. Generalized atrophy. Negative for hydrocephalus. Diffuse white matter hypodensity compatible with chronic microvascular ischemia as noted on the prior study. Vascular: Negative for hyperdense vessel Skull: Negative Sinuses/Orbits: Mucosal edema paranasal sinuses. Bilateral cataract extraction Other: None ASPECTS  ([facility] Stroke Program Early CT Score) Not calculated due to acute hemorrhage. : 1. Large area of acute hemorrhage in the right frontal parietal lobe. 86 mL of hematoma volume. Mild adjacent subarachnoid hemorrhage. There is edema surrounding the hematoma and 5 mm midline shift. There is isodense material within the right lateral ventricle of uncertain etiology. This is significantly lower density than blood. 2. Generalized atrophy with chronic microvascular ischemic change in the white matter. Electronically Signed   By: Franchot Gallo M.D.   On: 11/04/2020 07:41    Procedures Procedures   Medications Ordered in ED Medications  sodium chloride flush (NS) 0.9 % injection 3 mL (3 mLs Intravenous Not Given 10/15/2020 0825)  clevidipine (CLEVIPREX) infusion 0.5 mg/mL (5 mg/hr Intravenous New Bag/Given 10/15/2020 1042)   stroke: mapping our early stages of recovery book (has no administration in time range)  acetaminophen (TYLENOL) tablet 650 mg (has no administration in time range)    Or  acetaminophen (TYLENOL) 160 MG/5ML solution 650 mg (has no administration in time range)    Or  acetaminophen (TYLENOL) suppository 650 mg (has no administration in time range)  senna-docusate (Senokot-S) tablet 1 tablet (1 tablet Oral Not Given 10/26/2020 1043)  pantoprazole (PROTONIX) injection 40 mg (has no administration in time range)  sodium chloride (hypertonic) 3 % solution ( Intravenous New Bag/Given 10/14/2020 [id number])    ED Course  I have reviewed the triage vital signs and the nursing notes.  Pertinent labs & imaging results that were available during my care of the patient were reviewed by me and considered in my medical decision making (see chart for details).    MDM Rules/Calculators/A&P                          Benjamin George is an 80 yo male with PMH of dementia, a fib, HTN,MI, presents with a stroke. Arrived via EMS as a code stroke due to new onset of left sided neglect. Last known well at 3am. Vitals  significant for BP of 206/107, HR 103, RR 23 bpm. On presentation patient A&O x 2. He has complete neglect of left side, rightward high deviation, left facial droop. CT showed large area of acute hemorrhage in right frontal parietal lobe, edema surrounding the hematoma and 5 mm midline shift. Considered tPA but CT showed bleeding so was not given. Goal is to bring down blood pressure. Spoke with Neurology who was at bedside and following patient. Labs significant for sodium of 132, CBC and trop wnl. Patient admitted to ICU and will continue to be followed by Neurology.     Final Clinical Impression(s) / ED Diagnoses Final diagnoses:  None    Rx / DC Orders ED Discharge Orders     None        Shary Key, DO 10/20/2020 [place], [place], DO 10/28/2020 1053    Elnora Morrison, MD 10/22/2020 1734

## 2020-11-03 NOTE — Progress Notes (Signed)
Declining O2 saturations. Now on 6 L per [place]. Will need respiratory consult STAT. The patient is receiving maximal medical therapy but cannot be intubated due to DNR/DNI status.   Electronically signed: Dr. Kerney Elbe

## 2020-11-03 NOTE — H&P (Addendum)
Admission H&P    Chief Complaint: Acute onset of left hemiplegia  HPI: Benjamin George is an 80 y.o. male with atrial fibrillation (not on anticoagulation), recent stroke, dementia, prior cardiac arrest, gout, MI, HLD, HTN, carotid stenosis s/p stenting, sleep apnea who presents from home via EMS as a Code Stroke for acute onset of left hemiplegia, left facial droop and rightward gaze deviation. At baseline he is confused, but is able to communicate. He needs assistance with all ADLs. Unable to do finances. He is unable to bathe, toilet, or dress himself. His wife performs all household duties.   CT head in the ED reveals a large right hemispheric acute hemorrhage, most likely hypertensive in etiology. There is adjacent edema, mass effect with midline shift and compression of the right lateral ventricle.   In review of chart, patient was in ED 10/18/20 after a fall. C/o generalized weakness with multiple falls on 3 days prior to ED visit. No acute findings noted and patient was d/c back home with wife.   In 08/2020, he was seen after acute confusion, more than his normal with baseline dementia. No cause was identified and patient was back at baseline, so he was discharged home with his wife.   In 06/2020, patient presented with generalized fatigue and AMS. On that visit, MRI brain was positive for acute to subacute stroke in the corona radiata, but neurologist did not feel it was acute. His AMS, after negative workup, was thought to be secondary to his long acting narcotic.   Past Medical History:  Diagnosis Date   Arthritis    Atrial fibrillation ([place])    Cardiac arrest ([place])    Complication of anesthesia    Gout    Gout    Heart attack ([place])    Heart disease    HLD (hyperlipidemia)    HTN (hypertension)    Memory disorder 12/21/2015   PONV (postoperative nausea and vomiting)    Sleep apnea    NPSG 10/26/07 - AHI 16.4 used5 yrs, uses CPAP nightly    Past Surgical History:  Procedure  Laterality Date   angioplasty     APPENDECTOMY     CAROTID STENT     CARPECTOMY Left 07/22/2017   Procedure: LEFT WRIST HARDWARE REMOVAL AND PROXIMAL ROW CARPECTOMY;  Surgeon: Milly Jakob, MD;  Location: [place];  Service: Orthopedics;  Laterality: Left;   CORONARY ANGIOPLASTY WITH STENT PLACEMENT  06/27/2007   L main OK, LAD mild irreg, CFX stent OK, irreg, RCA 60% calcified, EF 45%    HAND SURGERY     left    HARDWARE REMOVAL Left 07/22/2017   Procedure: HARDWARE REMOVAL;  Surgeon: Milly Jakob, MD;  Location: [place];  Service: Orthopedics;  Laterality: Left;   LUMBAR LAMINECTOMY/DECOMPRESSION MICRODISCECTOMY N/A 05/06/2013   Procedure: Lumbar 4-5 decompression    1 LEVEL;  Surgeon: Sinclair Ship, MD;  Location: [place];  Service: Orthopedics;  Laterality: N/A;  Lumbar 4-5 decompression   nasal septopalsty     ORIF SCAPHOID FRACTURE Left 01/21/2017   Procedure: OPEN TREATMENT OF LEFT SCAPHOID FRACTURE;  Surgeon: Milly Jakob, MD;  Location: [place];  Service: Orthopedics;  Laterality: Left;   TOTAL SHOULDER ARTHROPLASTY  04/17/2011   Procedure: TOTAL SHOULDER ARTHROPLASTY; left Surgeon: Nita Sells, MD;  Location: [facility];  Service: Orthopedics;  Laterality: Left;    Family History  Problem Relation Age of Onset   Heart attack Father  Heart disease Father    Hyperlipidemia Father    Other Father        Guillain Barre   Alzheimer's disease Mother    Colon cancer Neg Hx    Esophageal cancer Neg Hx    Rectal cancer Neg Hx    Stomach cancer Neg Hx    Social History:  reports that he quit smoking about 33 years ago. His smoking use included cigarettes. He has a 45.00 pack-year smoking history. He has never used smokeless tobacco. He reports current alcohol use of about 16.0 standard drinks of alcohol per week. He reports that he does not use drugs.  Allergies:  Allergies  Allergen Reactions   Codeine  Nausea And Vomiting and Other (See Comments)   Hydrocodone Nausea And Vomiting   Aricept [Donepezil Hcl]     Nausea   Morphine Sulfate Other (See Comments)   Oxycodone Hcl Other (See Comments)    Dizzy and uneasy   Pregabalin Other (See Comments)   Methocarbamol Nausea Only and Other (See Comments)   Other Nausea Only and Other (See Comments)    vibramyacin    No current facility-administered medications on file prior to encounter.   Current Outpatient Medications on File Prior to Encounter  Medication Sig Dispense Refill   aspirin 81 MG tablet Take 81 mg by mouth daily.     atorvastatin (LIPITOR) 20 MG tablet TAKE 1 TABLET BY MOUTH EVERY DAY 90 tablet 3   linaclotide (LINZESS) 145 MCG CAPS capsule Take 145 mcg by mouth in the morning and at bedtime.     memantine (NAMENDA) 10 MG tablet TAKE 1 TABLET BY MOUTH TWICE A DAY 180 tablet 3   modafinil (PROVIGIL) 100 MG tablet Take 100 mg by mouth daily.     Multiple Vitamin (MULTIVITAMIN) tablet Take 1 tablet by mouth every morning.     NUCYNTA 50 MG tablet Take 50 mg by mouth in the morning, at noon, and at bedtime.     polyethylene glycol (MIRALAX / GLYCOLAX) 17 g packet Take 17 g by mouth daily as needed for mild constipation. Uses it every other day- depending on bowel movements.     ROS: Unable to perform robust ROS due to patient's mental status.   Physical Examination: Weight 99.8 kg.  HEENT-  Normocephalic, no lesions, without obvious abnormality.  Normal external eye and conjunctiva.  Normal auditory canals and external ears. Normal external nose, mucus membranes and septum.  Normal pharynx. Neck supple with no masses, nodes, nodules or enlargement. Cardiovascular - RRR on tele.  Lungs - Normal respiratory effort on 3L .  Abdomen - soft, non-tender; bowel sounds normal; no masses,  no organomegaly Extremities - Pink, warm, dry. No LE edema. No clubbing or cyanosis.   Exam: Current vital signs: BP (!) 206/107 (BP  Location: Left Arm)   Pulse (!) 103   Temp 98.1 F (36.7 C) (Oral)   Resp (!) 23   Ht 6' (1.829 m)   Wt 99.8 kg   SpO2 94%   BMI 29.84 kg/m  Vital signs in last 24 hours: Temp:  [98 F (36.7 C)-98.1 F (36.7 C)] 98.1 F (36.7 C) (07/28 0750) Pulse Rate:  [103-112] 103 (07/28 0740) Resp:  [19-23] 23 (07/28 0740) BP: (206)/(107) 206/107 (07/28 0738) SpO2:  [93 %-94 %] 94 % (07/28 0804) Weight:  [99.8 kg] 99.8 kg (07/28 0738)   NEURO:  Mental Status: Alert and oriented to self.  Speech/Language: Speech is sparse and is without  clear receptive or expressive aphasia, but with significant dysarthria. He is able to verbally identify his ear, nose and chin, but has difficulty with finger naming. Unable to repeat phases. Bradyphrenic. Follows some commands and not others.  Cranial Nerves:  II: PERRL. Does not blink to threat on the left. Full left sided neglect.   III, IV, VI: Does not look to the left side. Lid elevation normal OD. Weak OS.  V: Sensation is intact to right face.  VII: Smile with complete facial palsy on the left. Unable to tightly close left lid. Can not raise eyebrow on the left.  VIII:hearing intact to voice. IX, X: palate elevation is symmetric. Phonation hypophonic.  XI: Weak left trapezius muscle  XII: Tongue is symmetrical without fasciculations.   Motor:  RUE: grips  4+/5       triceps 4/5     biceps  4/5      LUE: grips  0/5      triceps  0/5      biceps   0/5 RLE:  knee 4/5    thigh  4/5      plantar flexion  4/5     dorsiflexion   4/5 LLE:  knee  0/5   thigh  0/5     plantar flexion   0/5     dorsiflexion   0/5 Tone is hypotonic on the LUE, LLE. Bulk is normal.  Sensation- Intact to light touch RUE/RLE. Extinction present to the left with light touch DSS.  Coordination: FTN ataxic on right due to chronic tremor. Unable to perform on the left. Does not understand ataxic exam instructions in LEs and unable to lift LLE.  Gait- Unable to assess.  NIHSS:  1a  Level of Consciousness: 0 1b LOC Questions: 2 1c LOC Commands: 2 2 Best Gaze: 2 3 Visual: 2 4 Facial Palsy: 3 5a Motor Arm - left: 4 5b Motor Arm - Right: 0 6a Motor Leg - Left: 4 6b Motor Leg - Right: 1 7 Limb Ataxia: 2 8 Sensory: 2 9 Best Language: 0 10 Dysarthria: 2 11 Extinction and Inattention: 1 TOTAL: 27  Labs I have reviewed labs in epic and the results pertinent to this consultation are:  CBC    Component Value Date/Time   WBC 9.8 10/07/2020 0727   RBC 4.70 10/19/2020 0727   HGB 13.6 11/02/2020 0732   HCT 40.0 10/08/2020 0732   PLT 272 10/18/2020 0727   MCV 88.5 10/30/2020 0727   MCH 30.4 10/29/2020 0727   MCHC 34.4 10/19/2020 0727   RDW 12.9 10/23/2020 0727   LYMPHSABS 2.5 10/28/2020 0727   MONOABS 0.8 10/22/2020 0727   EOSABS 0.3 10/28/2020 0727   BASOSABS 0.1 10/20/2020 0727    CMP     Component Value Date/Time   NA 134 (L) 10/10/2020 0732   K 4.1 10/21/2020 0732   CL 100 10/11/2020 0732   CO2 26 10/20/2020 0727   GLUCOSE 113 (H) 10/21/2020 0732   BUN 17 11/04/2020 0732   CREATININE 1.00 10/24/2020 0732   CREATININE 1.35 (H) 03/16/2016 1653   CALCIUM 8.8 (L) 10/31/2020 0727   PROT 5.9 (L) 10/27/2020 0727   ALBUMIN 3.4 (L) 10/09/2020 0727   AST 19 10/20/2020 0727   ALT 18 10/28/2020 0727   ALKPHOS 79 10/10/2020 0727   BILITOT 0.7 11/01/2020 0727   GFRNONAA >60 10/08/2020 0727   GFRAA >60 10/24/2019 1511   Assessment: 80 year old male with multiple comorbidities presenting with a  large ICH in the right cerebral hemisphere, most likely hypertensive and originating in the basal ganglia.  1. Exam reveals complete left sided neglect, left hemiplegia, severe dysarthria and significant facial palsy.  2. CT head: Large area of acute hemorrhage in the right frontal parietal lobe. 86 mL of hematoma volume. Mild adjacent subarachnoid hemorrhage. There is edema surrounding the hematoma and 5 mm midline shift. There is isodense material within the right  lateral ventricle of uncertain etiology. This is significantly lower density than blood. Generalized atrophy with chronic microvascular ischemic change in the white matter.  Plan: 1. Admit to ICU under Neurology service 2. If family does not want to pursue palliative route and prefers maximal medical management, then will obtain CTA of head and neck as well as TTE. The patient has a "nerve stimulator implanted in his back" per wife and therefore cannot have an MRI.  3. PT consult, OT consult, Speech consult 4. NPO for now, but doubt he will pass swallow evaluation.  5. Cardiac telemetry 6. Frequent neuro checks 7. Stop ASA. No antiplatelet medications or anticoagulants. 8. Hypertonic saline 3% at 50 cc/hr 9. BP management with clevidipine drip  10. DVT prophylaxis with SCDs  11. Neurosurgical intervention, including placement of a ventriculostomy catheter, would be futile and/or not indicated from a risk/benefit standpoint due to multiple medical comorbidities, as well as advanced age/frailty.  12. DNR/DNI.   60 minutes spent in the emergent neurological evaluation and management of this critically ill patient.   Electronically signed: Dr. Kerney Elbe 10/25/2020, 7:35 AM

## 2020-11-03 NOTE — Evaluation (Signed)
Speech Language Pathology Evaluation Patient Details Name: Benjamin George MRN: [id number] DOB: 1940-11-10 Today's Date: 10/28/2020 Time: 1515-1550 SLP Time Calculation (min) (ACUTE ONLY): 35 min  Problem List:  Patient Active Problem List   Diagnosis Date Noted   ICH (intracerebral hemorrhage) ([place]) 10/20/2020   AMS (altered mental status) 06/18/2020   Altered mental status 06/17/2020   Pneumonia due to COVID-19 virus 05/08/2020   Encephalopathy due to COVID-19 virus 05/08/2020   Dementia ([place]) 05/08/2020   Decreased ambulation status 05/07/2020   Impaired ambulation 05/07/2020   Fever    Weakness generalized    Hypotension    CAP (community acquired pneumonia) 05/13/2018   Community acquired pneumonia of left lower lobe of lung 05/12/2018   Ischemic cardiomyopathy 04/18/2017   Memory disorder 12/21/2015   Spinal stenosis 05/06/2013   Palpitations 03/02/2013   Morbid obesity ([place]) 12/12/2011   Arthritis of shoulder region, 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   CAD (coronary artery disease) 10/24/2010   Preoperative evaluation to rule out surgical contraindication 10/24/2010   Seasonal and perennial allergic rhinitis 05/17/2010   INSOMNIA 05/17/2010   Hyperlipidemia LDL goal <70 04/13/2009   GOUT 04/13/2009   Obstructive sleep apnea 04/13/2009   Essential hypertension 04/13/2009   HEART ATTACK 04/13/2009   CARDIAC ARREST 04/13/2009   Past Medical History:  Past Medical History:  Diagnosis Date   Arthritis    Atrial fibrillation ([place])    Cardiac arrest ([place])    Complication of anesthesia    Gout    Gout    Heart attack ([place])    Heart disease    HLD (hyperlipidemia)    HTN (hypertension)    Memory disorder 12/21/2015   PONV (postoperative nausea and vomiting)    Sleep apnea    NPSG 10/26/07 - AHI 16.4 used5 yrs, uses CPAP nightly   Past Surgical History:  Past Surgical History:  Procedure Laterality Date   angioplasty     APPENDECTOMY     CAROTID STENT     CARPECTOMY Left  07/22/2017   Procedure: LEFT WRIST HARDWARE REMOVAL AND PROXIMAL ROW CARPECTOMY;  Surgeon: Milly Jakob, MD;  Location: [facility];  Service: Orthopedics;  Laterality: Left;   CORONARY ANGIOPLASTY WITH STENT PLACEMENT  06/27/2007   L main OK, LAD mild irreg, CFX stent OK, irreg, RCA 60% calcified, EF 45%    HAND SURGERY     left    HARDWARE REMOVAL Left 07/22/2017   Procedure: HARDWARE REMOVAL;  Surgeon: Milly Jakob, MD;  Location: [place];  Service: Orthopedics;  Laterality: Left;   LUMBAR LAMINECTOMY/DECOMPRESSION MICRODISCECTOMY N/A 05/06/2013   Procedure: Lumbar 4-5 decompression    1 LEVEL;  Surgeon: Sinclair Ship, MD;  Location: [facility];  Service: Orthopedics;  Laterality: N/A;  Lumbar 4-5 decompression   nasal septopalsty     ORIF SCAPHOID FRACTURE Left 01/21/2017   Procedure: OPEN TREATMENT OF LEFT SCAPHOID FRACTURE;  Surgeon: Milly Jakob, MD;  Location: [facility];  Service: Orthopedics;  Laterality: Left;   TOTAL SHOULDER ARTHROPLASTY  04/17/2011   Procedure: TOTAL SHOULDER ARTHROPLASTY; left Surgeon: Nita Sells, MD;  Location: [facility];  Service: Orthopedics;  Laterality: Left;   HPI:  80 y.o. male  who presents from home via EMS as a Code Stroke for acute onset of left hemiplegia, left facial droop and rightward gaze deviation. Dx large right hemispheric ICH originating in BG. PMHx atrial fibrillation (not on anticoagulation), dementia, recent falls, prior cardiac arrest, gout, MI, HLD, HTN,  carotid stenosis s/p stenting, sleep apnea. At baseline he is confused, but is able to communicate. He needs assistance with all ADLs. Unable to do finances. He is unable to bathe, toilet, or dress himself. His wife performs all household duties.  Assessment / Plan / Recommendation Clinical Impression  Pt presents with significant left neglect, right gaze preference, delays in response time and decreased initiation, mod-severe  dysarthria with dysfluency and initial sound repetitions.  Pt was able to be roused for brief periods of interaction, during which he followed simple commands and answered biographical questions with first-sound cueing. SLP will follow for basic communication while admitted to acute care.  He is awaiting a bed in the neuro ICU; discussed plan with pt's wife and daughters.    SLP Assessment  SLP Recommendation/Assessment: Patient needs continued Speech Lanaguage Pathology Services SLP Visit Diagnosis: Cognitive communication deficit (R41.841)    Follow Up Recommendations  Other (comment) (tba)    Frequency and Duration min 3x week         SLP Evaluation Cognition  Overall Cognitive Status: Impaired/Different from baseline Arousal/Alertness: Awake/alert Orientation Level: Oriented to person Attention: Focused Focused Attention: Impaired Focused Attention Impairment: Verbal basic Awareness: Impaired Awareness Impairment: Intellectual impairment Problem Solving: Impaired Problem Solving Impairment: Verbal basic       Comprehension  Auditory Comprehension Commands:  (followed one step commands) Reading Comprehension Reading Status: Not tested    Expression Expression Primary Mode of Expression: Verbal Verbal Expression Overall Verbal Expression: Impaired Initiation: Impaired Level of Generative/Spontaneous Verbalization: Phrase Repetition: Impaired Written Expression Dominant Hand: Right   Oral / Motor  Oral Motor/Sensory Function Overall Oral Motor/Sensory Function: Severe impairment Facial ROM: Reduced left;Suspected CN VII (facial) dysfunction Facial Symmetry: Abnormal symmetry left;Suspected CN VII (facial) dysfunction Facial Sensation: Reduced left;Suspected CN V (Trigeminal) dysfunction Lingual Symmetry: Within Functional Limits Velum: Within Functional Limits Motor Speech Overall Motor Speech: Impaired Respiration: Within functional limits Phonation: Low vocal  intensity Resonance: Within functional limits Articulation: Impaired Level of Impairment: Word Intelligibility: Intelligibility reduced Word: 50-74% accurate Phrase: 25-49% accurate Sentence: 25-49% accurate Motor Planning: Impaired Level of Impairment: Phrase   GO            Jacarra Bobak L. Tivis Ringer, [place] CCC/SLP Acute Rehabilitation Services Office number [phone] Pager [phone]         Assunta Curtis 11/02/2020, 4:19 PM

## 2020-11-03 NOTE — ED Notes (Signed)
Notified Dr Cheral Marker re: pt HR trending up, awaiting response

## 2020-11-03 NOTE — ED Notes (Signed)
Pt had noted bm. While changing pt and pt was on his side pt had a noted vomiting episode with bloody secretions

## 2020-11-03 NOTE — ED Notes (Signed)
Resp at bedside

## 2020-11-03 NOTE — ED Notes (Signed)
Updated Dr Cheral Marker on pt's change in oxygen status, increased oxygen need and decreased saturation

## 2020-11-03 NOTE — ED Notes (Signed)
Attempted to page provider reference same

## 2020-11-03 NOTE — ED Notes (Addendum)
RN changed brief, repositioned. Redness noted to buttocks, positioned on L side. Suctioned oropharyngeal space for secretions. Family at bedside, updated on POC.

## 2020-11-04 DIAGNOSIS — I611 Nontraumatic intracerebral hemorrhage in hemisphere, cortical: Secondary | ICD-10-CM | POA: Diagnosis not present

## 2020-11-04 DIAGNOSIS — G936 Cerebral edema: Secondary | ICD-10-CM

## 2020-11-04 LAB — SODIUM
Sodium: 138 mmol/L (ref 135–145)
Sodium: 140 mmol/L (ref 135–145)
Sodium: 142 mmol/L (ref 135–145)

## 2020-11-04 LAB — MRSA NEXT GEN BY PCR, NASAL: MRSA by PCR Next Gen: NOT DETECTED

## 2020-11-04 MED ORDER — LORAZEPAM 2 MG/ML IJ SOLN
1.0000 mg | INTRAMUSCULAR | Status: DC | PRN
  Administered 2020-11-04: 1 mg via INTRAVENOUS
  Filled 2020-11-04: qty 1

## 2020-11-04 MED ORDER — CHLORHEXIDINE GLUCONATE CLOTH 2 % EX PADS
6.0000 | MEDICATED_PAD | Freq: Every day | CUTANEOUS | Status: DC
  Administered 2020-11-04: 6 via TOPICAL

## 2020-11-04 MED ORDER — SODIUM CHLORIDE 0.9 % IV SOLN
1.0000 mg/h | INTRAVENOUS | Status: DC
  Administered 2020-11-04 – 2020-11-05 (×2): 1 mg/h via INTRAVENOUS
  Filled 2020-11-04 (×3): qty 2.5

## 2020-11-04 MED ORDER — CHLORHEXIDINE GLUCONATE 0.12 % MT SOLN
15.0000 mL | Freq: Two times a day (BID) | OROMUCOSAL | Status: DC
  Administered 2020-11-04 – 2020-11-05 (×3): 15 mL via OROMUCOSAL

## 2020-11-04 MED ORDER — GLYCOPYRROLATE 0.2 MG/ML IJ SOLN
0.1000 mg | Freq: Once | INTRAMUSCULAR | Status: AC
  Administered 2020-11-04: 0.1 mg via INTRAVENOUS
  Filled 2020-11-04: qty 1

## 2020-11-04 MED ORDER — SCOPOLAMINE 1 MG/3DAYS TD PT72
1.0000 | MEDICATED_PATCH | TRANSDERMAL | Status: DC
  Administered 2020-11-04: 1.5 mg via TRANSDERMAL
  Filled 2020-11-04: qty 1

## 2020-11-04 MED ORDER — HYDROMORPHONE HCL 1 MG/ML IJ SOLN
1.0000 mg | INTRAMUSCULAR | Status: DC | PRN
Start: 2020-11-04 — End: 2020-11-05
  Administered 2020-11-04: 1 mg via INTRAVENOUS
  Filled 2020-11-04: qty 1

## 2020-11-04 MED ORDER — ORAL CARE MOUTH RINSE: 15.0000 mL | Freq: Two times a day (BID) | OROMUCOSAL | Status: DC

## 2020-11-04 NOTE — Progress Notes (Signed)
SLP Note  Patient Details Name: Benjamin George MRN: [id number] DOB: 08-07-1940   Spoke with Dr. Erlinda Hong; family has decided to transition to comfort care. Our service will respectfully sign off.    [name] L. Tivis Ringer, [place] Office number [phone] Pager [phone]           [name] [name] 11/04/2020, 10:12 AM

## 2020-11-04 NOTE — Progress Notes (Signed)
PT Cancellation Note  Patient Details Name: Benjamin George MRN: [id number] DOB: 11/28/1940   Cancelled Treatment:    Reason Eval/Treat Not Completed: Patient not medically ready. RN reporting pt is planning to transition to comfort care. PT will sign off at this time. Please re-consult PT if needed.   Moishe Spice, PT, DPT Acute Rehabilitation Services  Pager: [phone] Office: [place] 11/04/2020, 10:07 AM

## 2020-11-04 NOTE — Progress Notes (Signed)
This chaplain responded to the RN-Nick page for spiritual care as the Pt. transitions to comfort care measures.  The Pt. preferred name is "Benjamin George".  The Pt. wife and daughters are at the bedside.  Prayer was shared with the Pt. and family after a time of story telling. The chaplain understands the family points to the Pt. relationship with God as the foundation of the Pt. love for everyone.  The chaplain offered F/U spiritual care as needed, [phone].

## 2020-11-04 NOTE — Progress Notes (Signed)
Occupational Therapy Discharge Patient Details Name: Benjamin George MRN: [id number] DOB: 1940/05/31 Today's Date: 11/04/2020 Time:  -     Patient discharged from OT services secondary to  now comfort care .  Please see latest therapy progress note for current level of functioning and progress toward goals.    Progress and discharge plan discussed with patient and/or caregiver: Patient unable to participate in discharge planning and no caregivers available  GO    Fleeta Emmer, OTR/L  Acute Rehabilitation Services Pager: [phone] Office: [phone] .   Jeri Modena 11/04/2020, 10:19 AM

## 2020-11-04 NOTE — Progress Notes (Addendum)
STROKE TEAM PROGRESS NOTE   INTERVAL HISTORY His wife and dtr are at the bedside. Pt is critically ill and in acute respiratory distress. We reviewed the [facility] with family and explained f/u imaging showed some increased ICH volume, but increased cerebral edema, brain compression. Family request CMO at this time and he is a DNR/DNI.   Vitals:   11/04/20 1200 11/04/20 1300 11/04/20 1400 11/04/20 1500  BP:      Pulse: (!) 101 97 98 100  Resp: (!) 26 (!) 30 (!) 27 (!) 25  Temp:      TempSrc:      SpO2: (!) 80% (!) 84% (!) 83% (!) 82%  Weight:      Height:       CBC:  Recent Labs  Lab 10/21/2020 0727 10/25/2020 0732  WBC 9.8  --   NEUTROABS 6.1  --   HGB 14.3 13.6  HCT 41.6 40.0  MCV 88.5  --   PLT 272  --    Basic Metabolic Panel:  Recent Labs  Lab 10/28/2020 0727 10/24/2020 0732 10/17/2020 1450 11/04/20 0215 11/04/20 0736  NA 132* 134*   < > 138 140  K 4.1 4.1  --   --   --   CL 100 100  --   --   --   CO2 26  --   --   --   --   GLUCOSE 114* 113*  --   --   --   BUN 16 17  --   --   --   CREATININE 0.98 1.00  --   --   --   CALCIUM 8.8*  --   --   --   --    < > = values in this interval not displayed.   Lipid Panel:  Recent Labs  Lab 11/01/2020 1450  CHOL 154  TRIG 37  HDL 59  CHOLHDL 2.6  VLDL 7  LDLCALC 88   HgbA1c: No results for input(s): HGBA1C in the last 168 hours. Urine Drug Screen: No results for input(s): LABOPIA, COCAINSCRNUR, LABBENZ, AMPHETMU, THCU, LABBARB in the last 168 hours.  Alcohol Level No results for input(s): ETH in the last 168 hours.  IMAGING past 24 hours CT HEAD WO CONTRAST  Result Date: 10/29/2020 CLINICAL DATA:  Cerebral hemorrhage suspected. EXAM: CT HEAD WITHOUT CONTRAST TECHNIQUE: Contiguous axial images were obtained from the base of the skull through the vertex without intravenous contrast. COMPARISON:  10/16/2020 at 7:33 a.m. FINDINGS: Brain: A large parenchymal hematoma in the right frontoparietal region measures 5.9 x 7.7 x 6.7 cm  (estimated volume of 152 mL), not significantly changed upon remeasurement. Moderate surrounding vasogenic edema and 6 mm of leftward midline shift are similar to the prior CT. A small amount of subarachnoid hemorrhage is again noted with some interval redistribution. The dominant parenchymal hemorrhage is again noted to extend into the body of the right lateral ventricle. The intermediate density material in the right lateral ventricle on the prior CT has decreased. There is small volume hyperdense hemorrhage in the right greater than left lateral ventricles which has increased. The ventricles are unchanged in size. Background moderate cerebral atrophy and extensive chronic small vessel ischemia in the cerebral white matter again noted. The basilar cisterns remain patent. There is no extra-axial fluid collection. Vascular: Calcified atherosclerosis at the skull base. Skull: No fracture suspicious osseous lesion. Sinuses/Orbits: Unchanged near complete opacification of the left frontal sinus. Moderate left greater than right ethmoid air  cell opacification. Nodular soft tissue in the nasal cavity bilaterally suggesting polyps. Clear mastoid air cells. Bilateral cataract extraction. Other: None. IMPRESSION: 1. Large right frontoparietal parenchymal hematoma with moderate edema and 6 mm of leftward midline shift, not significantly changed. 2. Increased small volume intraventricular hemorrhage. 3. Similar small volume subarachnoid hemorrhage. Electronically Signed   By: Logan Bores M.D.   On: 10/27/2020 20:28    PHYSICAL EXAM General: Appears well-developed, acute distress, critically ill. Psych: Affect appropriate to situation; unable Eyes: No scleral injection HENT: No OP obstrucion Head: Normocephalic.  Cardiovascular: Irrg Respiratory: appears to be in acute respiratory distress, rapid and irreg shallow breathing, lungs are with course crackles and rhonchi throughout. NRB placed GI: Soft.  No distension.  There is no tenderness.  Skin: WDI    Neurological Examination Mental Status: Lethargic, barely alerts to voice + tactile, poorly attends, did squeeze hand on right briefly. He has a right gaze and left hemiparesis and decreased sensation to noxious stimuli on left. limited exam out of respect for comfort care.   ASSESSMENT/PLAN Benjamin George is a 80 y.o. 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  ICH secondary to HTN Code Stroke CT head No acute abnormality. Small vessel disease. Atrophy.Large frontal hemorrhage with some IVH noted 2D Echo penidng LDL 88 HgbA1c No results found for requested labs within last 26280 hours. VTE prophylaxis - SCDs d/t bleed    Diet   Diet NPO time specified   aspirin 81 mg daily prior to admission, now on  none .  Therapy recommendations:  CMO Disposition:  Hospice  Hypertension Home meds:  none Unstable SBP goal <140, Cleviprex gtt started Long-term BP goal normotensive  Hyperlipidemia Home meds:  lipitor '20mg'$ , resumed in hospital LDL 88, goal < 70 High intensity statin not indicated  Diabetes type II no dx HgbA1c No results found for requested labs within last 26280 hours., goal < 7.0 CBGs Recent Labs    10/29/2020 0725  GLUCAP 38*    SSI  Other Stroke Risk Factors Advanced Age >/= 64  Dementia Afib OSA CAD with h/o Cardiac arrest Former tobacco smoker  Other Active Problems Acute respiratory failure- but DNR/DNI GOC discussion: Family appropriately want to move to [place] at this time. Comfort care ordered and Dilaudid gtt ordered. No further aggressive care.   Hospital day # 1  Desiree Metzger-Cihelka, ARNP-C, ANVP-BC Pager: [phone]   ATTENDING NOTE: I reviewed above note and agree with the assessment and plan. Pt was seen and examined.   80 year old admitted for large right  frontoparietal and subcortical ICH with cerebral edema and midline shift.  Repeat CT showed mildly increased hematoma size and midline shift.  However, on examination today, patient was obtunded with sonorous breathing, significant respiratory distress with gasping for air, on nonrebreather.  Reviewed CT images with wife and daughter, a long discussion with wife and daughter, they both endorse that patient has living will that would not want to live in such situation that high risk of nonsurvival and high likelihood of severe disability with no meaningful recovery. They are in unanimous decision (as per patient wishes) that they want comfort care given the nature of the condition is serious with a poor prognosis. Pt has expressed in the living will that if he suffers a devastating injury with no chance of meaningful recovery then wanted comfort care only.  He is  DNR/DNI.  As per patient and family wishes we initiated comfort care with dilaudid bolus and drip as well as Rubinol and scopolamine patch.  For detailed assessment and plan, please refer to above as I have made changes wherever appropriate.   This patient is critically ill due to right large ICH with cerebral edema and midline shift, respiratory distress and at significant risk of neurological worsening, death form brain herniation, respiratory failure, heart failure. This patient's care requires constant monitoring of vital signs, hemodynamics, respiratory and cardiac monitoring, review of multiple databases, neurological assessment, discussion with family, other specialists and medical decision making of high complexity. I spent 50 minutes of neurocritical care time in the care of this patient.   Rosalin Hawking, MD PhD Stroke Neurology 11/04/2020 11:58 PM    To contact Stroke Continuity provider, please refer to [email or web address] After hours, contact General Neurology

## 2020-11-04 NOTE — Progress Notes (Signed)
Orders were put in by Dr. Erlinda Hong and PA Diseree for comfort care measures.  Medications were stopped and oxygen was turned off.  Patient was given a dose of dilaudid and a dose of glycopyorlate.  Wife and daughters are at bedside and a chaplain was paged.

## 2020-11-05 DIAGNOSIS — J9601 Acute respiratory failure with hypoxia: Secondary | ICD-10-CM | POA: Diagnosis not present

## 2020-11-05 DIAGNOSIS — I619 Nontraumatic intracerebral hemorrhage, unspecified: Secondary | ICD-10-CM

## 2020-11-05 DIAGNOSIS — Z7189 Other specified counseling: Secondary | ICD-10-CM | POA: Diagnosis not present

## 2020-11-05 DIAGNOSIS — Z515 Encounter for palliative care: Secondary | ICD-10-CM

## 2020-11-05 MED ORDER — GLYCOPYRROLATE 0.2 MG/ML IJ SOLN
0.2000 mg | INTRAMUSCULAR | Status: DC | PRN
  Administered 2020-11-05: 0.2 mg via INTRAVENOUS
  Filled 2020-11-05: qty 1

## 2020-11-07 ENCOUNTER — Telehealth: Payer: Self-pay | Admitting: Internal Medicine

## 2020-11-07 NOTE — Death Summary Note (Signed)
DEATH SUMMARY   Patient Details  Name: Benjamin George MRN: [id number] DOB: [birth year]  Admission/Discharge Information   Admit Date:  2020-11-25  Date of Death: Date of Death: 11/27/20  Time of Death: Time of Death: 18-Jul-1603  Length of Stay: 2  Referring Physician: [facility] [place]   Reason(s) for Hospitalization  ICH  Diagnoses  Preliminary cause of death: ICH (intracerebral hemorrhage) ([place]) Secondary Diagnoses (including complications and co-morbidities):  Active Problems:   ICH (intracerebral hemorrhage) [facility])   Brief Hospital Course (including significant findings, care, treatment, and services provided and events leading to death)  [name] is a 80 y.o. year old male who presented to [facility] ER via EMS as a Code Stroke with left side weakness and right gaze. Stat CTH showed large acute non-traumatic ICH in the right hemisphere with mass effect and midline shift with brain compression. F/u imaging next morning showed worsening with IVH. Pt developed respiratory decline and family opted to not intubate and made him DNR/DNI and transitioned to [place]. He was not stable to transfer to hospice care and was provided palliative care with dilaudid gtt. He passed away with family at his side at approx 1605 on 11-28-22.    Pertinent Labs and Studies  Significant Diagnostic Studies DG Chest 2 View  Result Date: 10/18/2020 CLINICAL DATA:  Weakness and leg swelling following fall, initial encounter EXAM: CHEST - 2 VIEW COMPARISON:  08/28/2020 FINDINGS: Cardiac shadow is within normal limits. Aortic calcifications are noted. Lungs are well aerated bilaterally. No focal infiltrate or sizable effusion is noted. Spinal stimulator is seen as well as left shoulder replacement. IMPRESSION: No acute abnormality noted. Electronically Signed   By: Inez Catalina M.D.   On: 10/18/2020 19:20   CT HEAD WO CONTRAST  Result Date: 25-Nov-2020 CLINICAL DATA:  Cerebral hemorrhage suspected. EXAM: CT  HEAD WITHOUT CONTRAST TECHNIQUE: Contiguous axial images were obtained from the base of the skull through the vertex without intravenous contrast. COMPARISON:  2020-11-25 at 7:33 a.m. FINDINGS: Brain: A large parenchymal hematoma in the right frontoparietal region measures 5.9 x 7.7 x 6.7 cm (estimated volume of 152 mL), not significantly changed upon remeasurement. Moderate surrounding vasogenic edema and 6 mm of leftward midline shift are similar to the prior CT. A small amount of subarachnoid hemorrhage is again noted with some interval redistribution. The dominant parenchymal hemorrhage is again noted to extend into the body of the right lateral ventricle. The intermediate density material in the right lateral ventricle on the prior CT has decreased. There is small volume hyperdense hemorrhage in the right greater than left lateral ventricles which has increased. The ventricles are unchanged in size. Background moderate cerebral atrophy and extensive chronic small vessel ischemia in the cerebral white matter again noted. The basilar cisterns remain patent. There is no extra-axial fluid collection. Vascular: Calcified atherosclerosis at the skull base. Skull: No fracture suspicious osseous lesion. Sinuses/Orbits: Unchanged near complete opacification of the left frontal sinus. Moderate left greater than right ethmoid air cell opacification. Nodular soft tissue in the nasal cavity bilaterally suggesting polyps. Clear mastoid air cells. Bilateral cataract extraction. Other: None. IMPRESSION: 1. Large right frontoparietal parenchymal hematoma with moderate edema and 6 mm of leftward midline shift, not significantly changed. 2. Increased small volume intraventricular hemorrhage. 3. Similar small volume subarachnoid hemorrhage. Electronically Signed   By: Logan Bores M.D.   On: Nov 25, 2020 20:28   CT HEAD CODE STROKE WO CONTRAST  Result Date: 11-25-20 CLINICAL DATA:  Code stroke.  Acute neuro deficit, slurred  speech EXAM: CT HEAD WITHOUT CONTRAST TECHNIQUE: Contiguous axial images were obtained from the base of the skull through the vertex without intravenous contrast. COMPARISON:  CT head 08/28/2020 FINDINGS: Brain: Large hyperdense hematoma in the right frontal parietal lobe. Hematoma measures 6.9 x 5.7 by 4.4 cm. Blood volume approximately 86 mL. Mild amount of adjacent subarachnoid hemorrhage. There is mass-effect and 5 mm midline shift to the right. There is hypodensity most surrounding the hematoma. There is isodense soft tissue in the right lateral ventricle which is significantly lower density than the acute blood. No mass lesion is seen in this area on the recent CT. Generalized atrophy. Negative for hydrocephalus. Diffuse white matter hypodensity compatible with chronic microvascular ischemia as noted on the prior study. Vascular: Negative for hyperdense vessel Skull: Negative Sinuses/Orbits: Mucosal edema paranasal sinuses. Bilateral cataract extraction Other: None ASPECTS ([place] Stroke Program Early CT Score) Not calculated due to acute hemorrhage. : 1. Large area of acute hemorrhage in the right frontal parietal lobe. 86 mL of hematoma volume. Mild adjacent subarachnoid hemorrhage. There is edema surrounding the hematoma and 5 mm midline shift. There is isodense material within the right lateral ventricle of uncertain etiology. This is significantly lower density than blood. 2. Generalized atrophy with chronic microvascular ischemic change in the white matter. Electronically Signed   By: Franchot Gallo M.D.   On: 10/15/2020 07:41   VAS [place] LOWER EXTREMITY VENOUS (DVT) (ONLY [facility] & [facility] 7a-7p)  Result Date: 10/19/2020  Lower Venous DVT Study Patient Name:  Benjamin George  Date of Exam:   10/18/2020 Medical Rec #: [id number]         Accession #:    [id number] Date of Birth: 15-Mar-1941         Patient Gender: M Patient Age:   80Y Exam Location:  [facility] Procedure:      VAS [place] LOWER EXTREMITY  VENOUS (DVT) Referring Phys: [id number] HINA KHATRI --------------------------------------------------------------------------------  Indications: Edema.  Comparison Study: No previous exams Performing Technologist: Jody Hill RVT, RDMS  Examination Guidelines: A complete evaluation includes B-mode imaging, spectral Doppler, color Doppler, and power Doppler as needed of all accessible portions of each vessel. Bilateral testing is considered an integral part of a complete examination. Limited examinations for reoccurring indications may be performed as noted. The reflux portion of the exam is performed with the patient in reverse Trendelenburg.  +-----+---------------+---------+-----------+----------+--------------+ RIGHTCompressibilityPhasicitySpontaneityPropertiesThrombus Aging +-----+---------------+---------+-----------+----------+--------------+ CFV  Full           Yes      Yes                                 +-----+---------------+---------+-----------+----------+--------------+   +---------+---------------+---------+-----------+----------+--------------+ LEFT     CompressibilityPhasicitySpontaneityPropertiesThrombus Aging +---------+---------------+---------+-----------+----------+--------------+ CFV      Full           Yes      Yes                                 +---------+---------------+---------+-----------+----------+--------------+ SFJ      Full                                                        +---------+---------------+---------+-----------+----------+--------------+  FV Prox  Full           Yes      Yes                                 +---------+---------------+---------+-----------+----------+--------------+ FV Mid   Full           Yes      Yes                                 +---------+---------------+---------+-----------+----------+--------------+ FV DistalFull           Yes      Yes                                  +---------+---------------+---------+-----------+----------+--------------+ PFV      Full                                                        +---------+---------------+---------+-----------+----------+--------------+ POP      Full           Yes      Yes                                 +---------+---------------+---------+-----------+----------+--------------+ PTV      Full                                                        +---------+---------------+---------+-----------+----------+--------------+ PERO     Full                                                        +---------+---------------+---------+-----------+----------+--------------+     Summary: RIGHT: - No evidence of common femoral vein obstruction.  LEFT: - There is no evidence of deep vein thrombosis in the lower extremity. - There is no evidence of superficial venous thrombosis.  - No cystic structure found in the popliteal fossa.  *See table(s) above for measurements and observations. Electronically signed by Jamelle Haring on 10/19/2020 at 7:18:52 PM.    Final     Microbiology Recent Results (from the past 240 hour(s))  Resp Panel by RT-PCR (Flu A&B, Covid) Nasopharyngeal Swab     Status: None   Collection Time: 10/31/2020  7:25 AM   Specimen: Nasopharyngeal Swab; Nasopharyngeal(NP) swabs in vial transport medium  Result Value Ref Range Status   SARS Coronavirus 2 by RT PCR NEGATIVE NEGATIVE Final    Comment: (NOTE) SARS-CoV-2 target nucleic acids are NOT DETECTED.  The SARS-CoV-2 RNA is generally detectable in upper respiratory specimens during the acute phase of infection. The lowest concentration of SARS-CoV-2 viral copies this assay can detect is 138 copies/mL. A negative result does not preclude SARS-Cov-2 infection and should not be used as the sole  basis for treatment or other patient management decisions. A negative result may occur with  improper specimen collection/handling, submission of  specimen other than nasopharyngeal swab, presence of viral mutation(s) within the areas targeted by this assay, and inadequate number of viral copies(<138 copies/mL). A negative result must be combined with clinical observations, patient history, and epidemiological information. The expected result is Negative.  Fact Sheet for Patients:  EntrepreneurPulse.com.au  Fact Sheet for Healthcare Providers:  IncredibleEmployment.be  This test is no t yet approved or cleared by the [place] FDA and  has been authorized for detection and/or diagnosis of SARS-CoV-2 by FDA under an Emergency Use Authorization (EUA). This EUA will remain  in effect (meaning this test can be used) for the duration of the COVID-19 declaration under Section 564(b)(1) of the Act, 21 U.S.C.section 360bbb-3(b)(1), unless the authorization is terminated  or revoked sooner.       Influenza A by PCR NEGATIVE NEGATIVE Final   Influenza B by PCR NEGATIVE NEGATIVE Final    Comment: (NOTE) The Xpert Xpress SARS-CoV-2/FLU/RSV plus assay is intended as an aid in the diagnosis of influenza from Nasopharyngeal swab specimens and should not be used as a sole basis for treatment. Nasal washings and aspirates are unacceptable for Xpert Xpress SARS-CoV-2/FLU/RSV testing.  Fact Sheet for Patients: EntrepreneurPulse.com.au  Fact Sheet for Healthcare Providers: IncredibleEmployment.be  This test is not yet approved or cleared by the [place] FDA and has been authorized for detection and/or diagnosis of SARS-CoV-2 by FDA under an Emergency Use Authorization (EUA). This EUA will remain in effect (meaning this test can be used) for the duration of the COVID-19 declaration under Section 564(b)(1) of the Act, 21 U.S.C. section 360bbb-3(b)(1), unless the authorization is terminated or revoked.  Performed at [facility], [place] [address],  [place], [place] [address]   MRSA Next Gen by PCR, Nasal     Status: None   Collection Time: 11/04/20 12:37 AM   Specimen: Nasal Mucosa; Nasal Swab  Result Value Ref Range Status   MRSA by PCR Next Gen NOT DETECTED NOT DETECTED Final    Comment: (NOTE) The GeneXpert MRSA Assay (FDA approved for NASAL specimens only), is one component of a comprehensive MRSA colonization surveillance program. It is not intended to diagnose MRSA infection nor to guide or monitor treatment for MRSA infections. Test performance is not FDA approved in patients less than 66 years old. Performed at [facility], [place] [address] [place],  [address]     Lab Basic Metabolic Panel: Recent Labs  Lab 10/29/2020 [phone] 10/13/2020 0732 10/31/2020 1450 10/13/2020 2022 11/04/20 0215 11/04/20 0736 11/04/20 1431  NA 132* 134* 137 138 138 140 142  K 4.1 4.1  --   --   --   --   --   CL 100 100  --   --   --   --   --   CO2 26  --   --   --   --   --   --   GLUCOSE 114* 113*  --   --   --   --   --   BUN 16 17  --   --   --   --   --   CREATININE 0.98 1.00  --   --   --   --   --   CALCIUM 8.8*  --   --   --   --   --   --  Liver Function Tests: Recent Labs  Lab 10/23/2020 0727  AST 19  ALT 18  ALKPHOS 79  BILITOT 0.7  PROT 5.9*  ALBUMIN 3.4*   No results for input(s): LIPASE, AMYLASE in the last 168 hours. No results for input(s): AMMONIA in the last 168 hours. CBC: Recent Labs  Lab 10/27/2020 0727 10/10/2020 0732  WBC 9.8  --   NEUTROABS 6.1  --   HGB 14.3 13.6  HCT 41.6 40.0  MCV 88.5  --   PLT 272  --    Cardiac Enzymes: No results for input(s): CKTOTAL, CKMB, CKMBINDEX, TROPONINI in the last 168 hours. Sepsis Labs: Recent Labs  Lab 10/24/2020 0727  WBC 9.8    Procedures/Operations  none  Samyria Rudie Metzger-Cihelka Nov 08, 2020, 4:19 PM

## 2020-11-07 NOTE — Progress Notes (Signed)
STROKE TEAM PROGRESS NOTE   INTERVAL HISTORY His wife and dtrs are at the bedside. He is resting more comfortably at this time on dilaudid drip. Family request CMO at this time and he is a DNR/DNI. Death is Administrator, arts. D/w case mgt and did consult with [facility] for inpt hospice care, but they feel he may not make the transport, thus will transfer to floor.  Vitals:   11/28/20 1200 11-28-20 1300 11/28/20 1400 Nov 28, 2020 1500  BP:      Pulse: (!) 101 (!) 101 (!) 107 (!) 110  Resp: '17 19 16 '$ (!) 21  Temp: (!) 102 F (38.9 C)     TempSrc: Axillary     SpO2: (!) 77% (!) 77% (!) 78% (!) 72%  Weight:      Height:       CBC:  Recent Labs  Lab 10/22/2020 0727 10/07/2020 0732  WBC 9.8  --   NEUTROABS 6.1  --   HGB 14.3 13.6  HCT 41.6 40.0  MCV 88.5  --   PLT 272  --     Basic Metabolic Panel:  Recent Labs  Lab 10/23/2020 0727 11/04/2020 0732 10/14/2020 1450 11/04/20 0736 11/04/20 1431  NA 132* 134*   < > 140 142  K 4.1 4.1  --   --   --   CL 100 100  --   --   --   CO2 26  --   --   --   --   GLUCOSE 114* 113*  --   --   --   BUN 16 17  --   --   --   CREATININE 0.98 1.00  --   --   --   CALCIUM 8.8*  --   --   --   --    < > = values in this interval not displayed.    Lipid Panel:  Recent Labs  Lab 11/01/2020 1450  CHOL 154  TRIG 37  HDL 59  CHOLHDL 2.6  VLDL 7  LDLCALC 88    HgbA1c: No results for input(s): HGBA1C in the last 168 hours. Urine Drug Screen: No results for input(s): LABOPIA, COCAINSCRNUR, LABBENZ, AMPHETMU, THCU, LABBARB in the last 168 hours.  Alcohol Level No results for input(s): ETH in the last 168 hours.  IMAGING past 24 hours No results found.  PHYSICAL EXAM General: Appears well-developed, acute distress, critically ill. Psych: Affect appropriate to situation; unable Eyes: No scleral injection HENT: No OP obstrucion Head: Normocephalic.  Cardiovascular: Irrg Respiratory: appears to be in acute respiratory distress, rapid and irreg shallow  breathing, lungs are with course crackles and rhonchi throughout. NRB placed GI: Soft.  No distension. There is no tenderness.  Skin: WDI    Neurological Examination Mental Status: Lethargic, barely alerts to voice + tactile, poorly attends, did squeeze hand on right briefly. He has a right gaze and left hemiparesis and decreased sensation to noxious stimuli on left. limited exam out of respect for comfort care.   ASSESSMENT/PLAN [name] is a 80 y.o. 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  ICH secondary to HTN Code Stroke CT head No acute abnormality. Small vessel disease. Atrophy.Large frontal hemorrhage with some IVH noted 2D Echo penidng LDL 88 HgbA1c No results found for requested labs within last 26280 hours. VTE prophylaxis - SCDs d/t bleed    Diet  Diet NPO time specified   aspirin 81 mg daily prior to admission, now on  none .  Therapy recommendations:  CMO Disposition:  Hospice  Hypertension Home meds:  none Unstable SBP goal <140, Cleviprex gtt started Long-term BP goal normotensive  Hyperlipidemia Home meds:  lipitor '20mg'$ , resumed in hospital LDL 88, goal < 70 High intensity statin not indicated  Diabetes type II no dx HgbA1c No results found for requested labs within last 26280 hours., goal < 7.0 CBGs Recent Labs    10/31/2020 0725  GLUCAP 56*     SSI  Other Stroke Risk Factors Advanced Age >/= 59  Dementia Afib OSA CAD with h/o Cardiac arrest Former tobacco smoker  Other Active Problems Acute respiratory failure- but DNR/DNI GOC discussion: Family appropriately want to move to [place] at this time. Comfort care ordered and Dilaudid gtt ordered. No further aggressive care. Death is Administrator, arts; does not look to be stable for transport. Will transfer to floor. Updated w/RN and case mgt.  Hospital day #  2  Markail Diekman Metzger-Cihelka, ARNP-C, ANVP-BC Pager: [phone]  Alexiana Laverdure Metzger-Cihelka, ARNP-C, ANVP-BC Pager: [phone]  Stroke Neurology 2020/11/08 3:23 PM    To contact Stroke Continuity provider, please refer to [email or web address] After hours, contact General Neurology

## 2020-11-07 NOTE — Telephone Encounter (Signed)
Will send to Dr Young as FYI.  

## 2020-11-07 NOTE — Progress Notes (Signed)
   03-Dec-2020 1139  Clinical Encounter Type  Visited With Other (Comment)  Visit Type Spiritual support;Follow-up  Referral From Nurse  Consult/Referral To Chaplain   I informed Nurse, Danise Mina that Father Barnabas Lister had called. He said he would visit the patient at 3 pm. This note was prepared by Jeanine Luz, M.Div..  For questions please contact by phone [phone].

## 2020-11-07 NOTE — TOC Initial Note (Signed)
Transition of Care [facility]) - Initial/Assessment Note    Patient Details  Name: Benjamin George MRN: [id number] Date of Birth: 12/21/1940  [facility] CM/SW Contact:    Bartholomew Crews, RN Phone Number: [phone] 2020/11/18, 3:24 PM  Clinical Narrative:                  Received request to discuss hospice option with wife and daughter. Spoke with wife, Sheffield Slider, and daughter at bedside. They are interested in residential hospital stating they cannot provide for him at home; however, they are concerned about his ability to transport to inpatient facility. Discussed moving forward with referral process for residential hospice - wife agreeable. Referral placed - hospice liaison expressed concerns about deteriorating status. Anticipate patient to transition out of ICU for comfort care/end of life needs. TOC following for transition needs.   Expected Discharge Plan: [place] (versus hospital death) Barriers to Discharge: Other (must enter comment) (inpatient hospice bed vs hospital death)   Patient Goals and CMS Choice Patient states their goals for this hospitalization and ongoing recovery are:: end of life comfort care CMS Medicare.gov Compare Post Acute Care list provided to:: Patient Represenative (must comment) (Ruthanne (Spouse)) Choice offered to / list presented to : Spouse  Expected Discharge Plan and Services Expected Discharge Plan: [place] (versus hospital death) In-house Referral: Hospice / Palliative Care Discharge Planning Services: CM Consult Post Acute Care Choice: Hospice Living arrangements for the past 2 months: Single Family Home                 DME Arranged: N/A DME Agency: NA       HH Arranged: NA HH Agency: [facility] and [place] Date [place]: 2020/11/18 Time HH Agency Contacted: 1324 Representative spoke with at [place]: [place]  Prior Living Arrangements/Services Living  arrangements for the past 2 months: [place] Lives with:: Spouse, Self                   Activities of Daily Living      Permission Sought/Granted                  Emotional Assessment              Admission diagnosis:  Hemorrhagic stroke ([place]) [I61.9] ICH (intracerebral hemorrhage) ([place]) [I61.9] Patient Active Problem List   Diagnosis Date Noted   ICH (intracerebral hemorrhage) ([place]) 10/17/2020   AMS (altered mental status) 06/18/2020   Altered mental status 06/17/2020   Pneumonia due to COVID-19 virus 05/08/2020   Encephalopathy due to COVID-19 virus 05/08/2020   Dementia ([place]) 05/08/2020   Decreased ambulation status 05/07/2020   Impaired ambulation 05/07/2020   Fever    Weakness generalized    Hypotension    CAP (community acquired pneumonia) 05/13/2018   Community acquired pneumonia of left lower lobe of lung 05/12/2018   Ischemic cardiomyopathy 04/18/2017   Memory disorder 12/21/2015   Spinal stenosis 05/06/2013   Palpitations 03/02/2013   Morbid obesity ([place]) 12/12/2011   Arthritis of shoulder region, 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   CAD (coronary artery disease) 10/24/2010   Preoperative evaluation to rule out surgical contraindication 10/24/2010   Seasonal and perennial allergic rhinitis 05/17/2010   INSOMNIA 05/17/2010   Hyperlipidemia LDL goal <70 04/13/2009   GOUT 04/13/2009   Obstructive sleep apnea 04/13/2009   Essential hypertension 04/13/2009   HEART ATTACK 04/13/2009   CARDIAC ARREST 04/13/2009   PCP:  [facility] [place]  Medical Pharmacy:   [id number] [place] [id number]: [phone]: [phone]    Social Determinants of Health (SDOH) Interventions    Readmission Risk Interventions Readmission Risk Prevention Plan 05/09/2020  Post Dischage Appt Complete  Medication Screening Complete  Transportation  Screening Complete  Some recent data might be hidden

## 2020-11-07 NOTE — Progress Notes (Signed)
Father Barnabas Lister here to administer Last Rites

## 2020-11-07 NOTE — Progress Notes (Signed)
Manufacturing engineer [facility]) Hospital Liaison note.    Received request from [place] for family interest in [facility]. Chart and pt information under review by [facility] physician.  Hospice eligibility pending at this time.  Hospital Liaison spoke by phone with pt's spouse, Danae Chen, who would like liaison to visit at bedside to discuss process for transfer to inpatient hospice. Plan made to meet at bedside this afternoon. This liaison will follow up tomorrow or sooner if family wishes to pursue transfer to [facility].   Please do not hesitate to call with questions.    Thank you for the opportunity to participate in this patient's care.  Domenic Moras, BSN, RN [facility] Liaison (listed on [place] under Hospice/[facility])    [phone] (24h on call)

## 2020-11-07 NOTE — Progress Notes (Signed)
   11-28-20 1004  Clinical Encounter Type  Visited With Other (Comment) (Spoke with Nurse,Jenn)  Visit Type Spiritual support  Referral From Nurse  Consult/Referral To Chaplain   Chaplain responded. I spoke with Nurse, Danise Mina, and said the patient's family is requesting [facility]. I called and left a message for Father Barnabas Lister. I called Jenn to provide updates and encourage the family to contact their local parish as well. Chaplain available to provide support if needed. This note was prepared by Jeanine Luz, M.Div..  For questions please contact by phone [phone].

## 2020-11-07 DEATH — deceased

## 2021-01-05 ENCOUNTER — Ambulatory Visit: Payer: Medicare Other | Admitting: Adult Health

## 2021-07-13 ENCOUNTER — Ambulatory Visit: Payer: Medicare Other | Admitting: Internal Medicine

## 2022-02-17 IMAGING — MR MR LUMBAR SPINE W/O CM
4 of 5 series · 26 of 48 positions shown · non-contrast
Comparison: MRI lumbar spine 02/27/2015

CLINICAL DATA: Chronic low back pain.

EXAM:
MRI LUMBAR SPINE WITHOUT CONTRAST
TECHNIQUE: Multiplanar, multisequence MR imaging of the lumbar spine was
performed. No intravenous contrast was administered.
MRI conditional spinal cord stimulator placed and MRI mode for scan.
No complications.

[Series 9: T2 · sagittal · 4.0mm · 0.73mm/px · 6 of 16 slices shown (1 of 2)]
[im 1/16]
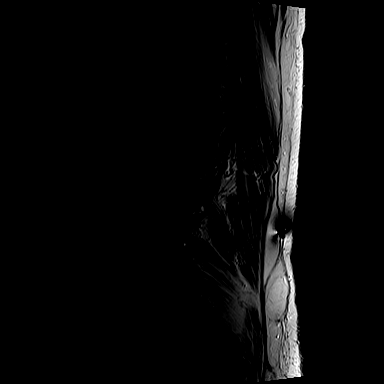
[im 4/16]
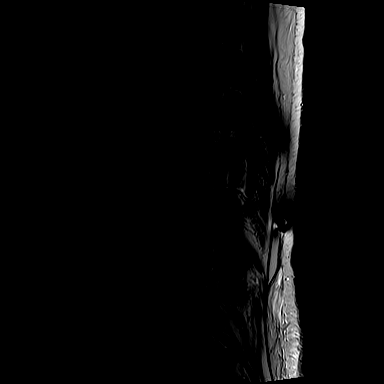
[im 7/16]
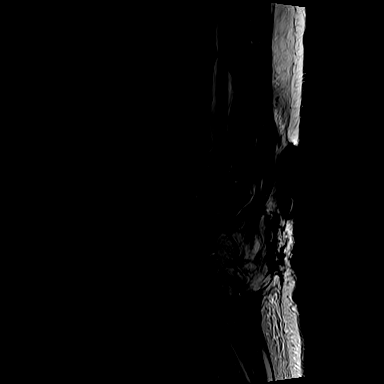
[im 10/16]
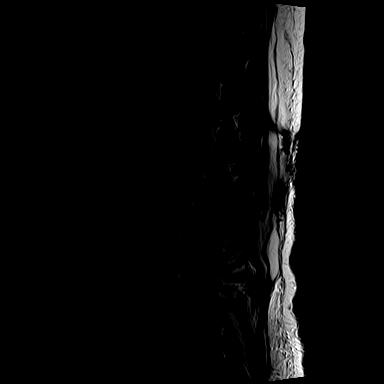
[im 13/16]
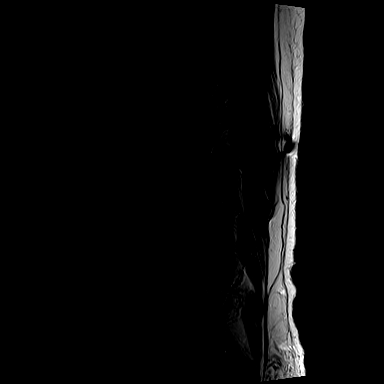
[im 16/16]
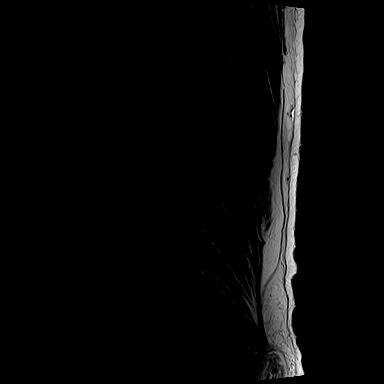

[Series 11: T1 · sagittal · 4.0mm · 0.88mm/px · 7 of 16 slices shown (1 of 2)]
[im 1/16]
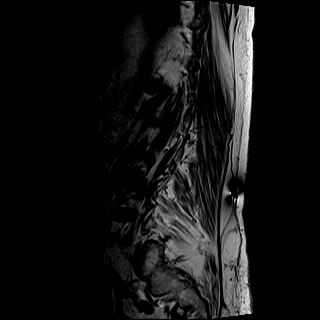
[im 3/16]
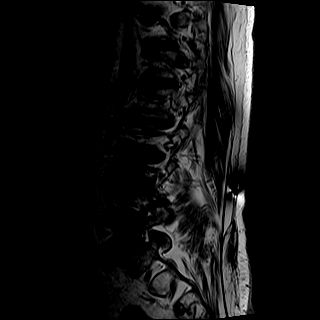
[im 6/16]
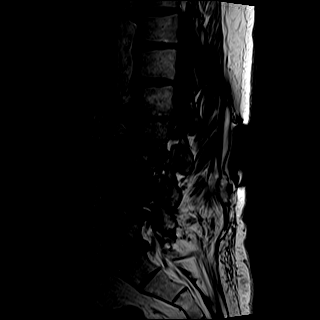
[im 8/16]
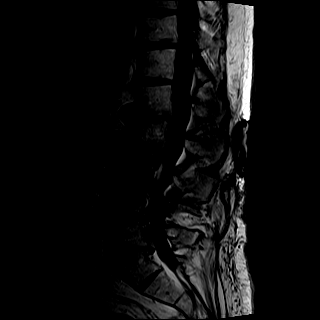
[im 11/16]
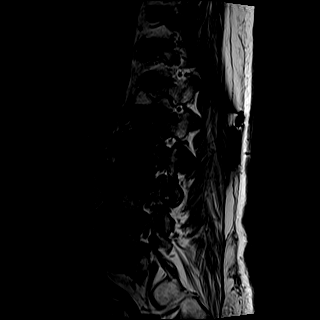
[im 13/16]
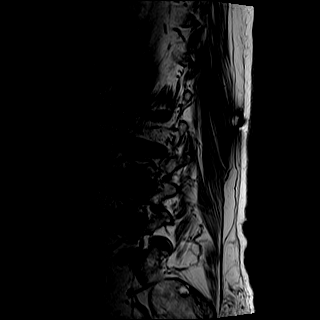
[im 16/16]
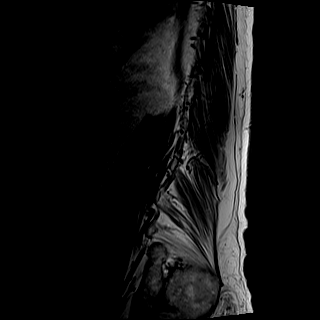

[Series 12: T2 · axial · 4.0mm · 0.57mm/px · z∈[-146,+55]mm · 8 of 34 slices shown (2 of 2)]
[im 1/34]
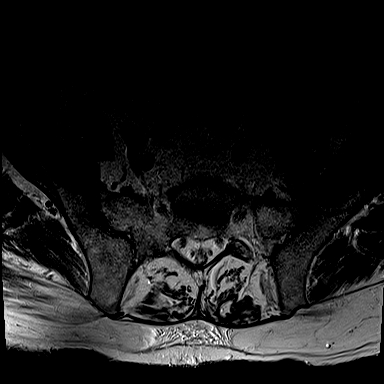
[im 6/34]
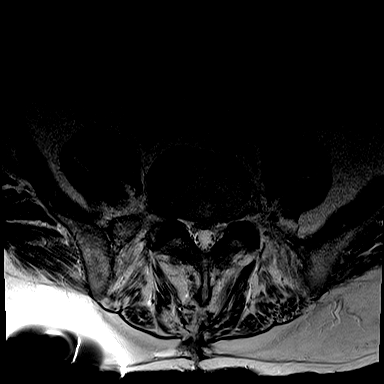
[im 11/34]
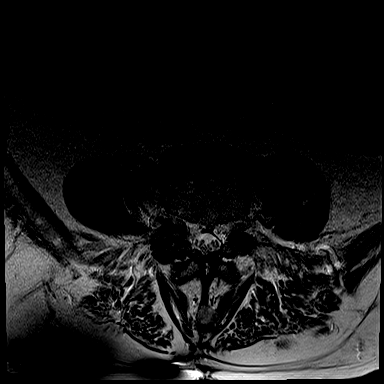
[im 16/34]
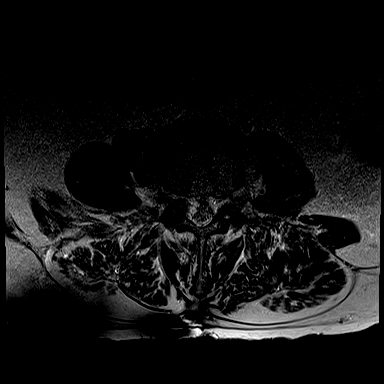
[im 18/34]
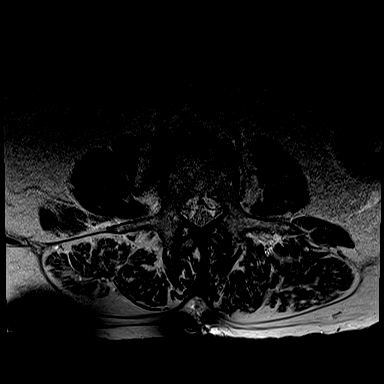
[im 23/34]
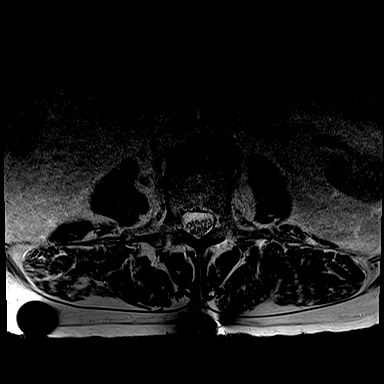
[im 28/34]
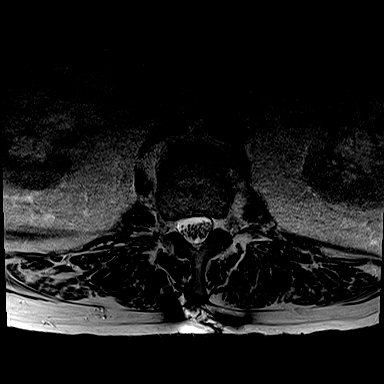
[im 34/34]
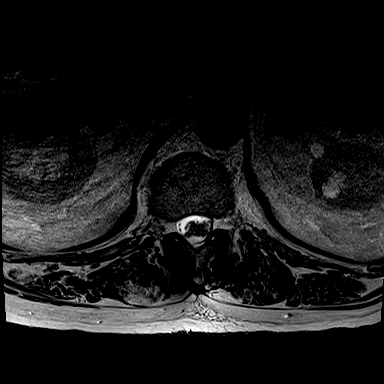

[Series 13: T1 · axial · 4.0mm · 0.34mm/px · z∈[-152,+33]mm · 5 of 33 slices shown (2 of 2)]
[im 1/33]
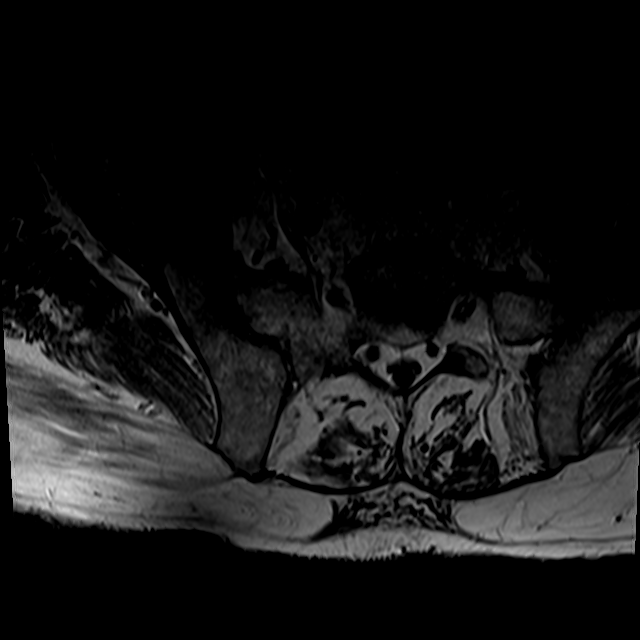
[im 5/33]
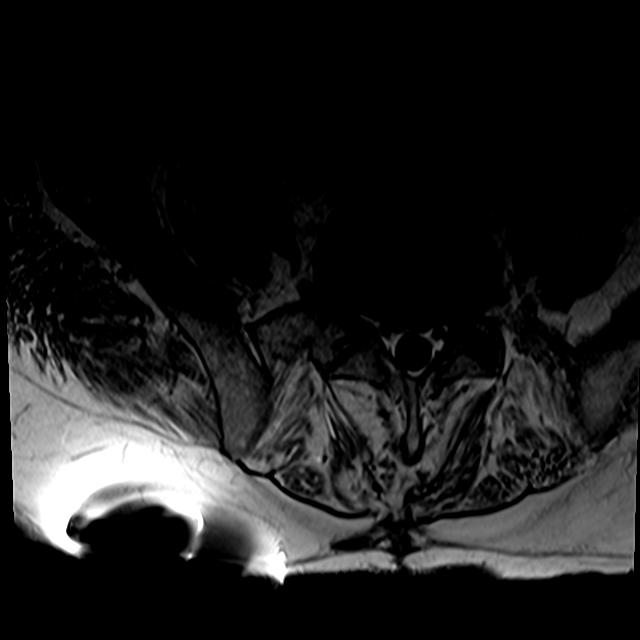
[im 10/33]
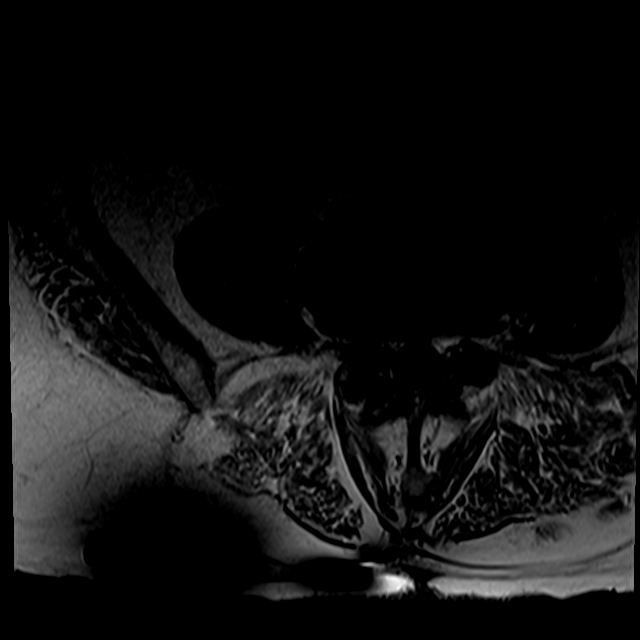
[im 18/33]
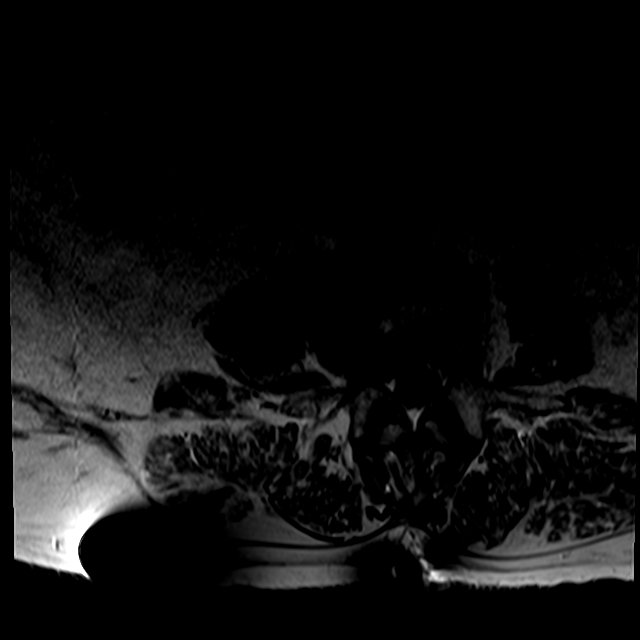
[im 28/33]
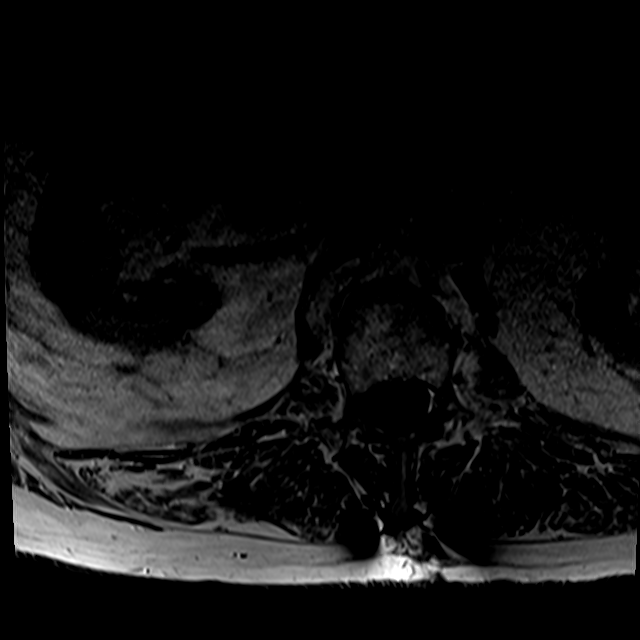

[26 of 48 positions shown; findings below may reference images not displayed]

FINDINGS: Segmentation:  Normal

Alignment: Mild retrolisthesis T11-12, L1-2, L2-3, L3-4, L4-5, L5-S1

Vertebrae: Negative for fracture or mass. Discogenic edema in the
bone marrow at L3-4 has progressed.

Conus medullaris and cauda equina: Conus extends to the L1-2 level.
Conus and cauda equina appear normal.

Paraspinal and other soft tissues: Negative for paraspinous mass or
adenopathy.

Disc levels:

T11-12: Interval placement of spinal cord stimulator entering the
canal at this level. There is small left-sided disc protrusion at
T11-12 with associated endplate spurring and mild spinal stenosis

T12-L1: Mild degenerative change without stenosis

L1-2: Advanced disc degeneration with near-complete loss of disc
height. Diffuse endplate spurring. Mild subarticular stenosis
bilaterally.

L2-3: Severe disc degeneration with retrolisthesis and diffuse
endplate spurring. Mild spinal stenosis and moderate subarticular
stenosis bilaterally. Mild facet degeneration bilaterally.

L3-4: Advanced disc degeneration with diffuse endplate spurring.
Bilateral facet degeneration. Moderate spinal stenosis and moderate
subarticular and foraminal stenosis bilaterally.

L4-5: Disc degeneration with diffuse endplate spurring. Bilateral
laminectomy. Spinal canal well decompressed. There is moderate to
severe subarticular and foraminal stenosis on the right. Mild
subarticular stenosis on the left.

L5-S1: Disc degeneration with prominent endplate spurring diffusely.
Mild facet degeneration. Moderate subarticular stenosis bilaterally.
IMPRESSION: Spinal cord stimulator enters the canal at T11-12. There is disc
degeneration and left-sided disc protrusion at this level with mild
spinal stenosis

Laminectomy L4-5. Moderate to severe subarticular foraminal stenosis
on the right at L4-5.

Disc degeneration and spondylosis throughout the lumbar spine as
above.
